# Patient Record
Sex: Male | Born: 1965 | Race: White | Hispanic: No | Marital: Married | Smoking: Never smoker
Health system: Southern US, Community
[De-identification: ages and names within clinical notes are randomized; demographics above are authoritative.]

## PROBLEM LIST (undated history)

## (undated) DIAGNOSIS — F411 Generalized anxiety disorder: Secondary | ICD-10-CM

## (undated) DIAGNOSIS — M7650 Patellar tendinitis, unspecified knee: Secondary | ICD-10-CM

## (undated) DIAGNOSIS — M25519 Pain in unspecified shoulder: Secondary | ICD-10-CM

## (undated) DIAGNOSIS — J309 Allergic rhinitis, unspecified: Secondary | ICD-10-CM

## (undated) DIAGNOSIS — Z8719 Personal history of other diseases of the digestive system: Secondary | ICD-10-CM

## (undated) DIAGNOSIS — K219 Gastro-esophageal reflux disease without esophagitis: Secondary | ICD-10-CM

## (undated) DIAGNOSIS — Z9889 Other specified postprocedural states: Secondary | ICD-10-CM

## (undated) DIAGNOSIS — J302 Other seasonal allergic rhinitis: Secondary | ICD-10-CM

## (undated) DIAGNOSIS — F528 Other sexual dysfunction not due to a substance or known physiological condition: Secondary | ICD-10-CM

## (undated) DIAGNOSIS — R112 Nausea with vomiting, unspecified: Secondary | ICD-10-CM

## (undated) DIAGNOSIS — M549 Dorsalgia, unspecified: Secondary | ICD-10-CM

## (undated) DIAGNOSIS — J45909 Unspecified asthma, uncomplicated: Secondary | ICD-10-CM

## (undated) DIAGNOSIS — M542 Cervicalgia: Secondary | ICD-10-CM

## (undated) DIAGNOSIS — I10 Essential (primary) hypertension: Secondary | ICD-10-CM

## (undated) DIAGNOSIS — E785 Hyperlipidemia, unspecified: Secondary | ICD-10-CM

## (undated) DIAGNOSIS — S335XXA Sprain of ligaments of lumbar spine, initial encounter: Secondary | ICD-10-CM

## (undated) DIAGNOSIS — F1910 Other psychoactive substance abuse, uncomplicated: Secondary | ICD-10-CM

## (undated) HISTORY — DX: Personal history of other diseases of the digestive system: Z87.19

## (undated) HISTORY — DX: Generalized anxiety disorder: F41.1

## (undated) HISTORY — DX: Other seasonal allergic rhinitis: J30.2

## (undated) HISTORY — DX: Unspecified asthma, uncomplicated: J45.909

## (undated) HISTORY — PX: KNEE SURGERY: SHX244

## (undated) HISTORY — DX: Allergic rhinitis, unspecified: J30.9

## (undated) HISTORY — DX: Cervicalgia: M54.2

## (undated) HISTORY — DX: Other psychoactive substance abuse, uncomplicated: F19.10

## (undated) HISTORY — DX: Other sexual dysfunction not due to a substance or known physiological condition: F52.8

## (undated) HISTORY — DX: Essential (primary) hypertension: I10

## (undated) HISTORY — DX: Gastro-esophageal reflux disease without esophagitis: K21.9

## (undated) HISTORY — DX: Sprain of ligaments of lumbar spine, initial encounter: S33.5XXA

## (undated) HISTORY — DX: Pain in unspecified shoulder: M25.519

## (undated) HISTORY — DX: Patellar tendinitis, unspecified knee: M76.50

## (undated) HISTORY — DX: Hyperlipidemia, unspecified: E78.5

## (undated) HISTORY — DX: Dorsalgia, unspecified: M54.9

---

## 1998-01-16 ENCOUNTER — Emergency Department (HOSPITAL_COMMUNITY): Admission: EM | Admit: 1998-01-16 | Discharge: 1998-01-16 | Payer: Self-pay | Admitting: Emergency Medicine

## 2000-03-17 ENCOUNTER — Encounter: Payer: Self-pay | Admitting: Emergency Medicine

## 2000-03-17 ENCOUNTER — Emergency Department (HOSPITAL_COMMUNITY): Admission: EM | Admit: 2000-03-17 | Discharge: 2000-03-17 | Payer: Self-pay | Admitting: Emergency Medicine

## 2005-07-11 ENCOUNTER — Ambulatory Visit: Payer: Self-pay | Admitting: Family Medicine

## 2006-06-09 ENCOUNTER — Ambulatory Visit: Payer: Self-pay | Admitting: Internal Medicine

## 2006-06-09 ENCOUNTER — Inpatient Hospital Stay (HOSPITAL_COMMUNITY): Admission: EM | Admit: 2006-06-09 | Discharge: 2006-06-14 | Payer: Self-pay | Admitting: Emergency Medicine

## 2006-06-17 ENCOUNTER — Ambulatory Visit: Payer: Self-pay | Admitting: Family Medicine

## 2006-06-19 ENCOUNTER — Ambulatory Visit: Payer: Self-pay | Admitting: Internal Medicine

## 2006-06-25 ENCOUNTER — Ambulatory Visit: Payer: Self-pay | Admitting: Licensed Clinical Social Worker

## 2006-07-01 ENCOUNTER — Ambulatory Visit: Payer: Self-pay | Admitting: Family Medicine

## 2006-07-15 ENCOUNTER — Ambulatory Visit: Payer: Self-pay | Admitting: Family Medicine

## 2006-07-22 ENCOUNTER — Ambulatory Visit: Payer: Self-pay | Admitting: Internal Medicine

## 2006-08-13 ENCOUNTER — Ambulatory Visit: Payer: Self-pay | Admitting: Family Medicine

## 2006-09-10 HISTORY — PX: KNEE ARTHROSCOPY: SUR90

## 2006-09-12 ENCOUNTER — Ambulatory Visit: Payer: Self-pay | Admitting: Internal Medicine

## 2006-09-12 ENCOUNTER — Encounter (INDEPENDENT_AMBULATORY_CARE_PROVIDER_SITE_OTHER): Payer: Self-pay | Admitting: Specialist

## 2006-09-17 ENCOUNTER — Ambulatory Visit: Payer: Self-pay | Admitting: Family Medicine

## 2006-10-23 ENCOUNTER — Ambulatory Visit: Payer: Self-pay | Admitting: Family Medicine

## 2007-05-13 DIAGNOSIS — J309 Allergic rhinitis, unspecified: Secondary | ICD-10-CM | POA: Insufficient documentation

## 2007-05-13 DIAGNOSIS — K219 Gastro-esophageal reflux disease without esophagitis: Secondary | ICD-10-CM | POA: Insufficient documentation

## 2007-06-10 ENCOUNTER — Ambulatory Visit: Payer: Self-pay | Admitting: Family Medicine

## 2007-07-21 ENCOUNTER — Ambulatory Visit: Payer: Self-pay | Admitting: Family Medicine

## 2007-10-01 ENCOUNTER — Ambulatory Visit: Payer: Self-pay | Admitting: Internal Medicine

## 2007-10-08 ENCOUNTER — Ambulatory Visit: Payer: Self-pay | Admitting: Family Medicine

## 2007-10-08 LAB — CONVERTED CEMR LAB
ALT: 49 units/L (ref 0–53)
Basophils Relative: 0.1 % (ref 0.0–1.0)
Bilirubin Urine: NEGATIVE
Bilirubin, Direct: 0.2 mg/dL (ref 0.0–0.3)
Blood in Urine, dipstick: NEGATIVE
CO2: 30 meq/L (ref 19–32)
Calcium: 9.6 mg/dL (ref 8.4–10.5)
Direct LDL: 149.8 mg/dL
Eosinophils Absolute: 0.2 10*3/uL (ref 0.0–0.6)
Eosinophils Relative: 3.7 % (ref 0.0–5.0)
GFR calc Af Amer: 106 mL/min
GFR calc non Af Amer: 88 mL/min
Glucose, Bld: 101 mg/dL — ABNORMAL HIGH (ref 70–99)
Glucose, Urine, Semiquant: 100
HCT: 45.4 % (ref 39.0–52.0)
Hemoglobin: 15.8 g/dL (ref 13.0–17.0)
Ketones, urine, test strip: NEGATIVE
Lymphocytes Relative: 25.4 % (ref 12.0–46.0)
MCV: 85.8 fL (ref 78.0–100.0)
Monocytes Absolute: 0.4 10*3/uL (ref 0.2–0.7)
Neutro Abs: 3.1 10*3/uL (ref 1.4–7.7)
Neutrophils Relative %: 63.2 % (ref 43.0–77.0)
Nitrite: NEGATIVE
Platelets: 160 10*3/uL (ref 150–400)
Protein, U semiquant: NEGATIVE
Sodium: 137 meq/L (ref 135–145)
Specific Gravity, Urine: >=1.03
Total Protein: 6.8 g/dL (ref 6.0–8.3)
Urobilinogen, UA: 0.2
VLDL: 39 mg/dL (ref 0–40)
WBC Urine, dipstick: NEGATIVE
WBC: 5 10*3/uL (ref 4.5–10.5)
pH: 5

## 2007-10-16 ENCOUNTER — Ambulatory Visit: Payer: Self-pay | Admitting: Family Medicine

## 2007-10-16 DIAGNOSIS — I10 Essential (primary) hypertension: Secondary | ICD-10-CM | POA: Insufficient documentation

## 2007-10-24 IMAGING — CT CT PELVIS W/ CM
2 of 5 series · 17 of 46 positions shown, 19 images · IV contrast (omnipaque)
Comparison: none

CLINICAL DATA: 40 year-old male with acute, severe, epigastric abdominal pain.  
ABDOMEN CT WITH CONTRAST:
TECHNIQUE: Multidetector CT imaging of the abdomen was performed following the standard protocol during bolus administration of intravenous contrast.
Contrast:  125 cc Omnipaque 300
TECHNIQUE: Multidetector CT imaging of the pelvis was performed following the standard protocol during bolus administration of intravenous contrast.
Free fluid extends into the pelvis dependently.  The distal colon is decompressed.  No adenopathy, bowel abnormality, or additional pelvic finding.

[Series 2: abd_pel 5.0 b40f st · axial · 0.73mm/px · z∈[-478,-23]mm · 14 of 103 slices shown, 16 images]
[im 6/103  soft-tissue]
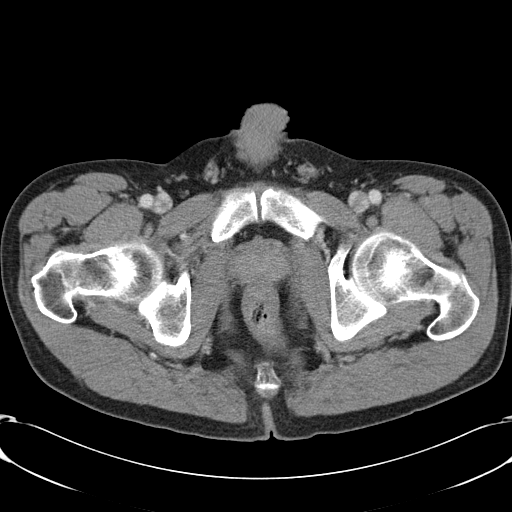
[im 6/103  bone]
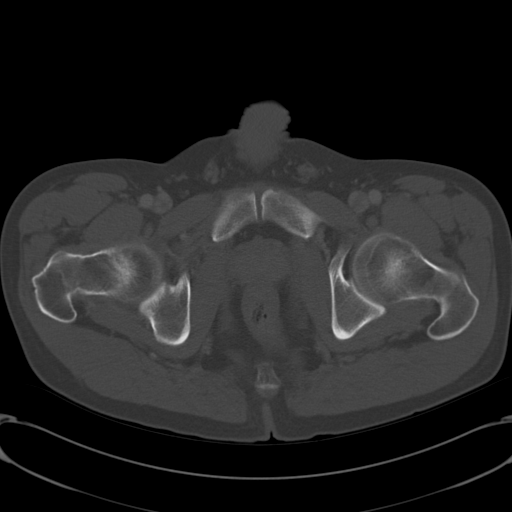
[im 11/103  soft-tissue]
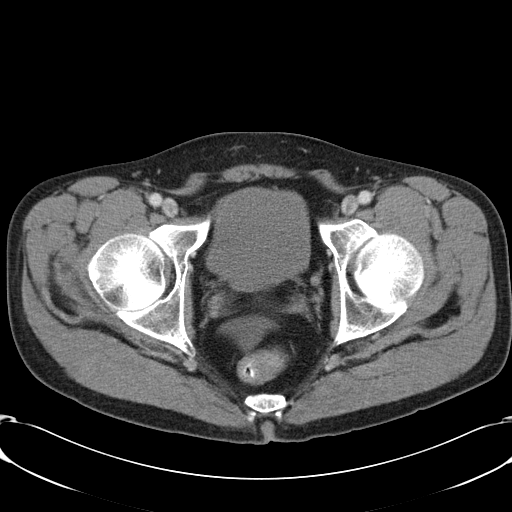
[im 22/103  soft-tissue]
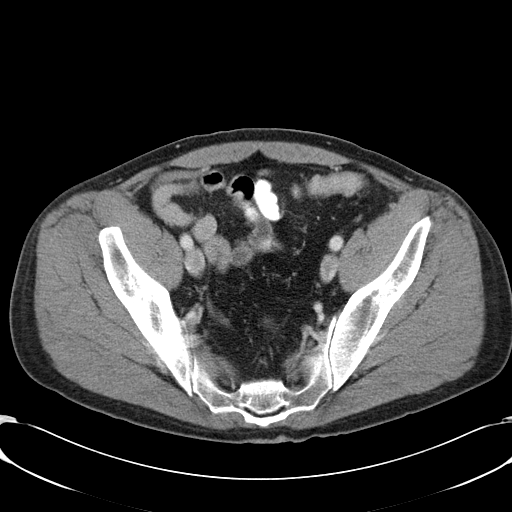
[im 27/103  soft-tissue]
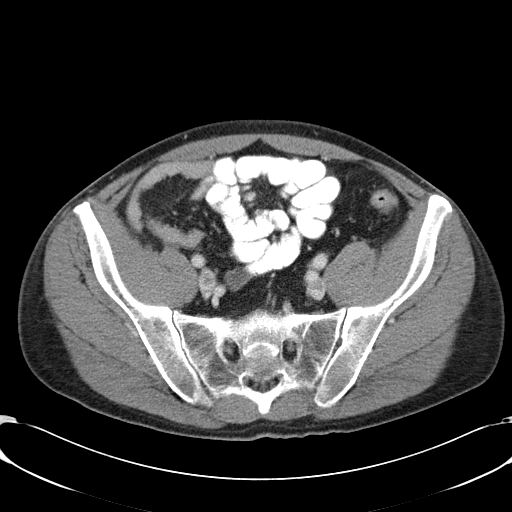
[im 33/103  soft-tissue]
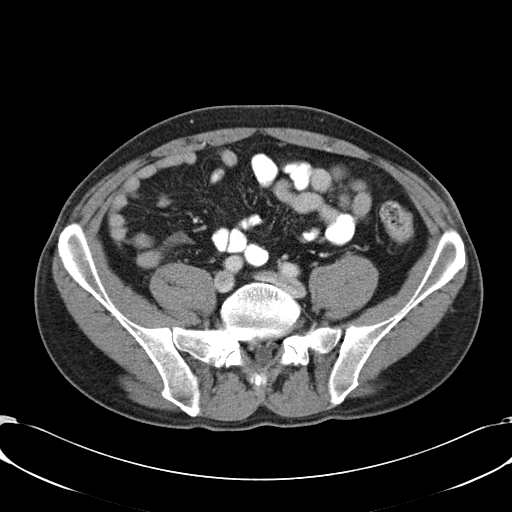
[im 43/103  soft-tissue]
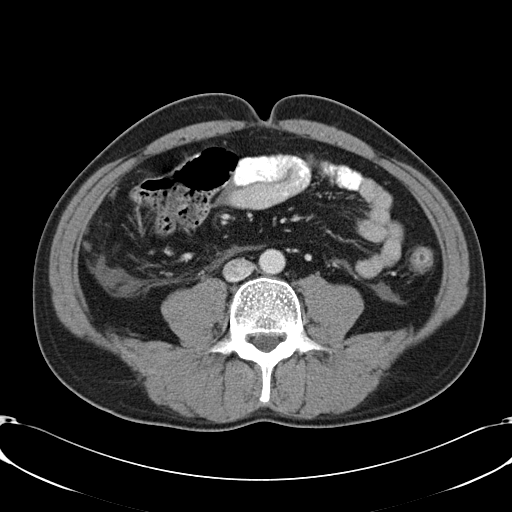
[im 49/103  soft-tissue]
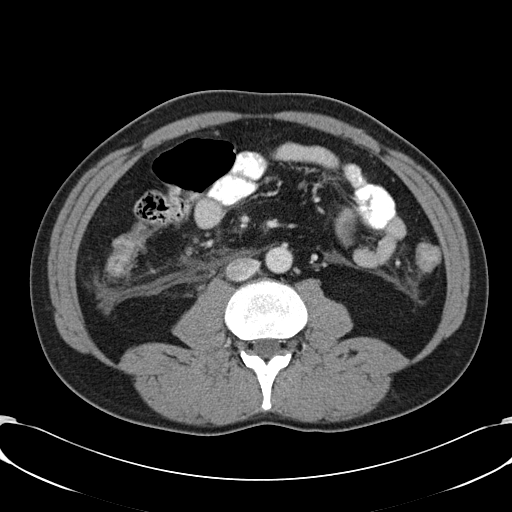
[im 54/103  soft-tissue]
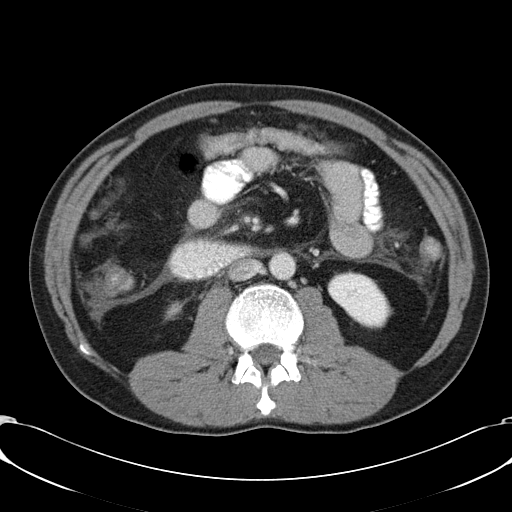
[im 60/103  soft-tissue]
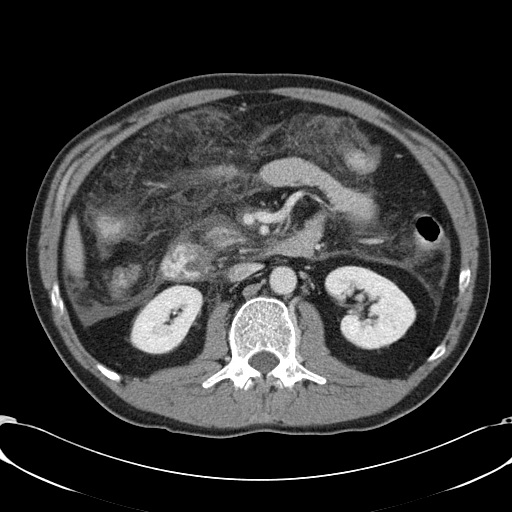
[im 60/103  bone]
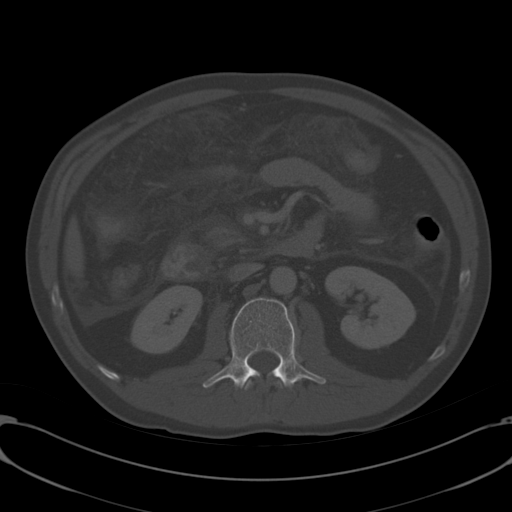
[im 70/103  soft-tissue]
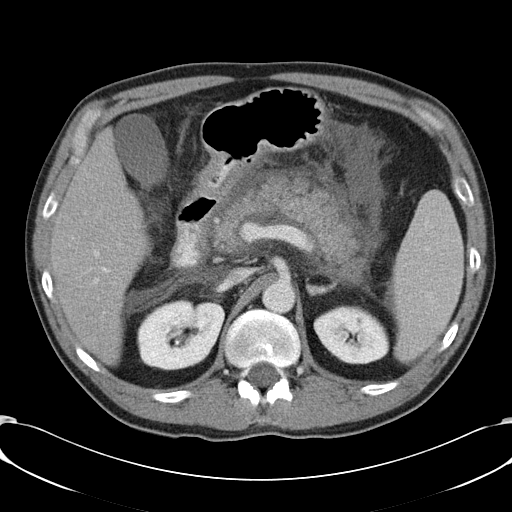
[im 76/103  soft-tissue]
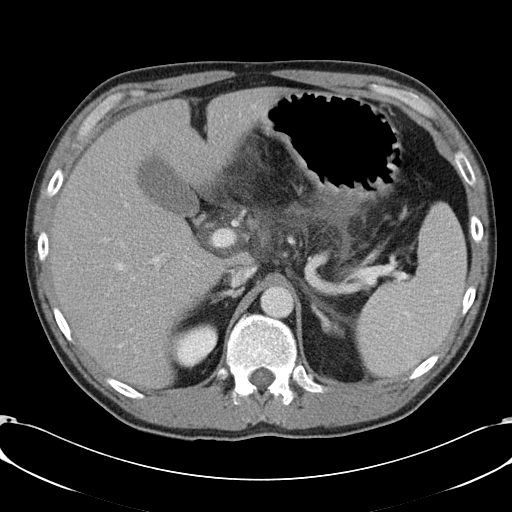
[im 81/103  soft-tissue]
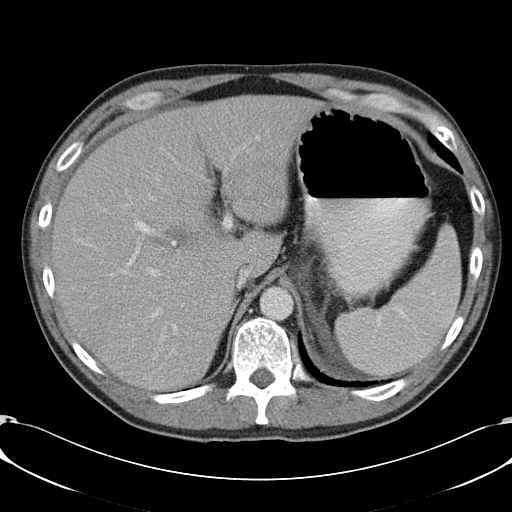
[im 92/103  soft-tissue]
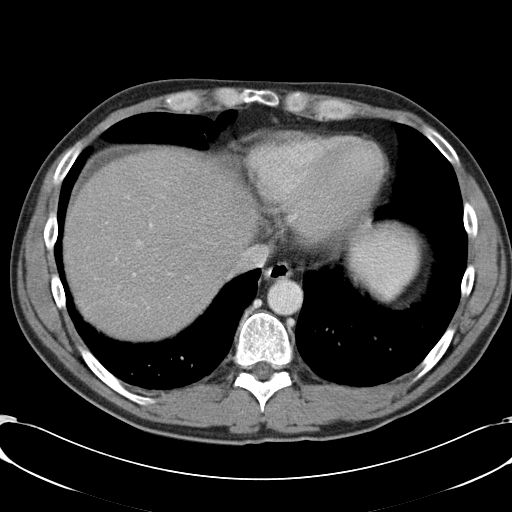
[im 97/103  soft-tissue]
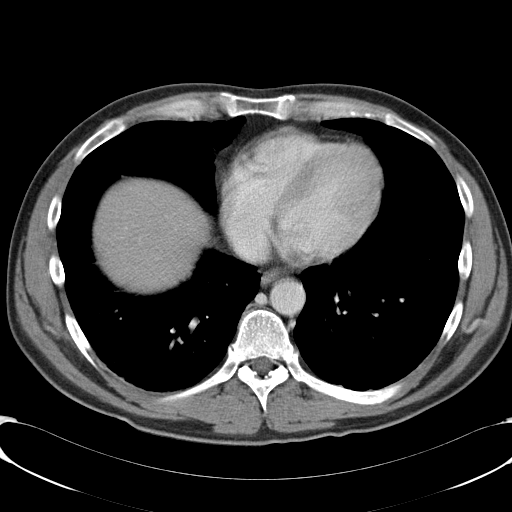

[Series 602: coronal abdomen · coronal · 1.04mm/px · 3 of 131 slices shown]
[im 44/131  soft-tissue]
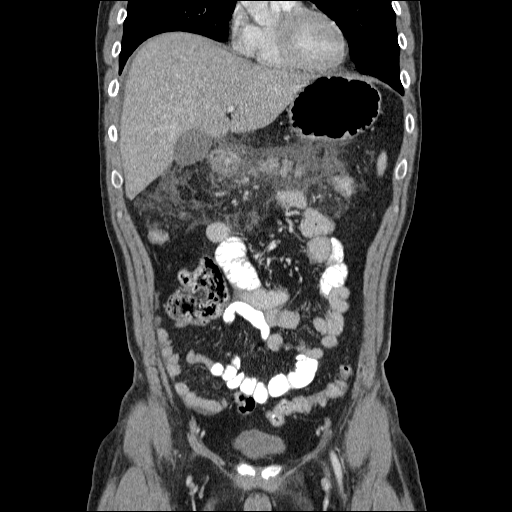
[im 58/131  soft-tissue]
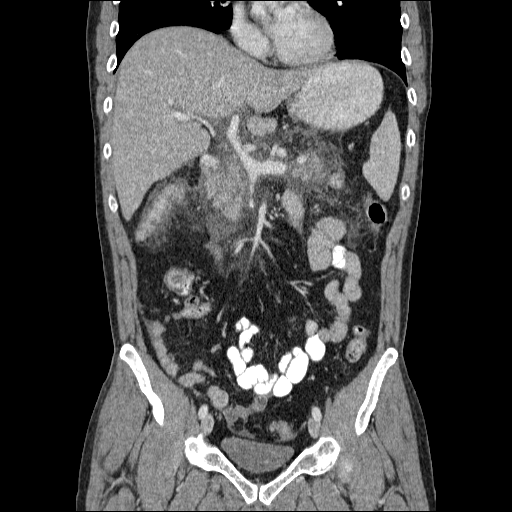
[im 73/131  soft-tissue]
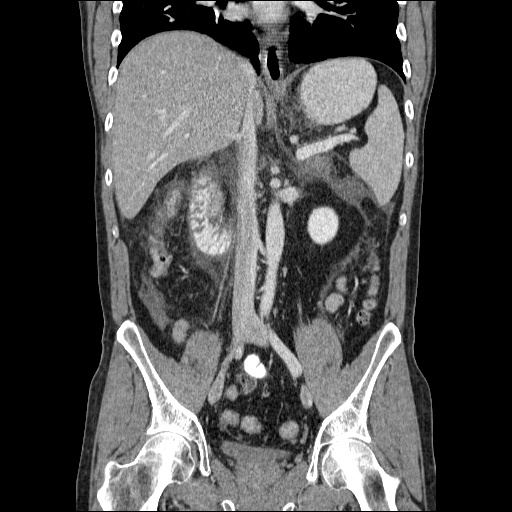

[17 of 46 positions shown; findings below may reference images not displayed]

FINDINGS: Minimal dependent bibasilar atelectasis.  No pericardial or pleural fluid.  Normal heart size.  
In the abdomen, there is a small amount of perihepatic and perisplenic ascites.  Diffuse inflammation and ill-defined edema/fluid is evident throughout the transverse mesocolon, mesentery, retroperitoneum and around the pancreas.  The appearance is consistent with acute pancreatitis.  No fluid collection, pseudocyst, or abscess.  Splenic artery and vein are patent.  No vascular abnormality.  Pancreas tissue enhances homogeneously without necrosis.  Adjacent thickening is evident of the stomach antropyloric region, and proximal duodenum.  The liver, gallbladder, biliary system, adrenal glands, kidneys, and spleen are normal.  Inflammation and edema extends along the paracolic gutters bilaterally.  No bowel obstruction.  The terminal ileum and appendix are normal.
IMPRESSION: 1.  Severe acute pancreatitis with fluid, inflammation and edema throughout the retroperitoneum, mesentery, and transverse mesocolon.
2.  Small amount of upper abdominal free fluid.  
3.  Adjacent thickening of the distal stomach and proximal duodenum.
PELVIS CT WITH CONTRAST:
IMPRESSION: Free pelvic fluid.

## 2008-07-15 ENCOUNTER — Ambulatory Visit: Payer: Self-pay | Admitting: Family Medicine

## 2008-10-08 ENCOUNTER — Ambulatory Visit: Payer: Self-pay | Admitting: Family Medicine

## 2008-10-08 DIAGNOSIS — I10 Essential (primary) hypertension: Secondary | ICD-10-CM | POA: Insufficient documentation

## 2008-10-08 DIAGNOSIS — F528 Other sexual dysfunction not due to a substance or known physiological condition: Secondary | ICD-10-CM | POA: Insufficient documentation

## 2008-10-25 ENCOUNTER — Ambulatory Visit: Payer: Self-pay | Admitting: Family Medicine

## 2008-10-25 DIAGNOSIS — J45909 Unspecified asthma, uncomplicated: Secondary | ICD-10-CM | POA: Insufficient documentation

## 2008-10-25 LAB — CONVERTED CEMR LAB
ALT: 30 units/L (ref 0–53)
AST: 22 units/L (ref 0–37)
Albumin: 4.3 g/dL (ref 3.5–5.2)
Basophils Relative: 0.2 % (ref 0.0–3.0)
Cholesterol: 199 mg/dL (ref 0–200)
HCT: 45.6 % (ref 39.0–52.0)
Hemoglobin: 15.8 g/dL (ref 13.0–17.0)
LDL Cholesterol: 136 mg/dL — ABNORMAL HIGH (ref 0–99)
Lymphocytes Relative: 25.7 % (ref 12.0–46.0)
MCHC: 34.7 g/dL (ref 30.0–36.0)
Monocytes Absolute: 0.3 10*3/uL (ref 0.1–1.0)
Monocytes Relative: 5.5 % (ref 3.0–12.0)
Neutro Abs: 3.2 10*3/uL (ref 1.4–7.7)
RBC: 5.18 M/uL (ref 4.22–5.81)
TSH: 2.56 microintl units/mL (ref 0.35–5.50)
Total CHOL/HDL Ratio: 6.1

## 2008-11-05 ENCOUNTER — Ambulatory Visit: Payer: Self-pay | Admitting: Family Medicine

## 2009-01-20 ENCOUNTER — Ambulatory Visit: Payer: Self-pay | Admitting: Family Medicine

## 2009-01-29 ENCOUNTER — Ambulatory Visit: Payer: Self-pay | Admitting: Family Medicine

## 2009-01-31 ENCOUNTER — Telehealth (INDEPENDENT_AMBULATORY_CARE_PROVIDER_SITE_OTHER): Payer: Self-pay | Admitting: *Deleted

## 2009-01-31 ENCOUNTER — Ambulatory Visit: Payer: Self-pay | Admitting: Family Medicine

## 2009-02-04 ENCOUNTER — Ambulatory Visit: Payer: Self-pay | Admitting: Family Medicine

## 2009-02-10 ENCOUNTER — Telehealth: Payer: Self-pay | Admitting: Family Medicine

## 2009-03-11 ENCOUNTER — Ambulatory Visit: Payer: Self-pay | Admitting: Family Medicine

## 2009-05-02 ENCOUNTER — Ambulatory Visit: Payer: Self-pay | Admitting: Family Medicine

## 2009-07-06 ENCOUNTER — Ambulatory Visit: Payer: Self-pay | Admitting: Family Medicine

## 2009-08-22 ENCOUNTER — Ambulatory Visit: Payer: Self-pay | Admitting: Family Medicine

## 2009-08-29 ENCOUNTER — Ambulatory Visit: Payer: Self-pay | Admitting: Family Medicine

## 2009-11-25 ENCOUNTER — Ambulatory Visit: Payer: Self-pay | Admitting: Family Medicine

## 2010-01-13 ENCOUNTER — Ambulatory Visit: Payer: Self-pay | Admitting: Family Medicine

## 2010-01-13 LAB — CONVERTED CEMR LAB
ALT: 29 units/L (ref 0–53)
Bilirubin Urine: NEGATIVE
CO2: 30 meq/L (ref 19–32)
Calcium: 9.1 mg/dL (ref 8.4–10.5)
Chloride: 104 meq/L (ref 96–112)
Eosinophils Relative: 3.1 % (ref 0.0–5.0)
Glucose, Bld: 100 mg/dL — ABNORMAL HIGH (ref 70–99)
HCT: 46.4 % (ref 39.0–52.0)
Hemoglobin, Urine: NEGATIVE
Leukocytes, UA: NEGATIVE
Lymphs Abs: 1.4 10*3/uL (ref 0.7–4.0)
MCHC: 35.4 g/dL (ref 30.0–36.0)
MCV: 87.2 fL (ref 78.0–100.0)
Monocytes Absolute: 0.3 10*3/uL (ref 0.1–1.0)
Nitrite: NEGATIVE
Platelets: 162 10*3/uL (ref 150.0–400.0)
Potassium: 4.4 meq/L (ref 3.5–5.1)
RDW: 12.9 % (ref 11.5–14.6)
TSH: 2.42 microintl units/mL (ref 0.35–5.50)
Total Bilirubin: 0.8 mg/dL (ref 0.3–1.2)
pH: 5 (ref 5.0–8.0)

## 2010-01-17 ENCOUNTER — Ambulatory Visit: Payer: Self-pay | Admitting: Family Medicine

## 2010-01-17 DIAGNOSIS — E785 Hyperlipidemia, unspecified: Secondary | ICD-10-CM | POA: Insufficient documentation

## 2010-03-08 ENCOUNTER — Ambulatory Visit: Payer: Self-pay | Admitting: Family Medicine

## 2010-03-17 ENCOUNTER — Ambulatory Visit: Payer: Self-pay | Admitting: Family Medicine

## 2010-03-17 LAB — CONVERTED CEMR LAB
AST: 18 units/L (ref 0–37)
Alkaline Phosphatase: 51 units/L (ref 39–117)
Bilirubin, Direct: 0.1 mg/dL (ref 0.0–0.3)
Cholesterol: 184 mg/dL (ref 0–200)
Total Bilirubin: 0.8 mg/dL (ref 0.3–1.2)
Total CHOL/HDL Ratio: 5

## 2010-03-21 ENCOUNTER — Ambulatory Visit: Payer: Self-pay | Admitting: Family Medicine

## 2010-08-25 ENCOUNTER — Telehealth: Payer: Self-pay | Admitting: Family Medicine

## 2010-09-15 ENCOUNTER — Ambulatory Visit
Admission: RE | Admit: 2010-09-15 | Discharge: 2010-09-15 | Payer: Self-pay | Source: Home / Self Care | Attending: Family Medicine | Admitting: Family Medicine

## 2010-09-15 ENCOUNTER — Encounter: Payer: Self-pay | Admitting: Family Medicine

## 2010-09-18 LAB — CONVERTED CEMR LAB
BUN: 18 mg/dL (ref 6–23)
Basophils Relative: 1 % (ref 0–1)
Calcium: 9.2 mg/dL (ref 8.4–10.5)
Creatinine, Ser: 0.85 mg/dL (ref 0.40–1.50)
Eosinophils Absolute: 0.1 10*3/uL (ref 0.0–0.7)
Eosinophils Relative: 2 % (ref 0–5)
Glucose, Bld: 104 mg/dL — ABNORMAL HIGH (ref 70–99)
HCT: 42.6 % (ref 39.0–52.0)
Lymphs Abs: 1.7 10*3/uL (ref 0.7–4.0)
MCHC: 36.2 g/dL — ABNORMAL HIGH (ref 30.0–36.0)
MCV: 83.9 fL (ref 78.0–100.0)
Monocytes Absolute: 0.4 10*3/uL (ref 0.1–1.0)
Monocytes Relative: 8 % (ref 3–12)
Potassium: 4.3 meq/L (ref 3.5–5.3)
RBC: 5.08 M/uL (ref 4.22–5.81)
Testosterone: 238.87 ng/dL — ABNORMAL LOW (ref 250–890)
Uric Acid, Serum: 5.1 mg/dL (ref 4.0–7.8)
WBC: 5.4 10*3/uL (ref 4.0–10.5)

## 2010-09-19 ENCOUNTER — Ambulatory Visit
Admission: RE | Admit: 2010-09-19 | Discharge: 2010-09-19 | Payer: Self-pay | Source: Home / Self Care | Attending: Family Medicine | Admitting: Family Medicine

## 2010-09-19 DIAGNOSIS — E291 Testicular hypofunction: Secondary | ICD-10-CM | POA: Insufficient documentation

## 2010-10-04 ENCOUNTER — Telehealth: Payer: Self-pay | Admitting: Family Medicine

## 2010-10-07 ENCOUNTER — Encounter: Payer: Self-pay | Admitting: Family Medicine

## 2010-10-10 NOTE — Assessment & Plan Note (Signed)
Summary: BACK PAIN // RS   Vital Signs:  Patient profile:   46 year old male Weight:      220 pounds BP sitting:   140 / 90  (left arm) Cuff size:   regular  Vitals Entered By: Raechel Ache, RN (March 08, 2010 4:02 PM) CC: C/o LBP.   History of Present Illness: here for 5 days of stiffness and pain in the right lower back. he had been doing a lot of heavy lifting when this started. No pain in the legs. Heat and Advil help.   Allergies: No Known Drug Allergies  Past History:  Past Medical History: Reviewed history from 10/25/2008 and no changes required. Allergic rhinitis Anxiety GERD Pancreatitis, hx of etohism ED Hypertension Asthma  Past Surgical History: Reviewed history from 05/13/2007 and no changes required. Panendoscopy  Review of Systems  The patient denies anorexia, fever, weight loss, weight gain, vision loss, decreased hearing, hoarseness, chest pain, syncope, dyspnea on exertion, peripheral edema, prolonged cough, headaches, hemoptysis, abdominal pain, melena, hematochezia, severe indigestion/heartburn, hematuria, incontinence, genital sores, muscle weakness, suspicious skin lesions, transient blindness, difficulty walking, depression, unusual weight change, abnormal bleeding, enlarged lymph nodes, angioedema, breast masses, and testicular masses.    Physical Exam  General:  Well-developed,well-nourished,in no acute distress; alert,appropriate and cooperative throughout examination Msk:  mildly tender in the lower back and over the right sciatic notch. Some spasm is present. ROM is full   Impression & Recommendations:  Problem # 1:  LUMBAR STRAIN (ICD-847.2)  Complete Medication List: 1)  Flonase 50 Mcg/act Susp (Fluticasone propionate) .... Uad 2)  Triamcinolone Acetonide 0.025 % Crea (Triamcinolone acetonide) .... Use twice daily 3)  Claritin-d 12 Hour 5-120 Mg Xr12h-tab (Loratadine-pseudoephedrine) .... Once daily 4)  Prilosec Otc 20 Mg Tbec  (Omeprazole magnesium) .... Once daily 5)  Proventil Hfa 108 (90 Base) Mcg/act Aers (Albuterol sulfate) .... 2 ps qid 6)  Cozaar 50 Mg Tabs (Losartan potassium) .... Take 1 tablet by mouth every morning 7)  Zocor 10 Mg Tabs (Simvastatin) .Marland Kitchen.. 1 tab @ bedtime 8)  Prednisone (pak) 10 Mg Tabs (Prednisone) .... As directed for 6 days 9)  Flexeril 10 Mg Tabs (Cyclobenzaprine hcl) .... Three times a day as needed spasm  Patient Instructions: 1)  rest, heat. Predisone taper pack and Flexeril as needed .  2)  Please schedule a follow-up appointment as needed .  Prescriptions: FLEXERIL 10 MG TABS (CYCLOBENZAPRINE HCL) three times a day as needed spasm  #60 x 2   Entered and Authorized by:   Nelwyn Salisbury MD   Signed by:   Nelwyn Salisbury MD on 03/08/2010   Method used:   Electronically to        [facility]  [phone][facility] (retail)       [address] [place], [place]  [address]       Ph: [phone] or [phone]       Fax: [phone]   RxID:   [id number] PREDNISONE (PAK) 10 MG TABS (PREDNISONE) as directed for 6 days  #1 x 0   Entered and Authorized by:   Nelwyn Salisbury MD   Signed by:   Nelwyn Salisbury MD on 03/08/2010   Method used:   Electronically to        [facility]  [phone]* (retail)       [address] [place], [place]  [address]  Ph: [phone] or [phone]       Fax: [phone]   RxID:   [id number]

## 2010-10-10 NOTE — Assessment & Plan Note (Signed)
Summary: swollen area below rt knee/painful/cjr   Vital Signs:  Patient profile:   45 year old male Height:      75 inches Weight:      227 pounds BMI:     28.48 Temp:     98.6 degrees F oral BP sitting:   110 / 80  Vitals Entered By: Kern Reap CMA Duncan Dull) (November 25, 2009 4:17 PM)  Reason for Visit right leg swelling  History of Present Illness: [name] is a 45 year old single male, who comes in today for evaluation of pain and swelling of his right knee.  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  On Wednesday he noticed pain and he points to his right patellar tendon as it inserts on the tibia as a source of his pain.  No history of trauma other than above  Allergies: No Known Drug Allergies  Review of Systems      See HPI  Physical Exam  General:  Well-developed,well-nourished,in no acute distress; alert,appropriate and cooperative throughout examination Msk:  left knee normal right knee swollen, tender at the patellar tendon   Impression & Recommendations:  Problem # 1:  TENDINITIS, PATELLAR (ICD-726.64) Assessment New  Orders: Prescription Created Electronically [phone])  Complete Medication List: 1)  Paxil 20 Mg Tabs (Paroxetine hcl) .... Take 1 tablet by mouth every morning 2)  Zestril 10 Mg Tabs (Lisinopril) .... Once daily 3)  Flonase 50 Mcg/act Susp (Fluticasone propionate) .... Uad 4)  Triamcinolone Acetonide 0.025 % Crea (Triamcinolone acetonide) .... Use twice daily 5)  Claritin-d 12 Hour 5-120 Mg Xr12h-tab (Loratadine-pseudoephedrine) .... Once daily 6)  Prilosec Otc 20 Mg Tbec (Omeprazole magnesium) .... Once daily 7)  Proventil Hfa 108 (90 Base) Mcg/act Aers (Albuterol sulfate) .... 2 ps qid 8)  Cozaar 25 Mg Tabs (Losartan potassium) .... Take 1 tablet by mouth every morning 9)  Cozaar 50 Mg Tabs (Losartan potassium) .... Take 1 tablet by mouth every morning 10)  Prednisone 20 Mg Tabs (Prednisone) .... Uad  Patient  Instructions: 1)  elevation and ice every opportunity again. 2)  Start prednisone, take two tabs now then starting tomorrow morning, two tabs every morning x 3 days.....Marland Kitchen one tab x 3 days,......... a half x 3 days,.... then half a tablet Monday, Wednesday, Friday, for a two week taper. 3)  If you don't see any improvement in the next couple days.  Call Dr. Norlene Campbell, orthopedist Prescriptions: PREDNISONE 20 MG TABS (PREDNISONE) UAD  #30 x 1   Entered and Authorized by:   Roderick Pee MD   Signed by:   Roderick Pee MD on 11/25/2009   Method used:   Electronically to        [facility]  [phone]* (retail)       [address] [place], [place]  [address]       Ph: [phone] or [phone]       Fax: [phone]   RxID:   [id number]

## 2010-10-10 NOTE — Assessment & Plan Note (Signed)
Summary: cpx/njr   Vital Signs:  Patient profile:   45 year old male Height:      73 inches Weight:      222 pounds Temp:     98.6 degrees F oral BP sitting:   120 / 98  (left arm) Cuff size:   regular  Vitals Entered By: Kern Reap CMA Duncan Dull) (Jan 17, 2010 3:43 PM) CC: cpx Is Patient Diabetic? No Pain Assessment Patient in pain? no        CC:  cpx.  History of Present Illness: [name] is a 45 year old male, nonsmoker, nondrinker, who comes in today for evaluation of hypertension, allergic rhinitis and asthma, mild depression eczema.  His hypertension is treated with Cozaar 50 mg daily.  BP today 120/98.  Asked him to check her BP daily for two weeks and fax me the data.  If still elevated at home.  Will increase Cozaar to 75 mg daily.  He has allergic rhinitis, for which he uses Flonase nasal spray Claritin OTC and occasional wheezing.  He uses Proventil p.r.n.  Medications worked well the spring.  He also takes Paxil 20 mg daily.  Mood is good functioning well.  For eczema.  He uses triamcinolone cream .025% p.r.n.    Tetanus 2007 seasonal flu 2010  in reviewing his labs his LDL and total cholesterol markedly elevated for the first time.  Positive family history of hyperlipidemia  Allergies (verified): No Known Drug Allergies  Past History:  Past medical, surgical, family and social histories (including risk factors) reviewed, and no changes noted (except as noted below).  Past Medical History: Reviewed history from 10/25/2008 and no changes required. Allergic rhinitis Anxiety GERD Pancreatitis, hx of etohism ED Hypertension Asthma  Past Surgical History: Reviewed history from 05/13/2007 and no changes required. Panendoscopy  Family History: Reviewed history from 05/13/2007 and no changes required. Family History of Melanoma Family History of Respiratory disease  Social History: Reviewed history from 07/15/2008 and no changes required. Never  Smoked Alcohol use-no Divorced Occupation:carpenter recently remarried 3 weeks ago  Review of Systems      See HPI  Physical Exam  General:  Well-developed,well-nourished,in no acute distress; alert,appropriate and cooperative throughout examination Head:  Normocephalic and atraumatic without obvious abnormalities. No apparent alopecia or balding. Eyes:  No corneal or conjunctival inflammation noted. EOMI. Perrla. Funduscopic exam benign, without hemorrhages, exudates or papilledema. Vision grossly normal. Ears:  External ear exam shows no significant lesions or deformities.  Otoscopic examination reveals clear canals, tympanic membranes are intact bilaterally without bulging, retraction, inflammation or discharge. Hearing is grossly normal bilaterally. Nose:  External nasal examination shows no deformity or inflammation. Nasal mucosa are pink and moist without lesions or exudates. Mouth:  Oral mucosa and oropharynx without lesions or exudates.  Teeth in good repair. Neck:  No deformities, masses, or tenderness noted. Chest Wall:  No deformities, masses, tenderness or gynecomastia noted. Breasts:  No masses or gynecomastia noted Lungs:  Normal respiratory effort, chest expands symmetrically. Lungs are clear to auscultation, no crackles or wheezes. Heart:  Normal rate and regular rhythm. S1 and S2 normal without gallop, murmur, click, rub or other extra sounds. Abdomen:  Bowel sounds positive,abdomen soft and non-tender without masses, organomegaly or hernias noted. Rectal:  No external abnormalities noted. Normal sphincter tone. No rectal masses or tenderness. Genitalia:  Testes bilaterally descended without nodularity, tenderness or masses. No scrotal masses or lesions. No penis lesions or urethral discharge. Prostate:  Prostate gland firm and smooth, no enlargement,  nodularity, tenderness, mass, asymmetry or induration. Msk:  No deformity or scoliosis noted of thoracic or lumbar spine.     Pulses:  R and L carotid,radial,femoral,dorsalis pedis and posterior tibial pulses are full and equal bilaterally Extremities:  No clubbing, cyanosis, edema, or deformity noted with normal full range of motion of all joints.   Neurologic:  No cranial nerve deficits noted. Station and gait are normal. Plantar reflexes are down-going bilaterally. DTRs are symmetrical throughout. Sensory, motor and coordinative functions appear intact. Skin:  Intact without suspicious lesions or rashes Cervical Nodes:  No lymphadenopathy noted Axillary Nodes:  No palpable lymphadenopathy Inguinal Nodes:  No significant adenopathy Psych:  Cognition and judgment appear intact. Alert and cooperative with normal attention span and concentration. No apparent delusions, illusions, hallucinations   Problems:  Medical Problems Added: 1)  Dx of Hyperlipidemia  (ICD-272.4)  Impression & Recommendations:  Problem # 1:  HYPERTENSION (ICD-401.9) Assessment Improved  The following medications were removed from the medication list:    Zestril 10 Mg Tabs (Lisinopril) ..... Once daily    Cozaar 25 Mg Tabs (Losartan potassium) .Marland Kitchen... Take 1 tablet by mouth every morning His updated medication list for this problem includes:    Cozaar 50 Mg Tabs (Losartan potassium) .Marland Kitchen... Take 1 tablet by mouth every morning  Problem # 2:  HEALTH SCREENING (ICD-V70.0) Assessment: Unchanged  Orders: Prescription Created Electronically [phone])  Problem # 3:  ANXIETY (ICD-300.00) Assessment: Improved  The following medications were removed from the medication list:    Paxil 20 Mg Tabs (Paroxetine hcl) .Marland Kitchen... Take 1 tablet by mouth every morning  Orders: Prescription Created Electronically [phone])  Problem # 4:  ALLERGIC RHINITIS (ICD-477.9) Assessment: Improved  His updated medication list for this problem includes:    Flonase 50 Mcg/act Susp (Fluticasone propionate) ..... Uad  Orders: Prescription Created Electronically  [phone])  Problem # 7:  HYPERLIPIDEMIA (ICD-272.4) Assessment: New  His updated medication list for this problem includes:    Zocor 10 Mg Tabs (Simvastatin) .Marland Kitchen... 1 tab @ bedtime  Complete Medication List: 1)  Flonase 50 Mcg/act Susp (Fluticasone propionate) .... Uad 2)  Triamcinolone Acetonide 0.025 % Crea (Triamcinolone acetonide) .... Use twice daily 3)  Claritin-d 12 Hour 5-120 Mg Xr12h-tab (Loratadine-pseudoephedrine) .... Once daily 4)  Prilosec Otc 20 Mg Tbec (Omeprazole magnesium) .... Once daily 5)  Proventil Hfa 108 (90 Base) Mcg/act Aers (Albuterol sulfate) .... 2 ps qid 6)  Cozaar 50 Mg Tabs (Losartan potassium) .... Take 1 tablet by mouth every morning 7)  Prednisone 20 Mg Tabs (Prednisone) .... Uad 8)  Zocor 10 Mg Tabs (Simvastatin) .Marland Kitchen.. 1 tab @ bedtime  Other Orders: EKG w/ Interpretation (93000)  Patient Instructions: 1)  continue your current treatment program return in one year for follow-up, sooner if any problems 2)  Please schedule a follow-up appointment in 2 months. 3)  Hepatic Panel prior to visit, ICD-9:.......272.00 4)  Lipid Panel prior to visit, ICD-9: 5)  begin Zocor 10 mg nightly Prescriptions: ZOCOR 10 MG TABS (SIMVASTATIN) 1 tab @ bedtime  #100 x 3   Entered and Authorized by:   Roderick Pee MD   Signed by:   Roderick Pee MD on 01/17/2010   Method used:   Electronically to        [facility]  [phone]* (retail)       [address] [place], [place]  [address]       Ph: [phone] or  [phone]       Fax: [phone]   RxID:   [id number] COZAAR 50 MG TABS (LOSARTAN POTASSIUM) Take 1 tablet by mouth every morning  #100 x 3   Entered and Authorized by:   Roderick Pee MD   Signed by:   Roderick Pee MD on 01/17/2010   Method used:   Electronically to        [facility]  [phone]* (retail)       [address] [place], [place]  [address]       Ph: [phone] or [phone]       Fax: [phone]   RxID:    [id number] PROVENTIL HFA 108 (90 BASE) MCG/ACT AERS (ALBUTEROL SULFATE) 2 ps qid  #1 x 1   Entered and Authorized by:   Roderick Pee MD   Signed by:   Roderick Pee MD on 01/17/2010   Method used:   Electronically to        [facility]  [phone]* (retail)       [address] [place], [place]  [address]       Ph: [phone] or [phone]       Fax: [phone]   RxID:   [id number] TRIAMCINOLONE ACETONIDE 0.025 %  CREA (TRIAMCINOLONE ACETONIDE) use twice daily  #60 gr x 3   Entered and Authorized by:   Roderick Pee MD   Signed by:   Roderick Pee MD on 01/17/2010   Method used:   Electronically to        [facility]  [phone]* (retail)       [address] [place], [place]  [address]       Ph: [phone] or [phone]       Fax: [phone]   RxID:   [id number] FLONASE 50 MCG/ACT  SUSP (FLUTICASONE PROPIONATE) uad  #16 Gram x 5   Entered and Authorized by:   Roderick Pee MD   Signed by:   Roderick Pee MD on 01/17/2010   Method used:   Electronically to        [facility]  [phone]* (retail)       [address] [place], [place]  [address]       Ph: [phone] or [phone]       Fax: [phone]   RxID:   [id number]

## 2010-10-10 NOTE — Assessment & Plan Note (Signed)
Summary: 2 month fup/ccm   Vital Signs:  Patient profile:   45 year old male Weight:      224 pounds Temp:     98.2 degrees F oral BP sitting:   124 / 94  (left arm) Cuff size:   regular  Vitals Entered By: Kern Reap CMA Duncan Dull) (March 21, 2010 3:07 PM) CC: 2 month follow up   CC:  2 month follow up.  History of Present Illness: [name] is a 45 year old, married male, nonsmoker, who comes in today for evaluation of hyperlipidemia.  His triglyceride level was markedly elevated.  He is altered his diet.  We started Zocor 10 nightly and his total and LDL dropped remarkably.  Will continue that dose.  No side effects from medication   He is also requesting some medication for, sleep.  He's got a long plane trip a mission trip to [place]  Allergies: No Known Drug Allergies  Past History:  Past medical, surgical, family and social histories (including risk factors) reviewed for relevance to current acute and chronic problems.  Past Medical History: Reviewed history from 10/25/2008 and no changes required. Allergic rhinitis Anxiety GERD Pancreatitis, hx of etohism ED Hypertension Asthma  Past Surgical History: Reviewed history from 05/13/2007 and no changes required. Panendoscopy  Family History: Reviewed history from 05/13/2007 and no changes required. Family History of Melanoma Family History of Respiratory disease  Social History: Reviewed history from 07/15/2008 and no changes required. Never Smoked Alcohol use-no Divorced Occupation:carpenter recently remarried 3 weeks ago  Review of Systems      See HPI  Physical Exam  General:  Well-developed,well-nourished,in no acute distress; alert,appropriate and cooperative throughout examination   Impression & Recommendations:  Problem # 1:  HYPERLIPIDEMIA (ICD-272.4) Assessment Improved  His updated medication list for this problem includes:    Zocor 10 Mg Tabs (Simvastatin) .Marland Kitchen... 1 tab @  bedtime  Complete Medication List: 1)  Flonase 50 Mcg/act Susp (Fluticasone propionate) .... Uad 2)  Triamcinolone Acetonide 0.025 % Crea (Triamcinolone acetonide) .... Use twice daily 3)  Claritin-d 12 Hour 5-120 Mg Xr12h-tab (Loratadine-pseudoephedrine) .... Once daily 4)  Prilosec Otc 20 Mg Tbec (Omeprazole magnesium) .... Once daily 5)  Proventil Hfa 108 (90 Base) Mcg/act Aers (Albuterol sulfate) .... 2 ps qid 6)  Cozaar 50 Mg Tabs (Losartan potassium) .... Take 1 tablet by mouth every morning 7)  Zocor 10 Mg Tabs (Simvastatin) .Marland Kitchen.. 1 tab @ bedtime 8)  Flexeril 10 Mg Tabs (Cyclobenzaprine hcl) .... Three times a day as needed spasm 9)  Ativan 0.5 Mg Tabs (Lorazepam) .Marland Kitchen.. 1 tab @ bedtime as needed  Patient Instructions: 1)  continue the current medication, one daily, follow-up yearly. 2)  Begin one adult aspirin tablet daily remember the instructions about exercise during flight, Ativan, .5 p.r.n. for sleep Prescriptions: ATIVAN 0.5 MG TABS (LORAZEPAM) 1 tab @ bedtime as needed  #30 x 0   Entered and Authorized by:   Roderick Pee MD   Signed by:   Roderick Pee MD on 03/21/2010   Method used:   Print then Give to Patient   RxID:   [id number]

## 2010-10-12 NOTE — Assessment & Plan Note (Signed)
Summary: pain in shoulder/swollen big toe/ok to work in per rv/cjr   Vital Signs:  Patient profile:   45 year old male Height:      73 inches Weight:      230 pounds BMI:     30.45 Temp:     98.5 degrees F oral BP sitting:   110 / 80  (left arm) Cuff size:   regular  Vitals Entered By: Kern Reap CMA Duncan Dull) (September 15, 2010 4:38 PM) CC: right grand toe swollen, left shoulder pain   CC:  right grand toe swollen and left shoulder pain.  History of Present Illness: [name] is a 45 year old male, who comes in today for evaluation of 3 problems.  He said pain in his right great toe for about 4 weeks.  No history of trauma.  It's been sore and swollen.  Occasionally gets red and hot.  Positive family history gout.  He's also had discomfort in his left shoulder for about two weeks.  Again, no history trauma.  He's also had some constant low back pain for about 3 weeks.  Again, no history of trauma.  He describes the back pain is constant, dull, a 4 on a scale of one to 10, and it does not radiate.  Allergies: No Known Drug Allergies  Past History:  Past medical, surgical, family and social histories (including risk factors) reviewed for relevance to current acute and chronic problems.  Past Medical History: Reviewed history from 10/25/2008 and no changes required. Allergic rhinitis Anxiety GERD Pancreatitis, hx of etohism ED Hypertension Asthma  Past Surgical History: Reviewed history from 05/13/2007 and no changes required. Panendoscopy  Family History: Reviewed history from 05/13/2007 and no changes required. Family History of Melanoma Family History of Respiratory disease  Social History: Reviewed history from 07/15/2008 and no changes required. Never Smoked Alcohol use-no Divorced Occupation:carpenter recently remarried 3 weeks ago  Review of Systems      See HPI  Physical Exam  General:  Well-developed,well-nourished,in no acute distress;  alert,appropriate and cooperative throughout examination Msk:  the right great toe is somewhat swollen and tender.  No erythema.  The left shoulder shows full range of motion.  Some clicking.  The low back appears normal.  No spinal tenderness.  No muscular tenderness.  Neurologic exam normal Pulses:  R and L carotid,radial,femoral,dorsalis pedis and posterior tibial pulses are full and equal bilaterally Extremities:  No clubbing, cyanosis, edema, or deformity noted with normal full range of motion of all joints.   Neurologic:  No cranial nerve deficits noted. Station and gait are normal. Plantar reflexes are down-going bilaterally. DTRs are symmetrical throughout. Sensory, motor and coordinative functions appear intact.   Impression & Recommendations:  Problem # 1:  LUMBAR STRAIN (ICD-847.2) Assessment Deteriorated  Problem # 2:  SHOULDER PAIN, LEFT (ICD-719.41) Assessment: Deteriorated  His updated medication list for this problem includes:    Flexeril 10 Mg Tabs (Cyclobenzaprine hcl) .Marland Kitchen... 1 tab @ bedtime    Vicodin Es 7.5-750 Mg Tabs (Hydrocodone-acetaminophen) .Marland Kitchen... 1/2 at bedtime as needed  Problem # 3:  PAIN IN SOFT TISSUES OF LIMB (ICD-729.5) Assessment: New  Orders: Venipuncture (95621) TLB-BMP (Basic Metabolic Panel-BMET) (80048-METABOL) TLB-CBC Platelet - w/Differential (85025-CBCD) TLB-Uric Acid, Blood (84550-URIC) Specimen Handling (30865) Assessment: Deteriorated  Complete Medication List: 1)  Flonase 50 Mcg/act Susp (Fluticasone propionate) .... Uad 2)  Triamcinolone Acetonide 0.025 % Crea (Triamcinolone acetonide) .... Use twice daily 3)  Claritin-d 12 Hour 5-120 Mg Xr12h-tab (Loratadine-pseudoephedrine) .... Once daily  4)  Prilosec Otc 20 Mg Tbec (Omeprazole magnesium) .... Once daily 5)  Proventil Hfa 108 (90 Base) Mcg/act Aers (Albuterol sulfate) .... 2 ps qid 6)  Cozaar 50 Mg Tabs (Losartan potassium) .... Take 1 tablet by mouth every morning 7)  Zocor 10  Mg Tabs (Simvastatin) .Marland Kitchen.. 1 tab @ bedtime 8)  Flexeril 10 Mg Tabs (Cyclobenzaprine hcl) .Marland Kitchen.. 1 tab @ bedtime 9)  Ativan 0.5 Mg Tabs (Lorazepam) .Marland Kitchen.. 1 tab @ bedtime as needed 10)  Paroxetine Hcl 20 Mg Tabs (Paroxetine hcl) .... Take half tab once daily 11)  Vicodin Es 7.5-750 Mg Tabs (Hydrocodone-acetaminophen) .... 1/2 at bedtime as needed  Other Orders: TLB-Testosterone, Total (84403-TESTO)  Patient Instructions: 1)  begin Motrin 600 mg twice daily with food. 2)  Flexeril and Vicodin one half tab of each at bedtime for her shoulder and back pain. 3)  Call you next week with the report of your blood work Prescriptions: FLEXERIL 10 MG TABS (CYCLOBENZAPRINE HCL) 1 tab @ bedtime  #30- x 1   Entered and Authorized by:   Roderick Pee MD   Signed by:   Roderick Pee MD on 09/15/2010   Method used:   Print then Give to Patient   RxID:   [id number] VICODIN ES 7.5-750 MG TABS (HYDROCODONE-ACETAMINOPHEN) 1/2 at bedtime as needed  #30 x 1   Entered and Authorized by:   Roderick Pee MD   Signed by:   Roderick Pee MD on 09/15/2010   Method used:   Print then Give to Patient   RxID:   [id number]    Orders Added: 1)  Venipuncture [84696] 2)  TLB-BMP (Basic Metabolic Panel-BMET) [80048-METABOL] 3)  TLB-CBC Platelet - w/Differential [85025-CBCD] 4)  TLB-Uric Acid, Blood [84550-URIC] 5)  Est. Patient Level IV [29528] 6)  TLB-Testosterone, Total [84403-TESTO] 7)  Specimen Handling [99000]

## 2010-10-12 NOTE — Assessment & Plan Note (Signed)
Summary: follow up labs - rv   Vital Signs:  Patient profile:   45 year old male BP sitting:   120 / 84  (left arm) Cuff size:   regular  Vitals Entered By: Kern Reap CMA Duncan Dull) (September 19, 2010 4:40 PM) CC: follow-up labs   CC:  follow-up labs.  History of Present Illness: [name] is a 45 year old male, who comes in today for evaluation of fatigue, no energy, decreased erections.  We saw him for this problem.  Last week.  Examination was negative however, his testosterone level came back 237.  We discussed the various options, including watchful waiting, specialist referral, etc., etc. he wishes to try the medication, and follow-up labs here  Allergies: No Known Drug Allergies  Past History:  Past medical, surgical, family and social histories (including risk factors) reviewed for relevance to current acute and chronic problems.  Past Medical History: Reviewed history from 10/25/2008 and no changes required. Allergic rhinitis Anxiety GERD Pancreatitis, hx of etohism ED Hypertension Asthma  Past Surgical History: Reviewed history from 05/13/2007 and no changes required. Panendoscopy  Family History: Reviewed history from 05/13/2007 and no changes required. Family History of Melanoma Family History of Respiratory disease  Social History: Reviewed history from 07/15/2008 and no changes required. Never Smoked Alcohol use-no Divorced Occupation:carpenter recently remarried 3 weeks ago  Review of Systems      See HPI  Physical Exam  General:  Well-developed,well-nourished,in no acute distress; alert,appropriate and cooperative throughout examination   Impression & Recommendations:  Problem # 1:  HYPOGONADISM (ICD-257.2) Assessment New  Complete Medication List: 1)  Flonase 50 Mcg/act Susp (Fluticasone propionate) .... Uad 2)  Triamcinolone Acetonide 0.025 % Crea (Triamcinolone acetonide) .... Use twice daily 3)  Claritin-d 12 Hour 5-120 Mg Xr12h-tab  (Loratadine-pseudoephedrine) .... Once daily 4)  Prilosec Otc 20 Mg Tbec (Omeprazole magnesium) .... Once daily 5)  Proventil Hfa 108 (90 Base) Mcg/act Aers (Albuterol sulfate) .... 2 ps qid 6)  Cozaar 50 Mg Tabs (Losartan potassium) .... Take 1 tablet by mouth every morning 7)  Zocor 10 Mg Tabs (Simvastatin) .Marland Kitchen.. 1 tab @ bedtime 8)  Flexeril 10 Mg Tabs (Cyclobenzaprine hcl) .Marland Kitchen.. 1 tab @ bedtime 9)  Ativan 0.5 Mg Tabs (Lorazepam) .Marland Kitchen.. 1 tab @ bedtime as needed 10)  Paroxetine Hcl 20 Mg Tabs (Paroxetine hcl) .... Take half tab once daily 11)  Vicodin Es 7.5-750 Mg Tabs (Hydrocodone-acetaminophen) .... 1/2 at bedtime as needed 12)  Androderm 2.5 Mg/24hr Pt24 (Testosterone) .... One patch daily  Patient Instructions: 1)  use the supplement patch one daily return in 4 weeks for follow-up Prescriptions: ANDRODERM 2.5 MG/24HR PT24 (TESTOSTERONE) one patch daily  #30 x 11   Entered and Authorized by:   Roderick Pee MD   Signed by:   Roderick Pee MD on 09/19/2010   Method used:   Print then Give to Patient   RxID:   [id number]    Orders Added: 1)  Est. Patient Level III [78469]

## 2010-10-12 NOTE — Progress Notes (Signed)
Summary: med refill  Phone Note Refill Request Message from:  Patient  generic paxil last refill 11-[facility] [address]  Initial call taken by: Heron Sabins,  August 25, 2010 3:41 PM  Follow-up for Phone Call        Fleet Contras please call okayed refill medication x 1 year Follow-up by: Roderick Pee MD,  August 28, 2010 8:35 AM    New/Updated Medications: PAROXETINE HCL 20 MG TABS (PAROXETINE HCL) take half tab once daily Prescriptions: PAROXETINE HCL 20 MG TABS (PAROXETINE HCL) take half tab once daily  #45 x 3   Entered by:   Kern Reap CMA (AAMA)   Authorized by:   Roderick Pee MD   Signed by:   Kern Reap CMA (AAMA) on 08/28/2010   Method used:   Electronically to        [facility]  [phone]* (retail)       [address] [place], [place]  [address]       Ph: [phone] or [phone]       Fax: [phone]   RxID:   [id number]

## 2010-10-12 NOTE — Progress Notes (Signed)
Summary: refill request  Phone Note Call from Patient   Summary of Call: patient needs a refill of prozac Initial call taken by: Kern Reap CMA Duncan Dull),  October 04, 2010 11:55 AM    Prescriptions: PAROXETINE HCL 20 MG TABS (PAROXETINE HCL) take half tab once daily  #90 x 2   Entered by:   Kern Reap CMA (AAMA)   Authorized by:   Roderick Pee MD   Signed by:   Kern Reap CMA (AAMA) on 10/04/2010   Method used:   Electronically to        [facility]  [phone]* (retail)       [address] [place], [place]  [address]       Ph: [phone] or [phone]       Fax: [phone]   RxID:   [id number]

## 2010-10-19 ENCOUNTER — Encounter: Payer: Self-pay | Admitting: Family Medicine

## 2010-10-19 ENCOUNTER — Ambulatory Visit (INDEPENDENT_AMBULATORY_CARE_PROVIDER_SITE_OTHER): Payer: 59 | Admitting: Family Medicine

## 2010-10-19 DIAGNOSIS — F411 Generalized anxiety disorder: Secondary | ICD-10-CM

## 2010-10-19 DIAGNOSIS — E291 Testicular hypofunction: Secondary | ICD-10-CM

## 2010-10-19 MED ORDER — PAROXETINE HCL 20 MG PO TABS: ORAL_TABLET | ORAL | Status: DC

## 2010-10-19 MED ORDER — SILDENAFIL CITRATE 50 MG PO TABS: 50.0000 mg | ORAL_TABLET | ORAL | Status: DC | PRN

## 2010-10-19 MED ORDER — TESTOSTERONE 12.5 MG/ACT (1%) TD GEL: 4.0000 | Freq: Every day | TRANSDERMAL | Status: DC

## 2010-10-19 NOTE — Patient Instructions (Signed)
Let's switch to the testosterone pump,,,,,,,, 4 pumps daily.  Continue the Paxil, 10 mg nightly  Viagra 50 mg........Marland Kitchen Directions one half tab, one hour prior to sex with water.  Return in 6 weeks for follow-up

## 2010-10-19 NOTE — Progress Notes (Signed)
  Subjective:    Patient ID: Mosetta Pigeon, male    DOB: 16-Oct-1965, 45 y.o.   MRN: [id number] glennIs a 45 year old male, who comes in today for evaluation of two problems.  He is a history of mild anxiety and depression, for which she takes Paxil 10 mg daily.  It seems to be working well.  He wishes to continue this medication.  His workup showed hypogonadism.  We started him on testosterone 2.5-mg patches 4 weeks ago, and he feels like he is 50% better.  His energy level is better.  His mood is better and his libido is beginning to improve.  He still has sexual dysfunction. HPI    Review of Systems    Negative Objective:   Physical Exam Well-developed well-nourished, male in no acute distress       Assessment & Plan:  Hypogonadism  Anxiety.  Plan is to increase the testosterone to 5 mg daily and continue the Paxil 10 mg daily and add Viagra 50 mg one half tab p.r.n.

## 2010-11-01 ENCOUNTER — Other Ambulatory Visit: Payer: Self-pay | Admitting: Family Medicine

## 2010-11-01 DIAGNOSIS — I10 Essential (primary) hypertension: Secondary | ICD-10-CM

## 2011-01-02 ENCOUNTER — Encounter: Payer: Self-pay | Admitting: Family Medicine

## 2011-01-02 ENCOUNTER — Ambulatory Visit (INDEPENDENT_AMBULATORY_CARE_PROVIDER_SITE_OTHER): Payer: 59 | Admitting: Family Medicine

## 2011-01-02 VITALS — BP 120/80 | Temp 98.2°F | Ht 75.0 in | Wt 223.0 lb

## 2011-01-02 DIAGNOSIS — M549 Dorsalgia, unspecified: Secondary | ICD-10-CM

## 2011-01-02 MED ORDER — HYDROCODONE-ACETAMINOPHEN 7.5-750 MG PO TABS: ORAL_TABLET | ORAL | Status: DC

## 2011-01-02 MED ORDER — CYCLOBENZAPRINE HCL 10 MG PO TABS: ORAL_TABLET | ORAL | Status: DC

## 2011-01-02 NOTE — Progress Notes (Signed)
  Subjective:    Patient ID: [name], male    DOB: 07-13-1966, 45 y.o.   MRN: [id number]  HPI [name] is a 45 year old male, who comes in today for evaluation of low back pain.  He states he bent over yesterday and developed the sudden onset of severe right lumbar back pain.  He's had discomfort like this in the past.  It occurs once or twice a year.  It so is done better with medication and bedrest.  Now his pain is constant right lumbar 3 to a 7 on a scale of one to 10.  It radiates to behind his right knee.  He denies any numbness or decrease or change in muscle strength.  No bowel or bladder dysfunction.     Review of Systems    General musculoskeletal and neurologic review of systems otherwise, normal Objective:   Physical Exam Well-developed well-nourished man in no acute distress.  Examination was normal.  Lower extremities.  Legs are the same length.  Sensation muscle strength and reflexes all normal.  Positive straight leg raising right leg at 45 degrees.       Assessment & Plan:  Lumbar disk disease,,,,,,,,,,, Motrin 600 b.i.d., Flexeril, and Vicodin at bedtime, stretching exercises.  PT consult p.r.n.

## 2011-01-02 NOTE — Patient Instructions (Signed)
Motrin 600 mg twice daily with food.  Flexeril and Vicodin one half of each 3 times a day as needed for severe pain.  If you're working and then take the half of Flexeril and Vicodin only at bedtime.  Restart the stretching exercises twice daily.  Once y r  pain-free due the stretching exercises daily in the morning.  If we don't see any improvement over the next week or 10 days, then call me.  We will get u  set up to begin physical therapy

## 2011-01-26 NOTE — Discharge Summary (Signed)
[name], [name]                ACCOUNT NO.:  [id number]   MEDICAL RECORD NO.:  [id number]          PATIENT TYPE:  INP   LOCATION:  1618                         FACILITY:  [facility]   PHYSICIAN:  Raenette Rover. Felicity Coyer, MDDATE OF BIRTH:  1965/11/15   DATE OF ADMISSION:  06/08/2006  DATE OF DISCHARGE:  06/14/2006                                 DISCHARGE SUMMARY   DISCHARGE DIAGNOSES:  1. Acute pancreatitis, most likely alcoholic in nature, symptomatically      improved, tolerating oral intake without pain.  2. Ileus secondary to above, resolved. No electrolytes, positive bowel      movement.  3. Heavy alcohol abuse including moonshine according to wife. Offered      outpatient alcohol followup rehabilitation but declined at this time.      The patient reports he is committed to abstinence.  4. History of anxiety and depression with exacerbation due to social      situation (ongoing separation from wife).  5. 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      Admission hemoglobin 20, discharge hemoglobin 13.4.   DISCHARGE MEDICATIONS:  1. Paxil 20 mg p.o. daily.  2. Protonix 40 mg p.o. daily.  3. Vicodin 5/500 1-2 tablets p.o., q.4-6 hours, p.r.n. pain.   HOSPITAL FOLLOWUP:  Scheduled with primary care physician Dr. Alonza Smoker  first thing on Monday, June 17, 2006, at 8:15 in the morning to review  management of anxiety and depression as well as continued abstinence of  alcohol and any other problems that may arise.   CONSULTATIONS:  Include GI, Dr. [name]. Followup will be arranged with  her regarding questionable need for EGD in the future.   PROCEDURES:  1. Include CT abdomen and pelvis done June 09, 2006, showing severe      acute pancreatitis with fluid, inflammation and edema throughout      retroperitoneum, mesentery and transverse mesocolon.  2. Ultrasound of the abdomen done June 12, 2006, showing      hepatosplenomegaly. No gallstones,  common bile duct dilation or      evidence of acute cholecystitis.   DISPOSITION:  The patient is discharged home in medically stable and  improved condition.   HOSPITAL COURSE:  Acute alcoholic pancreatitis. The patient is a 45 year old  gentleman who presented to the emergency room June 08, 2006,  complaining of abdominal pain since earlier in the evening. CT in the  emergency room showed changes consistent with severe acute pancreatitis as  well as elevated lipase. At 8:13 he was admitted for bowel rest, IV fluids  and pain control. Fasting lipids were checked to rule out dyslipidemia as  cause for pancreatitis and these were overall normal. Most likely cause  given history was due to alcohol use. The patient's pain symptoms were  reasonably well-controlled, the primary complaint being due to dysmotility,  distention and low-grade nausea however diet was advanced without  difficulty. Because of the severe nature of changes on CT a GI consult was  obtained June 12, 2006, for reevaluation, who agreed with the working  diagnosis and  also recommended continued abstinence from alcohol. This has  been discussed with patient, who agrees. Ongoing evaluation by primary care  physician Dr. Alonza Smoker for underlying anxiety and depression will be held  Monday, June 17, 2006, at 8:15 a.m. as previously arranged. He is  tolerating p.o., having no significant pain, nausea or symptoms, and is felt  medically stable for discharge. Continue home medications of Paxil and  Protonix as prior to admission with Vicodin as needed for pain.   HOME RX/OTHER MEDICAL ISSUES:  Other medical issues are as listed above. No  changes have been in his regimen except as listed.      [name], MD  Electronically Signed     VAL/MEDQ  D:  06/14/2006  T:  06/15/2006  Job:  [id number]   cc:   Tinnie Gens A. Tawanna Cooler, MD  [address] [place]  [place] [address]   Hedwig Morton. Juanda Chance, MD  [address]  [place]  [place] [address]

## 2011-01-26 NOTE — Assessment & Plan Note (Signed)
 HEALTHCARE                           GASTROENTEROLOGY OFFICE NOTE   HERACLIO, SEIDMAN                       MRN:          [id number]  DATE:07/22/2006                            DOB:          [birth year]    Mr. Hollister is a 45 year old white male here for followup of acute  pancreatitis.  He was hospitalized at [facility] September 29 to  June 12, 2006  with acute abdominal pain, elevated amylase, after drinking  6-8 beers a day.  He has been abusing alcohol, had a questionable dependence  on it but since his hospitalization has not had any alcohol after being  counseled in Dr. Nelida Meuse office one time. He has been about 95% improved. The  only symptom that remain is chronic esophageal reflux which he has had for 5-  10 years.  He denies any dysphagia, odynophagia.  Despite of taking Protonix  40 mg a daily, he still has heartburn almost daily.   MEDICATIONS:  1. Allegra 80 mg p.o. daily.  2. Protonix 40 mg p.o. daily.  3. Paxil 20 mg p.o. daily.  4. Multivitamins.   PHYSICAL EXAMINATION:  Blood pressure 126/84.  Pulse 76.  Weight 198 pounds.  He was alert, oriented, in no distress.  LUNGS:  Clear to auscultation and voice was normal.  Oral cavity was normal.  NECK:  Supple.  COR:  Normal S2.  ABDOMEN:  Soft, nontender with normoactive bowel sounds.  RECTAL:  Not done.   LAB DATA:  CT scan of the abdomen done on June 09, 2006, showed severe  acute pancreatitis with fluid inflammation, edema throughout the  retroperitoneum and the transverse mesocolon with some adjacent thickening  of the distal stomach.   IMPRESSION:  1. A 45 year old white male status post acute pancreatitis due to alcohol,      now about 95% improved.  2. History of alcohol abuse.  The patient has stopped drinking since his      last hospitalization.  3. Gastroesophageal reflux disease, rule out Barrett's esophagus, hiatal      hernia, etc.   PLAN:  1. Add Zantac 150 mg p.o. nightly.  The patient actually has started doing      it at Dr. Nelida Meuse advice and feels that it is helping.  2. Upper endoscopy scheduled with biopsies.  3. Stay on low-fat diet.  No alcohol for 6 months.     Hedwig Morton. Juanda Chance, MD  Electronically Signed    DMB/MedQ  DD: 07/22/2006  DT: 07/22/2006  Job #: [id number]   cc:   Tinnie Gens A. Tawanna Cooler, MD

## 2011-01-26 NOTE — H&P (Signed)
[name], [name]                ACCOUNT NO.:  [id number]   MEDICAL RECORD NO.:  [id number]          PATIENT TYPE:  INP   LOCATION:  1618                         FACILITY:  [facility]   PHYSICIAN:  Corwin Levins, MD      DATE OF BIRTH:  1966/04/13   DATE OF ADMISSION:  06/08/2006  DATE OF DISCHARGE:                                HISTORY & PHYSICAL   CHIEF COMPLAINT:  Abdominal pain since earlier this evening.   HISTORY OF PRESENT ILLNESS:  [name] is a 45 year old white male here  with the complaints above, primarily located right upper quadrant and left  upper quadrant, crampy, no radiation except question some to the back.  There is some nausea but no vomiting, no fever or other GI symptoms at this  time.  He was found in the emergency room to have elevated lipase and CT  consistent with severe acute pancreatitis.  He is now for admission for pain  control and supportive care.   PAST MEDICAL HISTORY:  Illnesses:  1. Anxiety.  2. GERD.   Surgeries:  None.   ALLERGIES:  NONE.   MEDICATIONS:  1. Allegra D b.i.d. p.r.n.  2. Protonix 40 mg one p.o. daily.  3. Paxil 20 mg one p.o. daily.   SOCIAL HISTORY:  Alcohol 6-8 beers per day.  Tobacco none.  Separated.  No  children.  Does carpentry work.   FAMILY HISTORY:  Negative for alcoholism and depression.   REVIEW OF SYSTEMS:  Otherwise noncontributory.   PHYSICAL EXAM:  [name] is a 45 year old white male afebrile, blood  pressure 158/106, heart rate 122, respirations 20, O2 saturation 96%.  Pain  on admission 7/10, now 1/10 at best after Dilaudid.  ENT EXAM:  Sclera clear.  TMs clear.  Pharynx benign.  NECK:  Without lymphadenopathy, JVD, hepatomegaly.  CHEST:  No rales or wheezes.  CARDIAC EXAM:  A regular rate and rhythm.  ABDOMEN:  Soft, nontender __________, decreased bowel sounds, otherwise no  organomegaly.  EXTREMITIES:  No edema.   LABS:  UA negative, lipase 813, white blood cell count 13.6, hemoglobin  20.2.  Electrolytes within normal limits.  BUN 12, creatinine 1, glucose  172.  LFTs within normal limits.  CT of the abdomen and pelvis consistent  with severe acute pancreatitis, no fluid collection.   ASSESSMENT AND PLAN:  1. Acute pancreatitis, severe pain with elevated white blood cells      lavishly and elevated lipase, but otherwise stable.  This is most      likely secondary to alcohol.  Will check lipids, but again most likely      secondary to alcohol.  He is being admitted for pain control, IV      fluids, made nothing by mouth and treated with supportive care.  2. Alcohol abuse with questionable dependence.  Will need to watch for      delirium tremens.  If there is no history of this, start multivitamin,      thiamine, folate, use Librium as needed.  3. Anxiety disorder.  Continue Paxil.  4. Polycythemia,  mild.  Will recheck after IV fluids.  5. Prophylaxis.  He will start with Lovenox 30 mg subcutaneously twice      daily, increase activity as able.           ______________________________  Corwin Levins, MD     JWJ/MEDQ  D:  06/09/2006  T:  06/09/2006  Job:  [id number]   cc:   Tawanna Cooler, Dr.  [facility]

## 2011-02-19 ENCOUNTER — Other Ambulatory Visit: Payer: Self-pay | Admitting: Family Medicine

## 2011-02-22 ENCOUNTER — Other Ambulatory Visit: Payer: Self-pay | Admitting: Family Medicine

## 2011-03-18 ENCOUNTER — Emergency Department (HOSPITAL_COMMUNITY)
Admission: EM | Admit: 2011-03-18 | Discharge: 2011-03-18 | Disposition: A | Discharge status: Home / Self Care | Payer: 59 | Attending: Emergency Medicine | Admitting: Emergency Medicine

## 2011-03-18 DIAGNOSIS — I10 Essential (primary) hypertension: Secondary | ICD-10-CM | POA: Insufficient documentation

## 2011-03-18 DIAGNOSIS — T148XXA Other injury of unspecified body region, initial encounter: Secondary | ICD-10-CM | POA: Insufficient documentation

## 2011-03-18 DIAGNOSIS — X500XXA Overexertion from strenuous movement or load, initial encounter: Secondary | ICD-10-CM | POA: Insufficient documentation

## 2011-03-18 DIAGNOSIS — M549 Dorsalgia, unspecified: Secondary | ICD-10-CM | POA: Insufficient documentation

## 2011-03-18 DIAGNOSIS — E7800 Pure hypercholesterolemia, unspecified: Secondary | ICD-10-CM | POA: Insufficient documentation

## 2011-03-18 DIAGNOSIS — F411 Generalized anxiety disorder: Secondary | ICD-10-CM | POA: Insufficient documentation

## 2011-05-09 ENCOUNTER — Other Ambulatory Visit: Payer: Self-pay | Admitting: Family Medicine

## 2011-05-10 ENCOUNTER — Other Ambulatory Visit: Payer: Self-pay | Admitting: *Deleted

## 2011-05-10 DIAGNOSIS — E291 Testicular hypofunction: Secondary | ICD-10-CM

## 2011-05-10 MED ORDER — TESTOSTERONE 12.5 MG/ACT (1%) TD GEL: 4.0000 | Freq: Every day | TRANSDERMAL | Status: DC

## 2011-06-21 ENCOUNTER — Ambulatory Visit: Payer: 59

## 2011-06-21 DIAGNOSIS — Z0000 Encounter for general adult medical examination without abnormal findings: Secondary | ICD-10-CM

## 2011-06-21 DIAGNOSIS — Z0389 Encounter for observation for other suspected diseases and conditions ruled out: Secondary | ICD-10-CM

## 2011-06-21 LAB — CBC WITH DIFFERENTIAL/PLATELET
Basophils Absolute: 0 10*3/uL (ref 0.0–0.1)
HCT: 46 % (ref 39.0–52.0)
Hemoglobin: 16 g/dL (ref 13.0–17.0)
Lymphs Abs: 1.4 10*3/uL (ref 0.7–4.0)
MCHC: 34.8 g/dL (ref 30.0–36.0)
MCV: 87.1 fl (ref 78.0–100.0)
Monocytes Relative: 6.9 % (ref 3.0–12.0)
Neutro Abs: 3 10*3/uL (ref 1.4–7.7)
RDW: 12.4 % (ref 11.5–14.6)

## 2011-06-21 LAB — URINALYSIS
Bilirubin Urine: NEGATIVE
Ketones, ur: NEGATIVE
Leukocytes, UA: NEGATIVE
pH: 5.5 (ref 5.0–8.0)

## 2011-06-21 LAB — BASIC METABOLIC PANEL
BUN: 20 mg/dL (ref 6–23)
CO2: 26 mEq/L (ref 19–32)
Chloride: 104 mEq/L (ref 96–112)
Creatinine, Ser: 0.7 mg/dL (ref 0.4–1.5)

## 2011-06-21 LAB — HEPATIC FUNCTION PANEL
AST: 31 U/L (ref 0–37)
Alkaline Phosphatase: 59 U/L (ref 39–117)
Bilirubin, Direct: 0.1 mg/dL (ref 0.0–0.3)
Total Bilirubin: 0.9 mg/dL (ref 0.3–1.2)

## 2011-06-21 LAB — LIPID PANEL
Cholesterol: 198 mg/dL (ref 0–200)
Total CHOL/HDL Ratio: 5

## 2011-06-28 ENCOUNTER — Ambulatory Visit (INDEPENDENT_AMBULATORY_CARE_PROVIDER_SITE_OTHER): Payer: 59 | Admitting: Family Medicine

## 2011-06-28 ENCOUNTER — Encounter: Payer: Self-pay | Admitting: Family Medicine

## 2011-06-28 DIAGNOSIS — Z23 Encounter for immunization: Secondary | ICD-10-CM

## 2011-06-28 DIAGNOSIS — E291 Testicular hypofunction: Secondary | ICD-10-CM

## 2011-06-28 DIAGNOSIS — E785 Hyperlipidemia, unspecified: Secondary | ICD-10-CM

## 2011-06-28 DIAGNOSIS — J309 Allergic rhinitis, unspecified: Secondary | ICD-10-CM

## 2011-06-28 DIAGNOSIS — F528 Other sexual dysfunction not due to a substance or known physiological condition: Secondary | ICD-10-CM

## 2011-06-28 DIAGNOSIS — IMO0002 Reserved for concepts with insufficient information to code with codable children: Secondary | ICD-10-CM

## 2011-06-28 DIAGNOSIS — J45909 Unspecified asthma, uncomplicated: Secondary | ICD-10-CM

## 2011-06-28 DIAGNOSIS — F411 Generalized anxiety disorder: Secondary | ICD-10-CM

## 2011-06-28 MED ORDER — TESTOSTERONE 10 MG/ACT (2%) TD GEL: 4.0000 | TRANSDERMAL | Status: DC

## 2011-06-28 MED ORDER — ALBUTEROL SULFATE HFA 108 (90 BASE) MCG/ACT IN AERS: 2.0000 | INHALATION_SPRAY | Freq: Four times a day (QID) | RESPIRATORY_TRACT | Status: DC | PRN

## 2011-06-28 MED ORDER — LOSARTAN POTASSIUM 50 MG PO TABS: 50.0000 mg | ORAL_TABLET | Freq: Every day | ORAL | Status: DC

## 2011-06-28 MED ORDER — FLUTICASONE PROPIONATE 50 MCG/ACT NA SUSP: 1.0000 | Freq: Every day | NASAL | Status: DC

## 2011-06-28 MED ORDER — OMEPRAZOLE 20 MG PO CPDR: 20.0000 mg | DELAYED_RELEASE_CAPSULE | Freq: Every day | ORAL | Status: DC

## 2011-06-28 MED ORDER — PAROXETINE HCL 20 MG PO TABS: ORAL_TABLET | ORAL | Status: DC

## 2011-06-28 MED ORDER — TRIAMCINOLONE ACETONIDE 0.025 % EX CREA: TOPICAL_CREAM | Freq: Two times a day (BID) | CUTANEOUS | Status: DC

## 2011-06-28 MED ORDER — SIMVASTATIN 10 MG PO TABS: 10.0000 mg | ORAL_TABLET | Freq: Every day | ORAL | Status: DC

## 2011-06-28 NOTE — Progress Notes (Signed)
  Subjective:    Patient ID: [name], male    DOB: 1965/11/15, 45 y.o.   MRN: [id number]  HPI [name] is a 45 year old Single male, nonsmoker, who comes in today for general physical examination because of a history of hypertension, reflux, esophagitis, mild depression, hyperlipidemia, hypogonadism, allergic rhinitis and asthma.  His problems are all clinically stable.  He no longer needs the Viagra since he started the testosterone supplement.   Review of Systems  Constitutional: Negative.   HENT: Negative.   Eyes: Negative.   Respiratory: Negative.   Cardiovascular: Negative.   Gastrointestinal: Negative.   Genitourinary: Negative.   Musculoskeletal: Negative.   Skin: Negative.   Neurological: Negative.   Hematological: Negative.   Psychiatric/Behavioral: Negative.        Objective:   Physical Exam  Constitutional: He is oriented to person, place, and time. He appears well-developed and well-nourished.  HENT:  Head: Normocephalic and atraumatic.  Right Ear: External ear normal.  Left Ear: External ear normal.  Nose: Nose normal.  Mouth/Throat: Oropharynx is clear and moist.  Eyes: Conjunctivae and EOM are normal. Pupils are equal, round, and reactive to light.  Neck: Normal range of motion. Neck supple. No JVD present. No tracheal deviation present. No thyromegaly present.  Cardiovascular: Normal rate, regular rhythm, normal heart sounds and intact distal pulses.  Exam reveals no gallop and no friction rub.   No murmur heard. Pulmonary/Chest: Effort normal and breath sounds normal. No stridor. No respiratory distress. He has no wheezes. He has no rales. He exhibits no tenderness.  Abdominal: Soft. Bowel sounds are normal. He exhibits no distension and no mass. There is no tenderness. There is no rebound and no guarding.  Genitourinary: Rectum normal, prostate normal and penis normal. Guaiac negative stool. No penile tenderness.  Musculoskeletal: Normal range of motion.  He exhibits no edema and no tenderness.  Lymphadenopathy:    He has no cervical adenopathy.  Neurological: He is alert and oriented to person, place, and time. He has normal reflexes. No cranial nerve deficit. He exhibits normal muscle tone.  Skin: Skin is warm and dry. No rash noted. No erythema. No pallor.  Psychiatric: He has a normal mood and affect. His behavior is normal. Judgment and thought content normal.          Assessment & Plan:  Healthy male.  History of allergic rhinitis.  Continue Claritin, and steroid nasal spray.  History of asthma.  Albuterol p.r.n.  Hypertension.  Continue Cozaar 50 mg daily.  Reflux esophagitis.  Continue Prilosec 20 daily.  History of mild depression.  Continue Paxil 20 daily.  History of hyperlipidemia.  Continue Zocor 10 mg daily.  History of hypo-gonad is some continued testosterone supplement.

## 2011-06-28 NOTE — Patient Instructions (Signed)
[name].Marland KitchenMarland KitchenMarland KitchenMarland Kitchen4 pumps  Daily.  Return in one year, sooner if any problems

## 2011-08-07 ENCOUNTER — Telehealth: Payer: Self-pay | Admitting: Family Medicine

## 2011-08-07 NOTE — Telephone Encounter (Signed)
Pt called and said that [facility] on [address] is needing Verification or auth to fill script for Testosterone (FORTESTA) 10 MG/ACT (2%) GEL.

## 2011-08-07 NOTE — Telephone Encounter (Signed)
Spoke with patient.

## 2011-10-11 ENCOUNTER — Other Ambulatory Visit: Payer: Self-pay | Admitting: *Deleted

## 2011-10-11 MED ORDER — FLUTICASONE PROPIONATE 50 MCG/ACT NA SUSP: 1.0000 | Freq: Every day | NASAL | Status: DC

## 2011-11-22 ENCOUNTER — Telehealth: Payer: Self-pay | Admitting: Family Medicine

## 2011-11-22 MED ORDER — HYDROCODONE-HOMATROPINE 5-1.5 MG/5ML PO SYRP: 5.0000 mL | ORAL_SOLUTION | Freq: Three times a day (TID) | ORAL | Status: AC | PRN

## 2011-11-22 NOTE — Telephone Encounter (Signed)
Pt experiencing a lot of head/ sinus congestion/ a lot of drainage and would like to have an rx called in for him  [employer]

## 2011-11-22 NOTE — Telephone Encounter (Signed)
For allergic symptoms we recommend plain Claritin 10 mg in the morning or plain Zyrtec 10 mg at bedtime  He can also have a 4 ounce bottle of hydrocodone cough syrup directions 1/2-1 teaspoon at bedtime when necessary for cough refills x1 if the cough is a prominent issue

## 2011-11-23 ENCOUNTER — Other Ambulatory Visit: Payer: Self-pay | Admitting: Family Medicine

## 2011-11-30 ENCOUNTER — Other Ambulatory Visit: Payer: Self-pay | Admitting: Family Medicine

## 2012-01-01 ENCOUNTER — Other Ambulatory Visit: Payer: Self-pay | Admitting: *Deleted

## 2012-01-01 DIAGNOSIS — E291 Testicular hypofunction: Secondary | ICD-10-CM

## 2012-01-01 MED ORDER — TESTOSTERONE 10 MG/ACT (2%) TD GEL: 4.0000 | TRANSDERMAL | Status: DC

## 2012-01-20 ENCOUNTER — Other Ambulatory Visit: Payer: Self-pay | Admitting: Family Medicine

## 2012-02-20 ENCOUNTER — Other Ambulatory Visit: Payer: Self-pay | Admitting: *Deleted

## 2012-02-20 DIAGNOSIS — E291 Testicular hypofunction: Secondary | ICD-10-CM

## 2012-02-20 MED ORDER — TESTOSTERONE 10 MG/ACT (2%) TD GEL: 4.0000 | TRANSDERMAL | Status: DC

## 2012-04-22 ENCOUNTER — Encounter: Payer: Self-pay | Admitting: Family Medicine

## 2012-04-22 ENCOUNTER — Ambulatory Visit (INDEPENDENT_AMBULATORY_CARE_PROVIDER_SITE_OTHER): Payer: 59 | Admitting: Family Medicine

## 2012-04-22 VITALS — BP 110/80 | Temp 98.3°F | Wt 220.0 lb

## 2012-04-22 DIAGNOSIS — M6580 Other synovitis and tenosynovitis, unspecified site: Secondary | ICD-10-CM

## 2012-04-22 DIAGNOSIS — M778 Other enthesopathies, not elsewhere classified: Secondary | ICD-10-CM

## 2012-04-22 DIAGNOSIS — K2100 Gastro-esophageal reflux disease with esophagitis, without bleeding: Secondary | ICD-10-CM

## 2012-04-22 DIAGNOSIS — M722 Plantar fascial fibromatosis: Secondary | ICD-10-CM

## 2012-04-22 DIAGNOSIS — F411 Generalized anxiety disorder: Secondary | ICD-10-CM

## 2012-04-22 DIAGNOSIS — F17200 Nicotine dependence, unspecified, uncomplicated: Secondary | ICD-10-CM

## 2012-04-22 MED ORDER — TRAMADOL HCL 50 MG PO TABS: ORAL_TABLET | ORAL | Status: DC

## 2012-04-22 MED ORDER — BUPROPION HCL ER (SR) 100 MG PO TB12: 100.0000 mg | ORAL_TABLET | Freq: Two times a day (BID) | ORAL | Status: DC

## 2012-04-22 MED ORDER — VARENICLINE TARTRATE 1 MG PO TABS: ORAL_TABLET | ORAL | Status: DC

## 2012-04-22 NOTE — Progress Notes (Signed)
  Subjective:    Patient ID: [name], male    DOB: 05-Jun-1966, 46 y.o.   MRN: [id number]  HPI [name] is a 46 year old married male nonsmoker who comes in today for evaluation of 3 problems  Actually 4  For the past couple weeks has had more trouble with reflux esophagitis. He says when he lays back he gets a sensation of acid up in his esophagus. He is taking Prilosec 20 mg daily but it doesn't seem to be helping. He describes a discomfort in his chest as a dull pain that comes and goes a 3 on a scale of 1-10 no cardiac or pulmonary symptoms.  He works and manual labor and has had pain in his right elbow now for the past 3 months no history of trauma  He's also pain in his left heel and he points to the plantar fascia as a source of his pain  He also has a history of anxiety and has been on Paxil 20 mg for many years however does have a negative effect on his sex life and would like to discuss options  He's also using smokeless tobacco   Review of Systems Gen. cardiovascular GI musculoskeletal review of systems otherwise negative    Objective:   Physical Exam Well-developed well-nourished male in no acute distress a cardiopulmonary exam normal bowel sounds normal tenderness right elbow consistent with tendinitis also tenderness left plantar fascia consistent with plantar fasciitis       Assessment & Plan:  Reflux esophagitis double Prilosec change diet followup in 6 weeks  History of anxiety DC Paxil start Wellbutrin followup 6 weeks  Tendinitis right elbow Motrin 600 mg twice daily with food  Plantar fasciitis Motrin as above exercise program PT consult if symptoms persist  Using the smokeless tobacco plan DC Chantix one half milligram daily

## 2012-04-22 NOTE — Patient Instructions (Signed)
Stop the Paxil and begin Wellbutrin 1 daily in the morning  Motrin 400 mg twice daily for your foot and elbow pain  Do the exercises in the morning before you get out of bed  Elevation and ice on your elbow and foot 30 minutes prior to bedtime  For the reflux esophagitis double the Prilosec take one twice daily  Also dietary-wise be sure to avoid all caffeinated beverages and nothing to eat or drink for 2 hours prior to bedtime and sleep on 2 pillows  Return in one month for followup

## 2012-05-22 ENCOUNTER — Encounter: Payer: Self-pay | Admitting: Family Medicine

## 2012-05-22 ENCOUNTER — Ambulatory Visit (INDEPENDENT_AMBULATORY_CARE_PROVIDER_SITE_OTHER): Payer: BC Managed Care – PPO | Admitting: Family Medicine

## 2012-05-22 VITALS — BP 120/80 | Temp 98.6°F | Wt 221.0 lb

## 2012-05-22 DIAGNOSIS — K2100 Gastro-esophageal reflux disease with esophagitis, without bleeding: Secondary | ICD-10-CM

## 2012-05-22 DIAGNOSIS — Z23 Encounter for immunization: Secondary | ICD-10-CM

## 2012-05-22 DIAGNOSIS — M6580 Other synovitis and tenosynovitis, unspecified site: Secondary | ICD-10-CM

## 2012-05-22 DIAGNOSIS — F411 Generalized anxiety disorder: Secondary | ICD-10-CM

## 2012-05-22 DIAGNOSIS — E785 Hyperlipidemia, unspecified: Secondary | ICD-10-CM

## 2012-05-22 DIAGNOSIS — M778 Other enthesopathies, not elsewhere classified: Secondary | ICD-10-CM

## 2012-05-22 DIAGNOSIS — F17200 Nicotine dependence, unspecified, uncomplicated: Secondary | ICD-10-CM

## 2012-05-22 MED ORDER — BUPROPION HCL ER (SR) 100 MG PO TB12: ORAL_TABLET | ORAL | Status: DC

## 2012-05-22 MED ORDER — SIMVASTATIN 10 MG PO TABS: 10.0000 mg | ORAL_TABLET | Freq: Every day | ORAL | Status: DC

## 2012-05-22 NOTE — Progress Notes (Signed)
  Subjective:    Patient ID: [name], male    DOB: 17-Feb-1966, 46 y.o.   MRN: [id number]  HPI [name] is a 46 year old male who comes in today for followup of 3 issues  He takes Prilosec 20 mg twice daily however he still having symptoms of reflux and nausea. No difficulty swallowing he's been diligent about the antibiotic for measures therefore we'll set him up for a GI consult  He takes Wellbutrin 200 mg daily for mild depression  He stopped the Chantix and he stopped using smokeless tobacco. He also needs a refill on a simvastatin general and she general   Review of Systems    general and GI review of systems otherwise negative well-developed well-developed well-nourished male in no acute distress Objective:   Physical Exam  Well-developed well-nourished male in no acute distress      Assessment & Plan:  Reflux esophagitis with max medical therapy still symptomatic refer for GI consult  Tendinitis right elbow brace elevation and ice do not take any NSAIDs because of the reflux  Mild depression continue Wellbutrin

## 2012-05-22 NOTE — Patient Instructions (Signed)
Continue your reflux treatment program  Continue to avoid nicotine products  We will set you up a consult and GI for further evaluation  Get a brace for your right elbow  I refilled the Wellbutrin,,,,,,,,,,, take 2 tablets daily in the morning  I also refilled your simvastatin

## 2012-05-22 NOTE — Addendum Note (Signed)
Addended by: Kern Reap B on: 05/22/2012 05:39 PM   Modules accepted: Orders

## 2012-05-23 ENCOUNTER — Telehealth: Payer: Self-pay | Admitting: Family Medicine

## 2012-05-23 ENCOUNTER — Telehealth: Payer: Self-pay | Admitting: Internal Medicine

## 2012-05-23 NOTE — Telephone Encounter (Signed)
Patient called stating that his pharmacy is stating that it is too soon to fill his wellbutrin and he really needs this. Please assist.

## 2012-05-23 NOTE — Telephone Encounter (Signed)
Spoke with patient and scheduled on 05/27/12 at 8:30 AM with Mike Gip, PA

## 2012-05-23 NOTE — Telephone Encounter (Signed)
Patient will have to pay out of pocket due to insurance and patient is aware

## 2012-05-27 ENCOUNTER — Other Ambulatory Visit (INDEPENDENT_AMBULATORY_CARE_PROVIDER_SITE_OTHER): Payer: BC Managed Care – PPO

## 2012-05-27 ENCOUNTER — Encounter: Payer: Self-pay | Admitting: Internal Medicine

## 2012-05-27 ENCOUNTER — Encounter: Payer: Self-pay | Admitting: Physician Assistant

## 2012-05-27 ENCOUNTER — Ambulatory Visit (INDEPENDENT_AMBULATORY_CARE_PROVIDER_SITE_OTHER): Payer: BC Managed Care – PPO | Admitting: Physician Assistant

## 2012-05-27 VITALS — BP 120/88 | HR 64 | Ht 74.5 in | Wt 219.0 lb

## 2012-05-27 DIAGNOSIS — R110 Nausea: Secondary | ICD-10-CM

## 2012-05-27 DIAGNOSIS — R079 Chest pain, unspecified: Secondary | ICD-10-CM

## 2012-05-27 DIAGNOSIS — K219 Gastro-esophageal reflux disease without esophagitis: Secondary | ICD-10-CM

## 2012-05-27 LAB — CBC WITH DIFFERENTIAL/PLATELET
Eosinophils Relative: 3.3 % (ref 0.0–5.0)
HCT: 45.4 % (ref 39.0–52.0)
Hemoglobin: 15.4 g/dL (ref 13.0–17.0)
Lymphs Abs: 1.2 10*3/uL (ref 0.7–4.0)
Monocytes Relative: 6.4 % (ref 3.0–12.0)
Neutro Abs: 2.8 10*3/uL (ref 1.4–7.7)
Platelets: 142 10*3/uL — ABNORMAL LOW (ref 150.0–400.0)
WBC: 4.4 10*3/uL — ABNORMAL LOW (ref 4.5–10.5)

## 2012-05-27 LAB — COMPREHENSIVE METABOLIC PANEL
CO2: 27 mEq/L (ref 19–32)
Calcium: 8.6 mg/dL (ref 8.4–10.5)
GFR: 91.75 mL/min (ref >60.00)
Glucose, Bld: 101 mg/dL — ABNORMAL HIGH (ref 70–99)
Sodium: 138 mEq/L (ref 135–145)
Total Bilirubin: 1.2 mg/dL (ref 0.3–1.2)
Total Protein: 7.1 g/dL (ref 6.0–8.3)

## 2012-05-27 MED ORDER — DEXLANSOPRAZOLE 60 MG PO CPDR: 60.0000 mg | DELAYED_RELEASE_CAPSULE | Freq: Every day | ORAL | Status: DC

## 2012-05-27 NOTE — Patient Instructions (Addendum)
Please go to the basement level to have your labs drawn.  We have scheduled the Endoscopy with Dr. Juanda Chance.  Directions and brochure provided.  We scheduled the Ultrasound for Thurs 9-19. Arrive to [facility] Radiology 1st floor at 7:15 Am.  Have nothing by mouth after midnight.

## 2012-05-27 NOTE — Progress Notes (Signed)
I agree with assessment and plan.

## 2012-05-27 NOTE — Progress Notes (Signed)
Subjective:    Patient ID: [name], male    DOB: [birth year], 46 y.o.   MRN: [id number]  HPI Vicente is a pleasant 46 year old white male referred by Dr. Tawanna Cooler. He has history of anxiety hypertension hyperlipidemia and chronic reflux. Patient had been seen by Dr. 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 He says he stopped drinking and has not had any recurrence. He did undergo upper endoscopy in January of 2008 due to ongoing reflux symptoms was found to have 1 small polyp in his midesophagus normal-appearing GE junction and evidence of gastritis. Biopsies from the GE junction just showed no evidence of intestinal metaplasia in the esophageal polyp showed benign squamous mucosa with features of a fibroepithelial polyp. Patient states that he has been on Prilosec for years which initially worked well. Now over the past 3-4 weeks he has increased his dosage to twice daily but is still been experiencing symptoms of intermittent nausea which seems to be aggravated by by mouth intake and mid chest pressure has been intermittent as well. He describes this as a tightness type of feeling but says it is not seem to be related to activity or meals. This is not positional either. He is no associated shortness of breath weakness dizziness etc. He is not really having any heartburn symptoms currently his appetite has been fine and his weight has been stable. He denies any dysphagia or odynophagia. Prior to the past few weeks he had been taking NSAIDs recently in the form of Motrin and BC powders for tennis elbow type symptoms and has since stopped them. He is only been on the twice daily of Protonix for a couple of weeks but has not noticed any difference in his symptoms.    Review of Systems  Constitutional: Negative.   HENT: Negative.   Eyes: Negative.   Respiratory: Negative.   Cardiovascular: Positive for chest pain.    Gastrointestinal: Positive for nausea.  Genitourinary: Negative.   Musculoskeletal: Negative.   Skin: Negative.   Neurological: Negative.   Hematological: Negative.   Psychiatric/Behavioral: Negative.      Outpatient Prescriptions Prior to Visit  Medication Sig Dispense Refill  . albuterol (VENTOLIN HFA) 108 (90 BASE) MCG/ACT inhaler Inhale 2 puffs into the lungs every 6 (six) hours as needed for wheezing. Every 4 hours  18 Inhaler  2  . buPROPion (WELLBUTRIN SR) 100 MG 12 hr tablet 2 by mouth every morning  200 tablet  3  . fluticasone (FLONASE) 50 MCG/ACT nasal spray Place 1 spray into the nose daily.  16 g  6  . losartan (COZAAR) 50 MG tablet Take 1 tablet (50 mg total) by mouth daily.  100 tablet  3  . omeprazole (PRILOSEC) 20 MG capsule Take 20 mg by mouth 2 (two) times daily.      Marland Kitchen PARoxetine (PAXIL) 20 MG tablet One tablet daily at bedtime  100 tablet  3  . simvastatin (ZOCOR) 10 MG tablet Take 1 tablet (10 mg total) by mouth at bedtime.  100 tablet  3  . Testosterone (FORTESTA) 10 MG/ACT (2%) GEL Place 4 Act onto the skin 1 day or 1 dose.  60 g  4  . traMADol (ULTRAM) 50 MG tablet 1/2-1 tablet 3 times daily as needed for pain  50 tablet  2  . triamcinolone (KENALOG) 0.025 % cream Apply topically 2 (two) times daily.  30 g  5  .  varenicline (CHANTIX) 1 MG tablet One half tab every morning  30 tablet  5   No Known Allergies     Patient Active Problem List  Diagnosis  . HYPERLIPIDEMIA  . ANXIETY  . ERECTILE DYSFUNCTION  . ALLERGIC RHINITIS  . ASTHMA  . HYPERTENSION NEC  . HYPOGONADISM  . Reflux esophagitis  . Tendinitis of right elbow  . Plantar fasciitis, left  . Nicotine dependence   History   Social History  . Marital Status: Married    Spouse Name: N/A    Number of Children: N/A  . Years of Education: N/A   Occupational History  . Not on file.   Social History Main Topics  . Smoking status: Never Smoker   . Smokeless tobacco: Former Neurosurgeon    Types:  Chew  . Alcohol Use: No  . Drug Use: No  . Sexually Active: Not on file   Other Topics Concern  . Not on file   Social History Narrative  . No narrative on file    Objective:   Physical Exam well-developed middle-aged white male in no acute distress, pleasant blood pressure 120/88 pulse 64 height 6 foot 2 weight 219. HEENT; nontraumatic normocephalic EOMI PERRLA sclera anicteric,Neck; Supple no JVD, Cardiovascular; regular rate and rhythm with S1-S2 no murmur or gallop, Pulmonary; clear bilaterally, Abdomen; soft some minimal tenderness in the epigastrium there is no guarding no rebound no palpable mass or hepatosplenomegaly bowel sounds are present, Rectal; exam not done, Extremities; no clubbing cyanosis or edema skin warm and dry, Psych; mood and affect normal and appropriate        Assessment & Plan:  #20 46 year old male with chronic GERD presenting now with 3-4 week history of nausea and intermittent chest tightness which is nonexertional non-positional and on meal related. Rule out reflux esophagitis, rule out NSAID-induced gastropathy, rule out gallbladder disease. His current symptoms are not suggestive of cardiac etiology, however if GI workup is unrevealing and symptoms persist he will need referral for cardiac evaluation  Plan; stop Prilosec and give trial of Dexilant  60 mg by mouth every morning, he was given a couple weeks' worth ofsamples today Stop NSAIDs Schedule for upper Abdominal ultrasound Schedule for upper endoscopy with Dr. Juanda Chance,, procedure discussed in detail with the patient and he is agreeable to proceed We'll check CBC and CMET today

## 2012-05-29 ENCOUNTER — Ambulatory Visit (HOSPITAL_COMMUNITY)
Admission: RE | Admit: 2012-05-29 | Discharge: 2012-05-29 | Disposition: A | Discharge status: Home / Self Care | Payer: BC Managed Care – PPO | Source: Ambulatory Visit | Attending: Physician Assistant | Admitting: Physician Assistant

## 2012-05-29 ENCOUNTER — Telehealth: Payer: Self-pay | Admitting: *Deleted

## 2012-05-29 DIAGNOSIS — R0789 Other chest pain: Secondary | ICD-10-CM | POA: Insufficient documentation

## 2012-05-29 DIAGNOSIS — R110 Nausea: Secondary | ICD-10-CM | POA: Insufficient documentation

## 2012-05-29 DIAGNOSIS — K219 Gastro-esophageal reflux disease without esophagitis: Secondary | ICD-10-CM

## 2012-05-29 DIAGNOSIS — R161 Splenomegaly, not elsewhere classified: Secondary | ICD-10-CM | POA: Insufficient documentation

## 2012-05-29 DIAGNOSIS — R079 Chest pain, unspecified: Secondary | ICD-10-CM

## 2012-05-29 NOTE — Telephone Encounter (Signed)
I called the patient to advise I got a [facility] from [facility] asking [place] to do a prior authorizaion for the Dexilant 60 mg once daily.  I let him know I did some research to see over time what he has tried in the past regarding a PPI.  I saw back in 2008 and 2009 he had taken Protonix.  From 2010 to the office visit last week with Amy he had been on Prilosec 20 mg.  Amy changed him to Dexilant 60 mg once daily  With samples.  I didn't mean to send a prescription but I did.  I reviewed Amy's note from 05-27-2012.  He doesn't have any dysphagia, odynophagia, or heartburn.  He is having chest tightness, discomfort and intermittent nausea.  I told him that is not enough for the insurance company to approve this prescription. He thought it would not be approved according to what the pharmacist told him.  I put more samples at the front desk for him.  He thanked me for calling and I told him we could revisit this after the EGD, scheduled in 07-23-2012.  We will find out what Dr. Juanda Chance finds and go from there.

## 2012-06-25 ENCOUNTER — Other Ambulatory Visit: Payer: Self-pay | Admitting: Family Medicine

## 2012-07-06 ENCOUNTER — Other Ambulatory Visit: Payer: Self-pay | Admitting: Family Medicine

## 2012-07-23 ENCOUNTER — Encounter: Payer: Self-pay | Admitting: *Deleted

## 2012-07-23 ENCOUNTER — Ambulatory Visit (AMBULATORY_SURGERY_CENTER): Payer: BC Managed Care – PPO | Admitting: Internal Medicine

## 2012-07-23 ENCOUNTER — Encounter: Payer: Self-pay | Admitting: Internal Medicine

## 2012-07-23 VITALS — BP 120/78 | HR 78 | Temp 97.4°F | Resp 20 | Ht 74.5 in | Wt 219.0 lb

## 2012-07-23 DIAGNOSIS — K2970 Gastritis, unspecified, without bleeding: Secondary | ICD-10-CM

## 2012-07-23 DIAGNOSIS — R110 Nausea: Secondary | ICD-10-CM

## 2012-07-23 DIAGNOSIS — R1033 Periumbilical pain: Secondary | ICD-10-CM

## 2012-07-23 DIAGNOSIS — R079 Chest pain, unspecified: Secondary | ICD-10-CM

## 2012-07-23 DIAGNOSIS — K219 Gastro-esophageal reflux disease without esophagitis: Secondary | ICD-10-CM

## 2012-07-23 DIAGNOSIS — K2960 Other gastritis without bleeding: Secondary | ICD-10-CM

## 2012-07-23 MED ORDER — SUCRALFATE 1 G PO TABS: 1.0000 g | ORAL_TABLET | Freq: Two times a day (BID) | ORAL | Status: DC

## 2012-07-23 MED ORDER — SODIUM CHLORIDE 0.9 % IV SOLN: 500.0000 mL | INTRAVENOUS | Status: DC

## 2012-07-23 NOTE — Op Note (Signed)
[facility] [address] [place], [address]   ENDOSCOPY PROCEDURE REPORT  PATIENT: Jason, Charles.  MR#: [id number] BIRTHDATE: Aug 03, 1966 , 46  yrs. old GENDER: Male ENDOSCOPIST: Hart Carwin, MD REFERRED BY:  Roderick Pee, M.D. PROCEDURE DATE:  07/23/2012 PROCEDURE:  EGD w/ biopsy ASA CLASS:     Class II INDICATIONS:  periumbilical abdominal pain.   burning epigastric pain, hx pancreatitis, has taken Motrin,BC's, hx of EtOH, abd. ultrasound shows enlarged spleen. MEDICATIONS: MAC sedation, administered by CRNA and propofol (Diprivan) 400mg  IV TOPICAL ANESTHETIC: Cetacaine Spray  DESCRIPTION OF PROCEDURE: After the risks benefits and alternatives of the procedure were thoroughly explained, informed consent was obtained.  The LB GIF-H180 [id number] endoscope was introduced through the mouth and advanced to the second portion of the duodenum. Without limitations.  The instrument was slowly withdrawn as the mucosa was fully examined.        STOMACH: Moderate gastritis (inflammation) with hemorrhage was found in the gastric antrum.  A biopsy was performed using cold forceps. Retroflexed views revealed no abnormalities.     The scope was then withdrawn from the patient and the procedure completed.  COMPLICATIONS: There were no complications. ENDOSCOPIC IMPRESSION: Gastritis (inflammation) with hemorrhage was found in the gastric antrum; biopsy No esophageal varices Normal esophagus/g-e junction  RECOMMENDATIONS: 1.  No alcohol Hold Motrin,/ASA continue PPI add Carafate 1gm bid await biopsy results 2.  OV 4-6 weeks  REPEAT EXAM: no recall  eSigned:  Hart Carwin, MD 07/23/2012 9:30 AM   CC:  PATIENT NAME:  [name]. MR#: [id number]

## 2012-07-23 NOTE — Progress Notes (Signed)
Patient did not experience any of the following events: a burn prior to discharge; a fall within the facility; wrong site/side/patient/procedure/implant event; or a hospital transfer or hospital admission upon discharge from the facility. (G8907) Patient did not have preoperative order for IV antibiotic SSI prophylaxis. (G8918)  

## 2012-07-23 NOTE — Patient Instructions (Addendum)
Findings:  Gastritis Recommendations:  No alcohol, Hold aspirin and aspirin products.  Continue PPI and add carafate.  Wait for biopsy results and Office will call you to schedule appointment for 4-6 Weeks.  YOU HAD AN ENDOSCOPIC PROCEDURE TODAY AT THE [facility]: Refer to the procedure report that was given to you for any specific questions about what was found during the examination.  If the procedure report does not answer your questions, please call your gastroenterologist to clarify.  If you requested that your care partner not be given the details of your procedure findings, then the procedure report has been included in a sealed envelope for you to review at your convenience later.  YOU SHOULD EXPECT: Some feelings of bloating in the abdomen. Passage of more gas than usual.  Walking can help get rid of the air that was put into your GI tract during the procedure and reduce the bloating. If you had a lower endoscopy (such as a colonoscopy or flexible sigmoidoscopy) you may notice spotting of blood in your stool or on the toilet paper. If you underwent a bowel prep for your procedure, then you may not have a normal bowel movement for a few days.  DIET: Your first meal following the procedure should be a light meal and then it is ok to progress to your normal diet.  A half-sandwich or bowl of soup is an example of a good first meal.  Heavy or fried foods are harder to digest and may make you feel nauseous or bloated.  Likewise meals heavy in dairy and vegetables can cause extra gas to form and this can also increase the bloating.  Drink plenty of fluids but you should avoid alcoholic beverages for 24 hours.  ACTIVITY: Your care partner should take you home directly after the procedure.  You should plan to take it easy, moving slowly for the rest of the day.  You can resume normal activity the day after the procedure however you should NOT DRIVE or use heavy machinery for 24 hours (because  of the sedation medicines used during the test).    SYMPTOMS TO REPORT IMMEDIATELY: A gastroenterologist can be reached at any hour.  During normal business hours, 8:30 AM to 5:00 PM Monday through Friday, call [phone].  After hours and on weekends, please call the GI answering service at [phone] who will take a message and have the physician on call contact you.   Following lower endoscopy (colonoscopy or flexible sigmoidoscopy):  Excessive amounts of blood in the stool  Significant tenderness or worsening of abdominal pains  Swelling of the abdomen that is new, acute  Fever of 100F or higher  Following upper endoscopy (EGD)  Vomiting of blood or coffee ground material  New chest pain or pain under the shoulder blades  Painful or persistently difficult swallowing  New shortness of breath  Fever of 100F or higher  Black, tarry-looking stools  FOLLOW UP: If any biopsies were taken you will be contacted by phone or by letter within the next 1-3 weeks.  Call your gastroenterologist if you have not heard about the biopsies in 3 weeks.  Our staff will call the home number listed on your records the next business day following your procedure to check on you and address any questions or concerns that you may have at that time regarding the information given to you following your procedure. This is a courtesy call and so if there is no answer at the  home number and we have not heard from you through the emergency physician on call, we will assume that you have returned to your regular daily activities without incident.  SIGNATURES/CONFIDENTIALITY: You and/or your care partner have signed paperwork which will be entered into your electronic medical record.  These signatures attest to the fact that that the information above on your After Visit Summary has been reviewed and is understood.  Full responsibility of the confidentiality of this discharge information lies with you and/or  your care-partner.   Please follow all discharge instructions given to you by the recovery room nurse. If you have any questions or problems after discharge please call one of the numbers listed above. You will receive a phone call in the am to see how you are doing and answer any questions you may have. Thank you for choosing [facility] for your health care needs.

## 2012-07-24 ENCOUNTER — Telehealth: Payer: Self-pay | Admitting: *Deleted

## 2012-07-24 ENCOUNTER — Other Ambulatory Visit (INDEPENDENT_AMBULATORY_CARE_PROVIDER_SITE_OTHER): Payer: BC Managed Care – PPO

## 2012-07-24 DIAGNOSIS — Z0000 Encounter for general adult medical examination without abnormal findings: Secondary | ICD-10-CM

## 2012-07-24 LAB — BASIC METABOLIC PANEL
Calcium: 9 mg/dL (ref 8.4–10.5)
GFR: 90.58 mL/min (ref >60.00)
Glucose, Bld: 107 mg/dL — ABNORMAL HIGH (ref 70–99)
Sodium: 139 mEq/L (ref 135–145)

## 2012-07-24 LAB — CBC WITH DIFFERENTIAL/PLATELET
Basophils Absolute: 0 10*3/uL (ref 0.0–0.1)
Eosinophils Relative: 2.6 % (ref 0.0–5.0)
Hemoglobin: 16.2 g/dL (ref 13.0–17.0)
Lymphocytes Relative: 23.3 % (ref 12.0–46.0)
Monocytes Relative: 5.2 % (ref 3.0–12.0)
Neutro Abs: 3.3 10*3/uL (ref 1.4–7.7)
RBC: 5.41 Mil/uL (ref 4.22–5.81)
RDW: 12.2 % (ref 11.5–14.6)
WBC: 4.8 10*3/uL (ref 4.5–10.5)

## 2012-07-24 LAB — HEPATIC FUNCTION PANEL
AST: 21 U/L (ref 0–37)
Albumin: 4.4 g/dL (ref 3.5–5.2)
Alkaline Phosphatase: 55 U/L (ref 39–117)
Total Bilirubin: 0.9 mg/dL (ref 0.3–1.2)

## 2012-07-24 LAB — LIPID PANEL
HDL: 36.2 mg/dL — ABNORMAL LOW (ref >39.00)
Total CHOL/HDL Ratio: 6
Triglycerides: 247 mg/dL — ABNORMAL HIGH (ref 0.0–149.0)

## 2012-07-24 LAB — POCT URINALYSIS DIPSTICK
Bilirubin, UA: NEGATIVE
Blood, UA: NEGATIVE
Glucose, UA: NEGATIVE
Spec Grav, UA: 1.02

## 2012-07-24 NOTE — Telephone Encounter (Signed)
  Follow up Call-  Call back number 07/23/2012  Post procedure Call Back phone  # [phone]  Permission to leave phone message Yes     Patient questions:  Do you have a fever, pain , or abdominal swelling? no Pain Score  0 *  Have you tolerated food without any problems? yes  Have you been able to return to your normal activities? yes  Do you have any questions about your discharge instructions: Diet   no Medications  no Follow up visit  no  Do you have questions or concerns about your Care? no  Actions: * If pain score is 4 or above: No action needed, pain <4.

## 2012-07-28 ENCOUNTER — Encounter: Payer: Self-pay | Admitting: Internal Medicine

## 2012-07-28 ENCOUNTER — Telehealth: Payer: Self-pay | Admitting: Internal Medicine

## 2012-07-28 MED ORDER — DEXLANSOPRAZOLE 60 MG PO CPDR: 60.0000 mg | DELAYED_RELEASE_CAPSULE | Freq: Every day | ORAL | Status: DC

## 2012-07-28 NOTE — Telephone Encounter (Signed)
I have spoken to patient's wife and have asked that they contact patient's insurance company to give them options as to which PPI's will be covered. Wife verbalizes understanding. She also states that patient is out of Dexilant samples and requests a few more until insurance issue can be worked out. I have placed 3 boxes of Dexilant at the front desk for pick up.

## 2012-07-31 ENCOUNTER — Ambulatory Visit (INDEPENDENT_AMBULATORY_CARE_PROVIDER_SITE_OTHER): Payer: BC Managed Care – PPO | Admitting: Family Medicine

## 2012-07-31 ENCOUNTER — Encounter: Payer: Self-pay | Admitting: Family Medicine

## 2012-07-31 VITALS — BP 120/80 | Temp 98.2°F | Ht 74.25 in | Wt 220.0 lb

## 2012-07-31 DIAGNOSIS — F528 Other sexual dysfunction not due to a substance or known physiological condition: Secondary | ICD-10-CM

## 2012-07-31 DIAGNOSIS — F17200 Nicotine dependence, unspecified, uncomplicated: Secondary | ICD-10-CM

## 2012-07-31 DIAGNOSIS — E291 Testicular hypofunction: Secondary | ICD-10-CM

## 2012-07-31 DIAGNOSIS — Z0000 Encounter for general adult medical examination without abnormal findings: Secondary | ICD-10-CM

## 2012-07-31 DIAGNOSIS — K2100 Gastro-esophageal reflux disease with esophagitis, without bleeding: Secondary | ICD-10-CM

## 2012-07-31 DIAGNOSIS — E785 Hyperlipidemia, unspecified: Secondary | ICD-10-CM

## 2012-07-31 DIAGNOSIS — IMO0002 Reserved for concepts with insufficient information to code with codable children: Secondary | ICD-10-CM

## 2012-07-31 DIAGNOSIS — M2550 Pain in unspecified joint: Secondary | ICD-10-CM

## 2012-07-31 DIAGNOSIS — J309 Allergic rhinitis, unspecified: Secondary | ICD-10-CM

## 2012-07-31 DIAGNOSIS — F411 Generalized anxiety disorder: Secondary | ICD-10-CM

## 2012-07-31 MED ORDER — VARENICLINE TARTRATE 1 MG PO TABS: ORAL_TABLET | ORAL | Status: DC

## 2012-07-31 MED ORDER — BUPROPION HCL ER (SR) 100 MG PO TB12: ORAL_TABLET | ORAL | Status: DC

## 2012-07-31 MED ORDER — LOSARTAN POTASSIUM 50 MG PO TABS: ORAL_TABLET | ORAL | Status: DC

## 2012-07-31 MED ORDER — SIMVASTATIN 10 MG PO TABS: 10.0000 mg | ORAL_TABLET | Freq: Every day | ORAL | Status: DC

## 2012-07-31 MED ORDER — TRAMADOL HCL 50 MG PO TABS: ORAL_TABLET | ORAL | Status: DC

## 2012-07-31 MED ORDER — FLUTICASONE PROPIONATE 50 MCG/ACT NA SUSP: 1.0000 | Freq: Every day | NASAL | Status: DC

## 2012-07-31 MED ORDER — TESTOSTERONE 10 MG/ACT (2%) TD GEL: 4.0000 | TRANSDERMAL | Status: DC

## 2012-07-31 MED ORDER — TRIAMCINOLONE ACETONIDE 0.025 % EX CREA: TOPICAL_CREAM | Freq: Two times a day (BID) | CUTANEOUS | Status: DC

## 2012-07-31 NOTE — Progress Notes (Signed)
Subjective:    Patient ID: [name], male    DOB: [birth year], 46 y.o.   MRN: [id number]  HPI Cederic is a 46 year old male married,,,,,,,,, uses smokeless tobacco and he was able to stop Monday Chantix program however he stopped the Chantix program and now is using smokeless tobacco again,,,,,,,,,, who comes in today for general physical examination  He has a history of mild anxiety he takes Wellbutrin 200 mg daily  He takes a PPI for GI for chronic reflux  He takes Flonase nasal spray for allergic rhinitis  He uses Cozaar 50 mg daily for hypertension BP today 120/80  He was on simvastatin 10 mg daily however he has not been taking it recently. Cholesterol has risen  He takes tramadol 50 mg one half tab to one tablet 3 times daily for back and joint pain. He's had 3 injections in his left knee by an orthopedist in [facility]. He also has osteoarthritis.  He uses triamcinolone cream when necessary for eczema  He gets routine eye care, dental care, tetanus 2007, seasonal flu shot 2013   Review of Systems  Constitutional: Negative.   HENT: Negative.   Eyes: Negative.   Respiratory: Negative.   Cardiovascular: Negative.   Gastrointestinal: Negative.   Genitourinary: Negative.   Musculoskeletal: Positive for back pain and arthralgias.  Skin: Negative.   Neurological: Negative.   Hematological: Negative.   Psychiatric/Behavioral: Negative.        Objective:   Physical Exam  Constitutional: He is oriented to person, place, and time. He appears well-developed and well-nourished.  HENT:  Head: Normocephalic and atraumatic.  Right Ear: External ear normal.  Left Ear: External ear normal.  Nose: Nose normal.  Mouth/Throat: Oropharynx is clear and moist.  Eyes: Conjunctivae normal and EOM are normal. Pupils are equal, round, and reactive to light.  Neck: Normal range of motion. Neck supple. No JVD present. No tracheal deviation present. No thyromegaly present.    Cardiovascular: Normal rate, regular rhythm, normal heart sounds and intact distal pulses.  Exam reveals no gallop and no friction rub.   No murmur heard. Pulmonary/Chest: Effort normal and breath sounds normal. No stridor. No respiratory distress. He has no wheezes. He has no rales. He exhibits no tenderness.  Abdominal: Soft. Bowel sounds are normal. He exhibits no distension and no mass. There is no tenderness. There is no rebound and no guarding.  Genitourinary: Prostate normal and penis normal. Guaiac positive stool. No penile tenderness.  Musculoskeletal: Normal range of motion. He exhibits no edema and no tenderness.  Lymphadenopathy:    He has no cervical adenopathy.  Neurological: He is alert and oriented to person, place, and time. He has normal reflexes. No cranial nerve deficit. He exhibits normal muscle tone.  Skin: Skin is warm and dry. No rash noted. No erythema. No pallor.  Psychiatric: He has a normal mood and affect. His behavior is normal. Judgment and thought content normal.          Assessment & Plan:  Healthy male  Positive stool guaiac will ask Dr. Dickie La to address that issue  History of anxiety continue Wellbutrin 200 mg daily  Continued uses smokeless tobacco restart the Chantix program  Hypertension continue Cozaar 50 mg daily  Hyperlipidemia restart Zocor 10 mg daily  Low testosterone continue testosterone supplement  Diffuse osteoarthritis continue tramadol 50 mg one half tab 3 times a day when necessary recommend he also get a rheumatology consult from Dr. Azzie Roup and group  History of eczema and uses triamcinolone cream

## 2012-07-31 NOTE — Patient Instructions (Signed)
Continue your current medications  Do not take any aspirin or aspirin products  Restart the Chantix program by taking a half a tablet daily x2-3 months  Followup with Dr. Dickie La as outlined  I would consider a rheumatology consult with Dr. Azzie Roup  Return in one year sooner if any problems

## 2012-08-14 ENCOUNTER — Telehealth: Payer: Self-pay | Admitting: Internal Medicine

## 2012-08-14 MED ORDER — DEXLANSOPRAZOLE 60 MG PO CPDR: 60.0000 mg | DELAYED_RELEASE_CAPSULE | Freq: Every day | ORAL | Status: DC

## 2012-08-14 NOTE — Telephone Encounter (Signed)
[facility] Apparently, patient's insurance company never sent a letter to patient to let them know that Dexilant has been approved. Advised insurance approved the Dexilant starting 07/29/12

## 2012-08-21 ENCOUNTER — Telehealth: Payer: Self-pay | Admitting: Family Medicine

## 2012-08-21 DIAGNOSIS — M25562 Pain in left knee: Secondary | ICD-10-CM

## 2012-08-21 NOTE — Telephone Encounter (Signed)
Pt was in to see Dr. Tawanna Cooler 12/17 & an appt with Rheumatology was discussed. Pt needs a referral - would like it to be for Dr. Azzie Roup. Thanks.

## 2012-08-26 ENCOUNTER — Encounter: Payer: Self-pay | Admitting: Internal Medicine

## 2012-08-26 ENCOUNTER — Ambulatory Visit (INDEPENDENT_AMBULATORY_CARE_PROVIDER_SITE_OTHER): Payer: BC Managed Care – PPO | Admitting: Internal Medicine

## 2012-08-26 VITALS — BP 112/80 | HR 80 | Ht 74.25 in | Wt 220.8 lb

## 2012-08-26 DIAGNOSIS — K2960 Other gastritis without bleeding: Secondary | ICD-10-CM

## 2012-08-26 DIAGNOSIS — R1013 Epigastric pain: Secondary | ICD-10-CM

## 2012-08-26 MED ORDER — DICYCLOMINE HCL 20 MG PO TABS: 20.0000 mg | ORAL_TABLET | Freq: Two times a day (BID) | ORAL | Status: DC | PRN

## 2012-08-26 MED ORDER — OMEPRAZOLE 40 MG PO CPDR: 40.0000 mg | DELAYED_RELEASE_CAPSULE | Freq: Two times a day (BID) | ORAL | Status: DC

## 2012-08-26 NOTE — Patient Instructions (Addendum)
We have sent the following medications to your pharmacy for you to pick up at your convenience: Prilosec Bentyl  CC: Dr Kelle Darting

## 2012-08-26 NOTE — Progress Notes (Signed)
[name] [facility] 08-31-1966 MRN [id number]  History of Present Illness:  This is a 46 year old white male with epigastric burning. His last appointment was in October 2013. An upper endoscopy showed H. pylori negative chronic active gastritis. His upper abdominal ultrasound in September 2013 showed a 14 cm spleen, 4 mm common bile duct and normal-appearing liver. His liver function tests are normal. He has stopped using excess alcohol. There is a history of pancreatitis but he has not been actively drinking. He was switched from Prilosec to Dexilant 60 mg daily. He is much improved although the pain still appears several times a week. The pain is relieved by food. He no longer takes excess aspirin for his arthritis (he is a Music therapist). He is on Carafate 1 g twice a day.   Past Medical History  Diagnosis Date  . HYPERLIPIDEMIA   . ANXIETY   . ERECTILE DYSFUNCTION   . HYPERTENSION   . Other acute sinusitis   . ALLERGIC RHINITIS   . ASTHMA   . GERD   . SHOULDER PAIN, LEFT   . Pain in joint, hand   . NECK PAIN   . BACK PAIN   . TENDINITIS, PATELLAR   . LUMBAR STRAIN   . HYPERTENSION NEC   . PANCREATITIS, HX OF   . Substance abuse     quit in 2007   Past Surgical History  Procedure Date  . Knee arthroscopy 2008    Rt.    reports that he has never smoked. His smokeless tobacco use includes Chew. He reports that he does not drink alcohol or use illicit drugs. family history includes Lung disease in his father and Melanoma in his mother. No Known Allergies      Review of Systems: Denies dysphagia positive for epigastric burning negative for rectal bleeding  The remainder of the 10 point ROS is negative except as outlined in H&P   Physical Exam: General appearance  Well developed, in no distress. Eyes- non icteric. HEENT nontraumatic, normocephalic. Mouth no lesions, tongue papillated, no cheilosis. Neck supple without adenopathy, thyroid not enlarged, no carotid bruits, no  JVD. Lungs Clear to auscultation bilaterally. Cor normal S1, normal S2, regular rhythm, no murmur,  quiet precordium. Abdomen: Normal exam. Normal active bowel sounds. Liver edge at costal margin. No tenderness. No scars. Rectal: Not done. Extremities no pedal edema. Skin no lesions. Neurological alert and oriented x 3. Psychological normal mood and affect.  Assessment and Plan:  Problem #1 Persistent epigastric burning, which is improved on the regimen of PPI and Carafate. He is about 50% improved. He would like to switch back to Prilosec because of the cost. We will send Prilosec 40 mg twice a day. He will stop Carafate and start Bentyl 20 mg twice a day and when necessary. He will stay off aspirin and anti-inflammatory agents for now. He has an upcoming appointment with Dr. Dareen Piano for evaluation of joint pains.   08/26/2012 [name]

## 2012-09-09 ENCOUNTER — Telehealth: Payer: Self-pay | Admitting: Family Medicine

## 2012-09-09 MED ORDER — PREDNISONE 20 MG PO TABS: 20.0000 mg | ORAL_TABLET | Freq: Every day | ORAL | Status: DC

## 2012-09-09 NOTE — Telephone Encounter (Signed)
Pt came into office with a sinus inf. Pt had since Sat. Would like to get script for Prednisone. [facility] [address]

## 2012-09-09 NOTE — Telephone Encounter (Signed)
Pt aware/kjh 

## 2012-09-09 NOTE — Telephone Encounter (Signed)
Prednisone sent to pharmacy per Dr Tawanna Cooler.  Please inform the patient.

## 2012-10-23 ENCOUNTER — Other Ambulatory Visit: Payer: Self-pay | Admitting: Family Medicine

## 2012-11-01 ENCOUNTER — Ambulatory Visit (INDEPENDENT_AMBULATORY_CARE_PROVIDER_SITE_OTHER): Payer: BC Managed Care – PPO | Admitting: Internal Medicine

## 2012-11-01 ENCOUNTER — Encounter: Payer: Self-pay | Admitting: Internal Medicine

## 2012-11-01 VITALS — BP 118/74 | HR 74 | Temp 97.8°F | Resp 16 | Wt 215.5 lb

## 2012-11-01 DIAGNOSIS — J309 Allergic rhinitis, unspecified: Secondary | ICD-10-CM

## 2012-11-01 DIAGNOSIS — J069 Acute upper respiratory infection, unspecified: Secondary | ICD-10-CM

## 2012-11-01 MED ORDER — FLUTICASONE PROPIONATE 50 MCG/ACT NA SUSP: 1.0000 | Freq: Every day | NASAL | Status: DC

## 2012-11-01 MED ORDER — AMOXICILLIN 500 MG PO CAPS: 1000.0000 mg | ORAL_CAPSULE | Freq: Two times a day (BID) | ORAL | Status: DC

## 2012-11-01 NOTE — Patient Instructions (Addendum)
Rest, fluids , tylenol For cough, take Mucinex DM twice a day as needed  For congestion use flonase once a day Continue with nasal irrigations I sent a prescription for a Amoxicillin, an antibiotic, use it only if no better in 3-4 days Albuterol only if wheezing No need for prednisone Call if no better in few days Call anytime if the symptoms are severe

## 2012-11-01 NOTE — Progress Notes (Signed)
  Subjective:    Patient ID: [name], male    DOB: [birth year], 47 y.o.   MRN: [id number]  HPI Saturday clinic, acute visit. Symptoms started 3-4 days ago: Sinus congestion, frontal headaches, some hoarseness and cough. Patient is taking a leftover prednisone for the last day.   Past Medical History  Diagnosis Date  . HYPERLIPIDEMIA   . ANXIETY   . ERECTILE DYSFUNCTION   . HYPERTENSION   . Other acute sinusitis   . ALLERGIC RHINITIS   . ASTHMA   . GERD   . SHOULDER PAIN, LEFT   . Pain in joint, hand   . NECK PAIN   . BACK PAIN   . TENDINITIS, PATELLAR   . LUMBAR STRAIN   . HYPERTENSION NEC   . PANCREATITIS, HX OF   . Substance abuse     quit in 2007   Past Surgical History  Procedure Laterality Date  . Knee arthroscopy  2008    Rt.     Review of Systems Denies fever chills. Note nausea, vomiting, diarrhea. Very small the sputum production, brown in color, only early in the morning. Some nasal discharge.     Objective:   Physical Exam General -- alert, well-developed  HEENT -- TMs normal, throat w/o redness, face symmetric and not tender to palpation, nose slt congested   Lungs -- normal respiratory effort, no intercostal retractions, no accessory muscle use, and normal breath sounds.   Heart-- normal rate, regular rhythm, no murmur, and no gallop.   Neurologic-- alert & oriented X3 and strength normal in all extremities. Psych-- Cognition and judgment appear intact. Alert and cooperative with normal attention span and concentration.  not anxious appearing and not depressed appearing.       Assessment & Plan:   URI, 47 year old gentleman with hypertension, high cholesterol, and GERD, tobacco user (dip , no smoking) who presents with upper respiratory symptoms for few days. No history of asthma per se, was prescribed albuterol and prednisone in the past for a episode of wheezing. Plan: see instructions

## 2012-11-03 ENCOUNTER — Telehealth: Payer: Self-pay | Admitting: Family Medicine

## 2012-11-03 NOTE — Telephone Encounter (Signed)
[facility] Triage Call Report Triage Record Num: [id number] Operator: Albertine Grates Patient Name: [name], [name] Call Date & Time: 10/31/2012 10:36:15PM Patient Phone: [phone] PCP: T[facility] Patient Gender: Male PCP Fax : [phone] Patient DOB: 08/09/1966 [facility] Name: [facility] Reason for Call: Caller: [name]; PCP: [name] (Family Practice); CB#: [phone]; Has nasal congestion with cough since 2-18. Afebrile. Has yellow/brown discharge from nose and pressure behind eyes/nose. Per Upper Respratory Infection protocol, appointment scheduled for 2-22 at 0915 due to sinus pressure. Protocol(s) Used: Upper Respiratory Infection (URI) Recommended Outcome per Protocol: See Provider within 24 hours Reason for Outcome: Pressure/pain above or below eyes, near ears over sinuses (may also be described as fullness, worsens when bending forward, teeth or eye pain) AND yellow-green drainage from nose or down back of throat. Care Advice: ~ Consider use of a saline nasal spray per package directions to help relieve nasal congestion. 02/

## 2012-11-13 ENCOUNTER — Other Ambulatory Visit: Payer: Self-pay | Admitting: Family Medicine

## 2013-01-17 ENCOUNTER — Other Ambulatory Visit: Payer: Self-pay | Admitting: Family Medicine

## 2013-04-15 ENCOUNTER — Telehealth: Payer: Self-pay | Admitting: Family Medicine

## 2013-04-15 MED ORDER — PREDNISONE (PAK) 10 MG PO TABS: 10.0000 mg | ORAL_TABLET | Freq: Every day | ORAL | Status: DC

## 2013-04-15 NOTE — Telephone Encounter (Signed)
Caller: [name]/Spouse; Phone: [phone]; Reason for Call: Wife [name] is calling in for her husband [name].  He is requesting a prednisone Rx.  Declines Nurse Triage.  He has start of sinus infection and they are going out of town to [place], [place].  He has sinus pressure and pain.  [facility] [address] if you can fill this afternoon by 5pm.  .  .  [facility] [address] in [place] -  Please contact wife for any questions.  [phone]

## 2013-04-15 NOTE — Telephone Encounter (Signed)
Call in a Medrol dose pack  

## 2013-05-21 ENCOUNTER — Other Ambulatory Visit: Payer: Self-pay | Admitting: Family Medicine

## 2013-05-27 ENCOUNTER — Other Ambulatory Visit: Payer: Self-pay | Admitting: Family Medicine

## 2013-07-08 ENCOUNTER — Other Ambulatory Visit: Payer: Self-pay | Admitting: Family Medicine

## 2013-08-02 ENCOUNTER — Other Ambulatory Visit: Payer: Self-pay | Admitting: Internal Medicine

## 2013-08-08 ENCOUNTER — Other Ambulatory Visit: Payer: Self-pay | Admitting: Family Medicine

## 2013-08-18 ENCOUNTER — Other Ambulatory Visit: Payer: Self-pay | Admitting: Family Medicine

## 2013-08-19 NOTE — Telephone Encounter (Signed)
I am not his doctor. Please forward this to Dr. Tawanna Cooler

## 2013-08-23 ENCOUNTER — Other Ambulatory Visit: Payer: Self-pay | Admitting: Family Medicine

## 2013-09-12 ENCOUNTER — Other Ambulatory Visit: Payer: Self-pay | Admitting: Family Medicine

## 2013-09-15 ENCOUNTER — Other Ambulatory Visit (INDEPENDENT_AMBULATORY_CARE_PROVIDER_SITE_OTHER): Payer: Managed Care, Other (non HMO)

## 2013-09-15 DIAGNOSIS — Z0000 Encounter for general adult medical examination without abnormal findings: Secondary | ICD-10-CM

## 2013-09-15 LAB — LIPID PANEL
CHOLESTEROL: 191 mg/dL (ref 0–200)
HDL: 42.8 mg/dL (ref >39.00)
LDL CALC: 131 mg/dL — AB (ref 0–99)
Total CHOL/HDL Ratio: 4
Triglycerides: 86 mg/dL (ref 0.0–149.0)
VLDL: 17.2 mg/dL (ref 0.0–40.0)

## 2013-09-15 LAB — CBC WITH DIFFERENTIAL/PLATELET
Basophils Absolute: 0 10*3/uL (ref 0.0–0.1)
Basophils Relative: 0.5 % (ref 0.0–3.0)
EOS ABS: 0.2 10*3/uL (ref 0.0–0.7)
Eosinophils Relative: 3 % (ref 0.0–5.0)
HEMATOCRIT: 43.8 % (ref 39.0–52.0)
Hemoglobin: 15.2 g/dL (ref 13.0–17.0)
LYMPHS ABS: 1.6 10*3/uL (ref 0.7–4.0)
Lymphocytes Relative: 31.2 % (ref 12.0–46.0)
MCHC: 34.7 g/dL (ref 30.0–36.0)
MCV: 87.9 fl (ref 78.0–100.0)
MONO ABS: 0.3 10*3/uL (ref 0.1–1.0)
Monocytes Relative: 5.6 % (ref 3.0–12.0)
Neutro Abs: 3.1 10*3/uL (ref 1.4–7.7)
Neutrophils Relative %: 59.7 % (ref 43.0–77.0)
PLATELETS: 174 10*3/uL (ref 150.0–400.0)
RBC: 4.98 Mil/uL (ref 4.22–5.81)
RDW: 12.8 % (ref 11.5–14.6)
WBC: 5.2 10*3/uL (ref 4.5–10.5)

## 2013-09-15 LAB — HEPATIC FUNCTION PANEL
ALBUMIN: 4.5 g/dL (ref 3.5–5.2)
ALT: 72 U/L — AB (ref 0–53)
AST: 26 U/L (ref 0–37)
Alkaline Phosphatase: 65 U/L (ref 39–117)
BILIRUBIN TOTAL: 0.9 mg/dL (ref 0.3–1.2)
Bilirubin, Direct: 0.1 mg/dL (ref 0.0–0.3)
Total Protein: 7 g/dL (ref 6.0–8.3)

## 2013-09-15 LAB — BASIC METABOLIC PANEL
BUN: 18 mg/dL (ref 6–23)
CALCIUM: 9.1 mg/dL (ref 8.4–10.5)
CO2: 29 mEq/L (ref 19–32)
Chloride: 102 mEq/L (ref 96–112)
Creatinine, Ser: 1 mg/dL (ref 0.4–1.5)
GFR: 90.13 mL/min (ref >60.00)
GLUCOSE: 114 mg/dL — AB (ref 70–99)
POTASSIUM: 4 meq/L (ref 3.5–5.1)
Sodium: 138 mEq/L (ref 135–145)

## 2013-09-15 LAB — TSH: TSH: 4.56 u[IU]/mL (ref 0.35–5.50)

## 2013-09-22 ENCOUNTER — Encounter: Payer: Self-pay | Admitting: Family Medicine

## 2013-09-22 ENCOUNTER — Ambulatory Visit (INDEPENDENT_AMBULATORY_CARE_PROVIDER_SITE_OTHER): Payer: Managed Care, Other (non HMO) | Admitting: Family Medicine

## 2013-09-22 VITALS — BP 120/88 | Temp 98.4°F | Ht 74.5 in | Wt 222.0 lb

## 2013-09-22 DIAGNOSIS — Z0000 Encounter for general adult medical examination without abnormal findings: Secondary | ICD-10-CM

## 2013-09-22 DIAGNOSIS — F411 Generalized anxiety disorder: Secondary | ICD-10-CM

## 2013-09-22 DIAGNOSIS — Z23 Encounter for immunization: Secondary | ICD-10-CM

## 2013-09-22 DIAGNOSIS — F528 Other sexual dysfunction not due to a substance or known physiological condition: Secondary | ICD-10-CM

## 2013-09-22 DIAGNOSIS — J309 Allergic rhinitis, unspecified: Secondary | ICD-10-CM

## 2013-09-22 DIAGNOSIS — F17200 Nicotine dependence, unspecified, uncomplicated: Secondary | ICD-10-CM

## 2013-09-22 DIAGNOSIS — E785 Hyperlipidemia, unspecified: Secondary | ICD-10-CM

## 2013-09-22 DIAGNOSIS — IMO0002 Reserved for concepts with insufficient information to code with codable children: Secondary | ICD-10-CM

## 2013-09-22 DIAGNOSIS — K219 Gastro-esophageal reflux disease without esophagitis: Secondary | ICD-10-CM

## 2013-09-22 DIAGNOSIS — E291 Testicular hypofunction: Secondary | ICD-10-CM

## 2013-09-22 DIAGNOSIS — K2100 Gastro-esophageal reflux disease with esophagitis, without bleeding: Secondary | ICD-10-CM

## 2013-09-22 DIAGNOSIS — I10 Essential (primary) hypertension: Secondary | ICD-10-CM

## 2013-09-22 DIAGNOSIS — J45909 Unspecified asthma, uncomplicated: Secondary | ICD-10-CM

## 2013-09-22 MED ORDER — LOSARTAN POTASSIUM 50 MG PO TABS: ORAL_TABLET | ORAL | Status: DC

## 2013-09-22 MED ORDER — VARENICLINE TARTRATE 1 MG PO TABS: ORAL_TABLET | ORAL | Status: DC

## 2013-09-22 MED ORDER — SIMVASTATIN 10 MG PO TABS: ORAL_TABLET | ORAL | Status: DC

## 2013-09-22 MED ORDER — BUPROPION HCL ER (SR) 100 MG PO TB12: ORAL_TABLET | ORAL | Status: DC

## 2013-09-22 MED ORDER — OMEPRAZOLE 40 MG PO CPDR: DELAYED_RELEASE_CAPSULE | ORAL | Status: DC

## 2013-09-22 MED ORDER — FLUTICASONE PROPIONATE 50 MCG/ACT NA SUSP: 1.0000 | Freq: Every day | NASAL | Status: DC

## 2013-09-22 NOTE — Progress Notes (Signed)
   Subjective:    Patient ID: [name], male    DOB: 11/19/1965, 48 y.o.   MRN: [id number]  HPI [name] is a 48 year old male nonsmoker,,,,,, uses smokeless tobacco,,,,, who comes in today for general physical examination because of a history of mild anxiety, smokeless tobacco use, allergic rhinitis, hypertension, reflux esophagitis, low testosterone, hyperlipidemia  His medications were reviewed in detail and is not currently taking this Chantix. He did in the past and it helped he would like to restart it. Also in the past he took a testosterone supplement and would like to pursue that. I recommend we get an endocrinology consult  He gets routine eye care, dental care, vaccinations up-to-date   Review of Systems  Constitutional: Negative.   HENT: Negative.   Eyes: Negative.   Respiratory: Negative.   Cardiovascular: Negative.   Gastrointestinal: Negative.   Endocrine: Negative.   Genitourinary: Negative.   Musculoskeletal: Negative.   Skin: Negative.   Allergic/Immunologic: Negative.   Neurological: Negative.   Hematological: Negative.   Psychiatric/Behavioral: Negative.        Objective:   Physical Exam  Nursing note and vitals reviewed. Constitutional: He is oriented to person, place, and time. He appears well-developed and well-nourished.  HENT:  Head: Normocephalic and atraumatic.  Right Ear: External ear normal.  Left Ear: External ear normal.  Nose: Nose normal.  Mouth/Throat: Oropharynx is clear and moist.  Eyes: Conjunctivae and EOM are normal. Pupils are equal, round, and reactive to light. Right eye exhibits no discharge. Left eye exhibits no discharge. No scleral icterus.  Neck: Normal range of motion. Neck supple. No JVD present. No tracheal deviation present. No thyromegaly present.  Cardiovascular: Normal rate, regular rhythm, normal heart sounds and intact distal pulses.  Exam reveals no gallop and no friction rub.   No murmur heard. Pulmonary/Chest:  Effort normal and breath sounds normal. No stridor. No respiratory distress. He has no wheezes. He has no rales. He exhibits no tenderness.  Abdominal: Soft. Bowel sounds are normal. He exhibits no distension and no mass. There is no tenderness. There is no rebound and no guarding.  Genitourinary: Rectum normal, prostate normal and penis normal. Guaiac negative stool. No penile tenderness.  Musculoskeletal: Normal range of motion. He exhibits no edema and no tenderness.  Lymphadenopathy:    He has no cervical adenopathy.  Neurological: He is alert and oriented to person, place, and time. He has normal reflexes. No cranial nerve deficit. He exhibits normal muscle tone.  Skin: Skin is warm and dry. No rash noted. No erythema. No pallor.  Psychiatric: He has a normal mood and affect. His behavior is normal. Judgment and thought content normal.          Assessment & Plan:  Healthy male  History of mild anxiety continue Wellbutrin  Hypertension at goal continue current therapy  Reflux esophagitis continue omeprazole daily  Hyperlipidemia continue simvastatin 10 mg daily  History of asthma albuterol when necessary  History of low testosterone and no consult  Smokeless tobacco use restart Chantix program  Status post cartilage surgery left knee in December 2014

## 2013-09-22 NOTE — Progress Notes (Signed)
Pre visit review using our clinic review tool, if applicable. No additional management support is needed unless otherwise documented below in the visit note. 

## 2013-09-22 NOTE — Patient Instructions (Signed)
Continue your current medications  Restart the Chantix program one half tab daily  We will get you set up a consult and endocrinology  Return in one year for general physical exam sooner if any problems

## 2013-09-25 ENCOUNTER — Telehealth: Payer: Self-pay | Admitting: Family Medicine

## 2013-09-25 NOTE — Telephone Encounter (Signed)
Pt requesting to get Cortizone injection for golfers elbows, states Dr. Sherren Mocha has not ordered this but that he' s got these before.

## 2013-09-25 NOTE — Telephone Encounter (Signed)
Per Dr Sherren Mocha he does not do cortizone injection.  Patient should go to Ortho.  Spoke with patient and he already sees an Ortho in [place].

## 2013-10-02 ENCOUNTER — Encounter: Payer: Self-pay | Admitting: Internal Medicine

## 2013-10-02 ENCOUNTER — Ambulatory Visit (INDEPENDENT_AMBULATORY_CARE_PROVIDER_SITE_OTHER): Payer: Managed Care, Other (non HMO) | Admitting: Internal Medicine

## 2013-10-02 VITALS — BP 122/82 | HR 86 | Temp 98.5°F | Resp 12 | Ht 74.0 in | Wt 221.0 lb

## 2013-10-02 DIAGNOSIS — E291 Testicular hypofunction: Secondary | ICD-10-CM

## 2013-10-02 LAB — HEMOGLOBIN A1C: Hgb A1c MFr Bld: 5.7 % (ref 4.6–6.5)

## 2013-10-02 NOTE — Patient Instructions (Signed)
Please stop at the lab. If tests are abnormal, we will need to check some more tests to see why you have the low testosterone. I will let you know the results as soon as I have them. We will schedule an appointment to return if labs are abnormal.

## 2013-10-02 NOTE — Progress Notes (Signed)
Patient ID: [name] [facility], male   DOB: 1965/11/26, 48 y.o.   MRN: [id number]  HPI: [name] is a 48 y.o.-year-old man, referred by his PCP, Dr. Sherren Mocha, for evaluation and management of low testosterone.  He was dx with low testosterone in 2012. Per chart, before starting T gel, his total T level was 238.87 (250-890), however, I believe this was not drawn in am, although it is difficult to determine. No further investigation was performed at that time. No h/o po or intraarticular steroids, narcotics, or SSRIs. He was started on testosterone gel. No subsequent T levels are available for review. A PSA was 0.93 in 2011. He tells me that he took the T gel Harlene Salts) - 2 x10 mg at night daily, although, per chart, he was taking 4 x10 mg nightly.  He admits for some decreased libido, not all the time Has occasional difficulty obtaining or maintaining an erection, not always No trauma to testes, testicular irradiation or surgery No h/o of mumps orchitis/h/o autoimmune ds. No h/o cryptorchidism He grew and went through puberty like his peers No shrinking of testes. No very small testes (<5 ml).    No breast discomfort/gynecomastia    No loss of body hair (axillary/pubic)/decreased need for shaving + height loss: 6'3" >> 6"2" No abnormal sense of smell  No hot flushes No vision problems No worst HA of his life No FH of hypogonadism/infertility (but does not have children by choice) No personal h/o infertility - has children No FH of hemochromatosis or pituitary tumors No excessive weight gain or loss.  No chronic diseases No chronic pain. Not on opiates, does not take steroids.  No more than 2 drinks a day of alcohol at a time now >> was an alcoholic, sober since [id number] No anabolic steroids use No herbal medicines   Has AI ds in his family (RA in [place]). He does have family history of early cardiac disease in maternal aunt: cardiac surgery in late 58s. No h/o  AMI/strokes).  ROS: Constitutional: no weight gain/loss, no fatigue, no subjective hyperthermia/hypothermia Eyes: no blurry vision, no xerophthalmia ENT: no sore throat, no nodules palpated in throat, no dysphagia/odynophagia, no hoarseness Cardiovascular: no CP/SOB/palpitations/leg swelling Respiratory: no cough/SOB Gastrointestinal: no N/V/D/C Musculoskeletal: no muscle/+ joint aches Skin: no rashes Neurological: no tremors/numbness/tingling/dizziness Psychiatric: no depression/+ anxiety + see HPI  Past Medical History  Diagnosis Date  . HYPERLIPIDEMIA   . ANXIETY   . ERECTILE DYSFUNCTION   . HYPERTENSION   . Other acute sinusitis   . ALLERGIC RHINITIS   . ASTHMA   . GERD   . SHOULDER PAIN, LEFT   . Pain in joint, hand   . NECK PAIN   . BACK PAIN   . TENDINITIS, PATELLAR   . LUMBAR STRAIN   . HYPERTENSION NEC   . PANCREATITIS, HX OF   . Substance abuse     quit in 2007   Past Surgical History  Procedure Laterality Date  . Knee arthroscopy  2008    Rt.   History   Social History  . Marital Status: Married    Spouse Name: N/A    Number of Children: 0        Occupational History  . carpenter   Social History Main Topics  . Smoking status: Never Smoker   . Smokeless tobacco: Current User    Types: Chew  . Alcohol Use: No     Comment: hx alcohol abuse  . Drug Use: No  Current Outpatient Prescriptions on File Prior to Visit  Medication Sig Dispense Refill  . albuterol (VENTOLIN HFA) 108 (90 BASE) MCG/ACT inhaler Inhale 2 puffs into the lungs every 6 (six) hours as needed for wheezing. Every 4 hours  18 Inhaler  2  . buPROPion (WELLBUTRIN SR) 100 MG 12 hr tablet 2 by mouth every morning  200 tablet  3  . dicyclomine (BENTYL) 20 MG tablet Take 1 tablet (20 mg total) by mouth 2 (two) times daily as needed.  60 tablet  4  . fluticasone (FLONASE) 50 MCG/ACT nasal spray Place 1 spray into both nostrils daily.  16 g  11  . FORTESTA 10 MG/ACT (2%) GEL APPLY 4  PUMPS TO SKIN DAILY  60 g  0  . losartan (COZAAR) 50 MG tablet TAKE 1 TABLET BY MOUTH ONCE DAILY  100 tablet  3  . omeprazole (PRILOSEC) 40 MG capsule TAKE ONE CAPSULE TWICE A DAY  100 capsule  3  . simvastatin (ZOCOR) 10 MG tablet TAKE 1 TABLET BY MOUTH AT BEDTIME  100 tablet  3  . traMADol (ULTRAM) 50 MG tablet TAKE 1/2-1 TABLET 3 TIMES DAILY AS NEEDED FOR PAIN  100 tablet  2  . triamcinolone (KENALOG) 0.025 % cream Apply topically 2 (two) times daily.  30 g  5  . varenicline (CHANTIX CONTINUING MONTH PAK) 1 MG tablet 1 tablets by mouth every morning  30 tablet  5   No current facility-administered medications on file prior to visit.   No Known Allergies Family History  Problem Relation Age of Onset  . Lung disease Father   . Melanoma Mother    PE: BP 122/82  Pulse 86  Temp(Src) 98.5 F (36.9 C) (Oral)  Resp 12  Ht 6\' 2"  (1.88 m)  Wt 221 lb (100.245 kg)  BMI 28.36 kg/m2  SpO2 97% Wt Readings from Last 3 Encounters:  10/02/13 221 lb (100.245 kg)  09/22/13 222 lb (100.699 kg)  11/01/12 215 lb 8 oz (97.75 kg)   Constitutional: overweight, in NAD Eyes: PERRLA, EOMI, no exophthalmos ENT: moist mucous membranes, no thyromegaly, no cervical lymphadenopathy Cardiovascular: RRR, No MRG Respiratory: CTA B Gastrointestinal: abdomen soft, NT, ND, BS+ Musculoskeletal: no deformities, strength intact in all 4 Skin: moist, warm, no rashes Neurological: mild tremor with outstretched hands, DTR normal in all 4 Genital exam: normal male escutcheon, no inguinal LAD, normal phallus, testes ~30 mL, no testicular masses, no penile discharge.  No gynecomastia.  ASSESSMENT: 1. Low testosterone   PLAN:  1. Low total testosterone - unclear cause in such a young man. His alcoholism might have contributed, but he is sober since 2007... - I explained that the total testosterone consists of free and bound testosterone. The total testosterone can be low: - later in the afternoon  - after  copious meals - with stress - with inconsistent circadian rhythm - when the testes do not secrete enough testosterone (in this case the free testosterone is low, too)   - when the SHBG is low (in this case the free testosterone is normal) In his case, the lower SHBG is most likely the reason for the pt's repeatedly low total T. The treatment for this is not exogenous testosterone, but improving his sleep, diet, and exercise. Therefore, if a free testosterone is in the normal range, testosterone treatment is not indicated, and might even be harmful as per the latest studies that showed increased cardiac risk with testosterone supplementation. - I advised him that testosterone  is a controlled substance and, per guidelines, is only recommended for pts with clearly low testosterone. There is also no evidence that increasing the testosterone within the normal range will help with either erections or his energy. Moreover, the normal range is not spanning different ages, but it is determined in young men, at 8-9 am, fasting. Therefore, a free T level of 40 is not "the testosterone level of an 80-y/o man", but can be perfectly normal in a young man, too. - we also discussed about controlling his diet (advised him to cut down fat and meat and increase the use of fruit and vegetables), improve his cholesterol, improve his sleep, exercise regularly. - We will check a T level today (~8:30 am, had oatmeal >2h ago) and will add a HbA1c since he had higher fasting sugars in the past. If free T is abnormal, will need labs to clarify if central or primary hypogonadism. Only afterwards we can start T gel back. Discussed that if we start testosterone back, we will also ned a PSA, CBC, and a DRE. These will need to be checked throughout his tx period, at least once a year.  Office Visit on 10/02/2013  Component Date Value Range Status  . Testosterone 10/02/2013 202* 300 - 890 ng/dL Final   Comment:           Tanner Stage        Male              Male                                        I              < 30 ng/dL        < 10 ng/dL                                        II             < 150 ng/dL       < 30 ng/dL                                        III            100-320 ng/dL     < 35 ng/dL                                        IV             200-970 ng/dL     15-40 ng/dL                                        V/Adult        300-890 ng/dL     10-70 ng/dL                             . Sex Hormone Binding 10/02/2013 31  13 - 71 nmol/L  Final  . Testosterone, Free 10/02/2013 39.8* 47.0 - 244.0 pg/mL Final   Comment:                            The concentration of free testosterone is derived from a mathematical                          expression based on constants for the binding of testosterone to sex                          hormone-binding globulin and albumin.  . Testosterone-% Free 10/02/2013 2.0  1.6 - 2.9 % Final  . Hemoglobin A1C 10/02/2013 5.7  4.6 - 6.5 % Final   Glycemic Control Guidelines for People with Diabetes:Non Diabetic:  <6%Goal of Therapy: <7%Additional Action Suggested:  >8%    Low free Testosterone >> look for cause - will order the following: Orders Placed This Encounter  Procedures  . Testosterone, free, total  . HgB A1c  . LH  . [place]  . IBC panel  . Insulin-like growth factor  . Cortisol  . ACTH  . Estradiol  . hCG, quantitative, pregnancy  . PSA   October 26, 2013   [place] [id number]   Dear [name],  Below are the results from your recent visit:  LUTEINIZING HORMONE      Result Value Ref Range   LH 2.63  1.50 - 5.70 mIU/mL  FOLLICLE STIMULATING HORMONE      Result Value Ref Range   FSH 2.5  1.4 - 18.1 mIU/ML  IBC PANEL      Result Value Ref Range   Iron 100  42 - 165 ug/dL   Transferrin 224.3  212.0 - 360.0 mg/dL   Saturation Ratios 31.8  20.0 - 50.0 %  CORTISOL      Result Value Ref Range   Cortisol, Plasma 1.4     PSA      Result Value Ref Range   PSA 1.06  0.10 - 4.00 ng/mL  HCG, QUANTITATIVE, PREGNANCY      Result Value Ref Range   hCG, Beta Chain, Quant, S 0.58    ACTH      Result Value Ref Range   C206 ACTH <5 (*) 10 - 46 pg/mL   Narrative:    Performed at:  [address], [address]                [place], [place] [address]  INSULIN-LIKE GROWTH FACTOR      Result Value Ref Range   Somatomedin (IGF-I) 93  55 - 213 ng/mL   Narrative:    Performed at:  [facility]                [address]                [place], [place] [address]  ESTRADIOL, FREE      Result Value Ref Range   Estradiol, Free 0.38     Estradiol 17     Results Received 10/25/13     Narrative:    Performed at:  [facility]  [address], [address]                [place], [place] [address]   All labs are normal, except for the cortisol and ACTH levels. As we discussed over the phone, your recent steroid injection and the fact that you were on Prednisone when the labs were drawn are the possible culprits for the low values. Let's repeat the testosterone level in March, as we discussed.  If you have any questions or concerns, please don't hesitate to call us  Sincerely,  Philemon Kingdom, MD

## 2013-10-05 LAB — TESTOSTERONE, FREE, TOTAL, SHBG
Sex Hormone Binding: 31 nmol/L (ref 13–71)
TESTOSTERONE FREE: 39.8 pg/mL — AB (ref 47.0–244.0)
TESTOSTERONE-% FREE: 2 % (ref 1.6–2.9)
TESTOSTERONE: 202 ng/dL — AB (ref 300–890)

## 2013-10-10 ENCOUNTER — Telehealth: Payer: Self-pay | Admitting: Family Medicine

## 2013-10-10 NOTE — Telephone Encounter (Signed)
Relevant patient education mailed to patient.  

## 2013-10-11 ENCOUNTER — Other Ambulatory Visit: Payer: Self-pay | Admitting: Internal Medicine

## 2013-10-13 ENCOUNTER — Other Ambulatory Visit (INDEPENDENT_AMBULATORY_CARE_PROVIDER_SITE_OTHER): Payer: Managed Care, Other (non HMO)

## 2013-10-13 DIAGNOSIS — E291 Testicular hypofunction: Secondary | ICD-10-CM

## 2013-10-13 LAB — CORTISOL: Cortisol, Plasma: 1.4 ug/dL

## 2013-10-13 LAB — IBC PANEL
Iron: 100 ug/dL (ref 42–165)
Saturation Ratios: 31.8 % (ref 20.0–50.0)
Transferrin: 224.3 mg/dL (ref 212.0–360.0)

## 2013-10-13 LAB — HCG, QUANTITATIVE, PREGNANCY: hCG, Beta Chain, Quant, S: 0.58 m[IU]/mL

## 2013-10-13 LAB — PSA: PSA: 1.06 ng/mL (ref 0.10–4.00)

## 2013-10-13 LAB — LUTEINIZING HORMONE: LH: 2.63 m[IU]/mL (ref 1.50–9.30)

## 2013-10-13 LAB — FOLLICLE STIMULATING HORMONE: FSH: 2.5 m[IU]/mL (ref 1.4–18.1)

## 2013-10-14 ENCOUNTER — Telehealth: Payer: Self-pay | Admitting: Family Medicine

## 2013-10-14 NOTE — Telephone Encounter (Signed)
Relevant patient education mailed to patient.  

## 2013-10-15 LAB — INSULIN-LIKE GROWTH FACTOR: Somatomedin (IGF-I): 93 ng/mL (ref 55–213)

## 2013-10-15 LAB — ACTH

## 2013-10-25 LAB — ESTRADIOL, FREE
ESTRADIOL FREE: 0.38 pg/mL
Estradiol: 17 pg/mL

## 2013-10-26 ENCOUNTER — Encounter: Payer: Self-pay | Admitting: Internal Medicine

## 2013-11-08 ENCOUNTER — Other Ambulatory Visit: Payer: Self-pay | Admitting: Internal Medicine

## 2013-12-05 ENCOUNTER — Other Ambulatory Visit: Payer: Self-pay | Admitting: Internal Medicine

## 2013-12-14 ENCOUNTER — Other Ambulatory Visit: Payer: Self-pay | Admitting: Family Medicine

## 2014-01-12 ENCOUNTER — Other Ambulatory Visit: Payer: Self-pay | Admitting: Internal Medicine

## 2014-02-01 ENCOUNTER — Other Ambulatory Visit: Payer: Self-pay | Admitting: Family Medicine

## 2014-02-18 ENCOUNTER — Other Ambulatory Visit: Payer: Self-pay | Admitting: Internal Medicine

## 2014-02-19 ENCOUNTER — Other Ambulatory Visit: Payer: Self-pay | Admitting: Family Medicine

## 2014-03-04 ENCOUNTER — Other Ambulatory Visit: Payer: Self-pay | Admitting: Family Medicine

## 2014-03-22 ENCOUNTER — Other Ambulatory Visit: Payer: Self-pay | Admitting: Family Medicine

## 2014-05-06 ENCOUNTER — Other Ambulatory Visit: Payer: Self-pay | Admitting: Family Medicine

## 2014-05-12 ENCOUNTER — Encounter: Payer: Self-pay | Admitting: Internal Medicine

## 2014-06-26 ENCOUNTER — Other Ambulatory Visit: Payer: Self-pay | Admitting: Family Medicine

## 2014-08-20 ENCOUNTER — Other Ambulatory Visit: Payer: Self-pay | Admitting: Family Medicine

## 2014-09-06 ENCOUNTER — Other Ambulatory Visit: Payer: Self-pay | Admitting: Family Medicine

## 2014-09-20 ENCOUNTER — Other Ambulatory Visit: Payer: Self-pay | Admitting: Family Medicine

## 2014-09-29 ENCOUNTER — Telehealth: Payer: Self-pay | Admitting: Family Medicine

## 2014-09-29 NOTE — Telephone Encounter (Signed)
Pt would like a cpe between  now and and next thurs 1/28.  Pt is off work this week

## 2014-09-30 NOTE — Telephone Encounter (Signed)
Pt has been sch

## 2014-10-04 ENCOUNTER — Other Ambulatory Visit (INDEPENDENT_AMBULATORY_CARE_PROVIDER_SITE_OTHER): Payer: Managed Care, Other (non HMO) | Admitting: *Deleted

## 2014-10-04 ENCOUNTER — Other Ambulatory Visit (INDEPENDENT_AMBULATORY_CARE_PROVIDER_SITE_OTHER): Payer: Managed Care, Other (non HMO)

## 2014-10-04 DIAGNOSIS — Z0000 Encounter for general adult medical examination without abnormal findings: Secondary | ICD-10-CM

## 2014-10-04 LAB — COMPREHENSIVE METABOLIC PANEL
ALBUMIN: 4.4 g/dL (ref 3.5–5.2)
ALK PHOS: 66 U/L (ref 39–117)
ALT: 36 U/L (ref 0–53)
AST: 25 U/L (ref 0–37)
BILIRUBIN TOTAL: 0.6 mg/dL (ref 0.2–1.2)
BUN: 20 mg/dL (ref 6–23)
CALCIUM: 9.5 mg/dL (ref 8.4–10.5)
CHLORIDE: 102 meq/L (ref 96–112)
CO2: 27 mEq/L (ref 19–32)
Creatinine, Ser: 1 mg/dL (ref 0.40–1.50)
GFR: 84.57 mL/min (ref >60.00)
GLUCOSE: 127 mg/dL — AB (ref 70–99)
POTASSIUM: 4.1 meq/L (ref 3.5–5.1)
Sodium: 138 mEq/L (ref 135–145)
TOTAL PROTEIN: 6.7 g/dL (ref 6.0–8.3)

## 2014-10-04 LAB — CBC WITH DIFFERENTIAL/PLATELET
BASOS ABS: 0 10*3/uL (ref 0.0–0.1)
Basophils Relative: 0.4 % (ref 0.0–3.0)
Eosinophils Absolute: 0.2 10*3/uL (ref 0.0–0.7)
Eosinophils Relative: 3.1 % (ref 0.0–5.0)
HEMATOCRIT: 45.9 % (ref 39.0–52.0)
Hemoglobin: 16 g/dL (ref 13.0–17.0)
LYMPHS ABS: 1.7 10*3/uL (ref 0.7–4.0)
LYMPHS PCT: 30.8 % (ref 12.0–46.0)
MCHC: 34.9 g/dL (ref 30.0–36.0)
MCV: 85.1 fl (ref 78.0–100.0)
Monocytes Absolute: 0.4 10*3/uL (ref 0.1–1.0)
Monocytes Relative: 6.6 % (ref 3.0–12.0)
Neutro Abs: 3.2 10*3/uL (ref 1.4–7.7)
Neutrophils Relative %: 59.1 % (ref 43.0–77.0)
Platelets: 203 10*3/uL (ref 150.0–400.0)
RBC: 5.4 Mil/uL (ref 4.22–5.81)
RDW: 12.4 % (ref 11.5–15.5)
WBC: 5.4 10*3/uL (ref 4.0–10.5)

## 2014-10-04 LAB — POCT URINALYSIS DIPSTICK
Bilirubin, UA: NEGATIVE
Glucose, UA: NEGATIVE
KETONES UA: NEGATIVE
LEUKOCYTES UA: NEGATIVE
NITRITE UA: NEGATIVE
PROTEIN UA: NEGATIVE
RBC UA: NEGATIVE
Spec Grav, UA: 1.02
Urobilinogen, UA: 0.2
pH, UA: 7.5

## 2014-10-04 LAB — LIPID PANEL
CHOL/HDL RATIO: 5
CHOLESTEROL: 185 mg/dL (ref 0–200)
HDL: 36.4 mg/dL — ABNORMAL LOW (ref >39.00)
LDL CALC: 109 mg/dL — AB (ref 0–99)
NonHDL: 148.6
TRIGLYCERIDES: 198 mg/dL — AB (ref 0.0–149.0)
VLDL: 39.6 mg/dL (ref 0.0–40.0)

## 2014-10-04 LAB — TSH: TSH: 3.33 u[IU]/mL (ref 0.35–4.50)

## 2014-10-06 ENCOUNTER — Encounter: Payer: Self-pay | Admitting: Family Medicine

## 2014-10-06 ENCOUNTER — Ambulatory Visit (INDEPENDENT_AMBULATORY_CARE_PROVIDER_SITE_OTHER): Payer: Managed Care, Other (non HMO) | Admitting: Family Medicine

## 2014-10-06 VITALS — BP 130/90 | Temp 99.0°F | Ht 74.5 in | Wt 224.0 lb

## 2014-10-06 DIAGNOSIS — I10 Essential (primary) hypertension: Secondary | ICD-10-CM

## 2014-10-06 DIAGNOSIS — E785 Hyperlipidemia, unspecified: Secondary | ICD-10-CM

## 2014-10-06 DIAGNOSIS — F411 Generalized anxiety disorder: Secondary | ICD-10-CM

## 2014-10-06 DIAGNOSIS — K2100 Gastro-esophageal reflux disease with esophagitis, without bleeding: Secondary | ICD-10-CM

## 2014-10-06 DIAGNOSIS — J301 Allergic rhinitis due to pollen: Secondary | ICD-10-CM

## 2014-10-06 DIAGNOSIS — R7301 Impaired fasting glucose: Secondary | ICD-10-CM

## 2014-10-06 DIAGNOSIS — M1712 Unilateral primary osteoarthritis, left knee: Secondary | ICD-10-CM

## 2014-10-06 DIAGNOSIS — H01139 Eczematous dermatitis of unspecified eye, unspecified eyelid: Secondary | ICD-10-CM

## 2014-10-06 DIAGNOSIS — F17229 Nicotine dependence, chewing tobacco, with unspecified nicotine-induced disorders: Secondary | ICD-10-CM

## 2014-10-06 DIAGNOSIS — K219 Gastro-esophageal reflux disease without esophagitis: Secondary | ICD-10-CM

## 2014-10-06 MED ORDER — TRIAMCINOLONE ACETONIDE 0.025 % EX CREA: TOPICAL_CREAM | Freq: Two times a day (BID) | CUTANEOUS | Status: DC

## 2014-10-06 MED ORDER — SIMVASTATIN 10 MG PO TABS: 10.0000 mg | ORAL_TABLET | Freq: Every day | ORAL | Status: DC

## 2014-10-06 MED ORDER — ALBUTEROL SULFATE HFA 108 (90 BASE) MCG/ACT IN AERS
2.0000 | INHALATION_SPRAY | Freq: Four times a day (QID) | RESPIRATORY_TRACT | Status: DC | PRN
Start: 2014-10-06 — End: 2016-12-21

## 2014-10-06 MED ORDER — BUPROPION HCL ER (SR) 100 MG PO TB12: 200.0000 mg | ORAL_TABLET | Freq: Every morning | ORAL | Status: DC

## 2014-10-06 MED ORDER — OMEPRAZOLE 40 MG PO CPDR: DELAYED_RELEASE_CAPSULE | ORAL | Status: DC

## 2014-10-06 MED ORDER — LOSARTAN POTASSIUM 50 MG PO TABS: 50.0000 mg | ORAL_TABLET | Freq: Every day | ORAL | Status: DC

## 2014-10-06 MED ORDER — FLUTICASONE PROPIONATE 50 MCG/ACT NA SUSP: NASAL | Status: DC

## 2014-10-06 MED ORDER — TRAMADOL HCL 50 MG PO TABS: ORAL_TABLET | ORAL | Status: DC

## 2014-10-06 NOTE — Progress Notes (Signed)
Pre visit review using our clinic review tool, if applicable. No additional management support is needed unless otherwise documented below in the visit note. 

## 2014-10-06 NOTE — Patient Instructions (Addendum)
Continue current medications  Restart the Chantix program one half tablet daily and continue a half a tablet daily for 6 weeks after you stop using the smokeless tobacco  Dental evaluation every 6 months  Return in one year sooner if any problems  Sugar free diet  Follow-up fasting blood sugar and A1c in 3 months

## 2014-10-06 NOTE — Progress Notes (Signed)
Subjective:    Patient ID: [name], male    DOB: 05-Sep-1966, 49 y.o.   MRN: [id number]  HPI [name] is a 49 year old male nonsmoker..... Uses smokeless tobacco.... Who comes in today for general physical examination because of a history of anxiety, allergic rhinitis, hypertension, reflux esophagitis, hyperlipidemia, degenerative joint disease left knee, eczema, viral induced asthma  His med list reviewed its is accurate.  He says he feels well and has no complaints. Biggest problem now is his left knee. Had injected the other day by Dr. French Ana. He's due to get fitted with a brace. His next operation is a total knee replacement. 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 We've given him the Chantix in the past. Encouraged him to try that again.   Review of Systems  Constitutional: Negative.   HENT: Negative.   Eyes: Negative.   Respiratory: Negative.   Cardiovascular: Negative.   Gastrointestinal: Negative.   Endocrine: Negative.   Genitourinary: Negative.   Musculoskeletal: Negative.   Skin: Negative.   Allergic/Immunologic: Negative.   Neurological: Negative.   Hematological: Negative.   Psychiatric/Behavioral: Negative.        Objective:   Physical Exam  Constitutional: He is oriented to person, place, and time. He appears well-developed and well-nourished.  HENT:  Head: Normocephalic and atraumatic.  Right Ear: External ear normal.  Left Ear: External ear normal.  Nose: Nose normal.  Mouth/Throat: Oropharynx is clear and moist.  Eyes: Conjunctivae and EOM are normal. Pupils are equal, round, and reactive to light.  Neck: Normal range of motion. Neck supple. No JVD present. No tracheal deviation present. No thyromegaly present.  Cardiovascular: Normal rate, regular rhythm, normal heart sounds and intact distal pulses.  Exam reveals no gallop and no friction rub.   No murmur  heard. Pulmonary/Chest: Effort normal and breath sounds normal. No stridor. No respiratory distress. He has no wheezes. He has no rales. He exhibits no tenderness.  Abdominal: Soft. Bowel sounds are normal. He exhibits no distension and no mass. There is no tenderness. There is no rebound and no guarding.  Genitourinary: Rectum normal, prostate normal and penis normal. Guaiac negative stool. No penile tenderness.  Musculoskeletal: Normal range of motion. He exhibits no edema or tenderness.  Obvious deformity left knee from previous knee surgeries for degenerative cartilage  Lymphadenopathy:    He has no cervical adenopathy.  Neurological: He is alert and oriented to person, place, and time. He has normal reflexes. No cranial nerve deficit. He exhibits normal muscle tone.  Skin: Skin is warm and dry. No rash noted. No erythema. No pallor.  Psychiatric: He has a normal mood and affect. His behavior is normal. Judgment and thought content normal.  Nursing note and vitals reviewed.         Assessment & Plan:  Healthy male  Anxiety continue Wellbutrin 200 mg daily  Allergic rhinitis Flonase  Hypertension ago continue Cozaar 50 mg daily  History of reflux esophagitis with an esophageal stricture. He's been dilated in the past. Currently swallowing normal daily. Continue Prilosec daily  Hyperlipidemia continue Zocor  Degenerative joint disease left knee tramadol elevation and ice when necessary followed by Dr. French Ana  Eczema continue Kenalog when necessary  Uses smokeless tobacco,,,,,,,,,,,, restart the Chantix program to 6 month dental check  Fasting blood sugar 127,,,,,,,, no family history of diabetes,,,,,, his weight is good,,,,,,,, admits to drinking a lot of sodas  during the day.......Marland Kitchen recommend sugar free diet and follow-up fasting blood sugar and A1c in 3 months

## 2014-10-07 ENCOUNTER — Telehealth: Payer: Self-pay | Admitting: Family Medicine

## 2014-10-07 NOTE — Telephone Encounter (Signed)
I received a PA request for omeprazole 40 mg.  PPI's are a plan exclusion and I could not submit a PA.

## 2014-10-08 ENCOUNTER — Telehealth: Payer: Self-pay | Admitting: *Deleted

## 2014-10-08 MED ORDER — PANTOPRAZOLE SODIUM 20 MG PO TBEC: 20.0000 mg | DELAYED_RELEASE_TABLET | Freq: Every day | ORAL | Status: DC

## 2014-10-08 NOTE — Telephone Encounter (Signed)
Left message on machine for patient that insurance will not cover omeprazole

## 2014-10-08 NOTE — Telephone Encounter (Signed)
Omeprazole will no longer be covered by insurance. Patient would like to try Protonix

## 2014-11-02 ENCOUNTER — Other Ambulatory Visit: Payer: Self-pay | Admitting: Family Medicine

## 2015-01-05 ENCOUNTER — Other Ambulatory Visit (INDEPENDENT_AMBULATORY_CARE_PROVIDER_SITE_OTHER): Payer: Managed Care, Other (non HMO)

## 2015-01-05 DIAGNOSIS — R7301 Impaired fasting glucose: Secondary | ICD-10-CM

## 2015-01-05 LAB — BASIC METABOLIC PANEL
BUN: 28 mg/dL — ABNORMAL HIGH (ref 6–23)
CO2: 28 mEq/L (ref 19–32)
Calcium: 9.3 mg/dL (ref 8.4–10.5)
Chloride: 102 mEq/L (ref 96–112)
Creatinine, Ser: 1.46 mg/dL (ref 0.40–1.50)
GFR: 54.59 mL/min — ABNORMAL LOW (ref >60.00)
Glucose, Bld: 96 mg/dL (ref 70–99)
POTASSIUM: 4.4 meq/L (ref 3.5–5.1)
Sodium: 138 mEq/L (ref 135–145)

## 2015-01-05 LAB — HEMOGLOBIN A1C: HEMOGLOBIN A1C: 5.7 % (ref 4.6–6.5)

## 2015-01-12 ENCOUNTER — Other Ambulatory Visit: Payer: Self-pay | Admitting: Family Medicine

## 2015-01-17 ENCOUNTER — Telehealth: Payer: Self-pay | Admitting: Family Medicine

## 2015-01-17 NOTE — Telephone Encounter (Signed)
Patient would like to know his lab results

## 2015-01-21 NOTE — Telephone Encounter (Signed)
Patient is aware that labs were normal per Dr Sherren Mocha

## 2015-01-25 ENCOUNTER — Other Ambulatory Visit: Payer: Self-pay | Admitting: Family Medicine

## 2015-02-10 ENCOUNTER — Other Ambulatory Visit: Payer: Self-pay | Admitting: Family Medicine

## 2015-03-24 NOTE — Telephone Encounter (Signed)
will you close this phone note for me? Thanks ° °

## 2015-04-07 ENCOUNTER — Encounter: Payer: Self-pay | Admitting: *Deleted

## 2015-04-13 ENCOUNTER — Ambulatory Visit: Payer: 59 | Admitting: Anesthesiology

## 2015-04-13 ENCOUNTER — Encounter: Payer: Self-pay | Admitting: *Deleted

## 2015-04-13 ENCOUNTER — Ambulatory Visit
Admission: RE | Admit: 2015-04-13 | Discharge: 2015-04-13 | Disposition: A | Discharge status: Home / Self Care | Payer: 59 | Source: Ambulatory Visit | Attending: Otolaryngology | Admitting: Otolaryngology

## 2015-04-13 ENCOUNTER — Encounter
Admission: RE | Disposition: A | Discharge status: Home / Self Care | Payer: Self-pay | Source: Ambulatory Visit | Attending: Otolaryngology

## 2015-04-13 DIAGNOSIS — J328 Other chronic sinusitis: Secondary | ICD-10-CM | POA: Diagnosis present

## 2015-04-13 DIAGNOSIS — J320 Chronic maxillary sinusitis: Secondary | ICD-10-CM | POA: Diagnosis not present

## 2015-04-13 DIAGNOSIS — I10 Essential (primary) hypertension: Secondary | ICD-10-CM | POA: Insufficient documentation

## 2015-04-13 HISTORY — PX: MAXILLARY ANTROSTOMY: SHX2003

## 2015-04-13 HISTORY — PX: IMAGE GUIDED SINUS SURGERY: SHX6570

## 2015-04-13 HISTORY — DX: Nausea with vomiting, unspecified: R11.2

## 2015-04-13 HISTORY — DX: Other specified postprocedural states: Z98.890

## 2015-04-13 SURGERY — SINUS SURGERY, WITH IMAGING GUIDANCE
Anesthesia: General | Wound class: Clean Contaminated

## 2015-04-13 MED ORDER — FENTANYL CITRATE (PF) 100 MCG/2ML IJ SOLN
INTRAMUSCULAR | Status: DC | PRN
  Administered 2015-04-13: 100 ug via INTRAVENOUS

## 2015-04-13 MED ORDER — SCOPOLAMINE 1 MG/3DAYS TD PT72
1.0000 | MEDICATED_PATCH | TRANSDERMAL | Status: DC
  Administered 2015-04-13: 1.5 mg via TRANSDERMAL

## 2015-04-13 MED ORDER — ACETAMINOPHEN 10 MG/ML IV SOLN
INTRAVENOUS | Status: DC | PRN
  Administered 2015-04-13: 1000 mg via INTRAVENOUS

## 2015-04-13 MED ORDER — DEXAMETHASONE SODIUM PHOSPHATE 4 MG/ML IJ SOLN
INTRAMUSCULAR | Status: DC | PRN
  Administered 2015-04-13: 10 mg via INTRAVENOUS

## 2015-04-13 MED ORDER — MIDAZOLAM HCL 5 MG/5ML IJ SOLN
INTRAMUSCULAR | Status: DC | PRN
Start: 2015-04-13 — End: 2015-04-13
  Administered 2015-04-13: 2 mg via INTRAVENOUS

## 2015-04-13 MED ORDER — OXYCODONE HCL 5 MG/5ML PO SOLN
5.0000 mg | Freq: Once | ORAL | Status: AC | PRN
  Administered 2015-04-13: 5 mg via ORAL

## 2015-04-13 MED ORDER — AMOXICILLIN-POT CLAVULANATE 875-125 MG PO TABS: 1.0000 | ORAL_TABLET | Freq: Two times a day (BID) | ORAL | Status: AC

## 2015-04-13 MED ORDER — ONDANSETRON HCL 4 MG/2ML IJ SOLN: 4.0000 mg | Freq: Once | INTRAMUSCULAR | Status: DC | PRN

## 2015-04-13 MED ORDER — OXYCODONE-ACETAMINOPHEN 5-325 MG PO TABS: 1.0000 | ORAL_TABLET | ORAL | Status: DC | PRN

## 2015-04-13 MED ORDER — EPHEDRINE SULFATE 50 MG/ML IJ SOLN
INTRAMUSCULAR | Status: DC | PRN
  Administered 2015-04-13 (×5): 5 mg via INTRAVENOUS

## 2015-04-13 MED ORDER — HYDROMORPHONE HCL 1 MG/ML IJ SOLN: 0.2500 mg | INTRAMUSCULAR | Status: DC | PRN

## 2015-04-13 MED ORDER — OXYCODONE HCL 5 MG PO TABS: 5.0000 mg | ORAL_TABLET | Freq: Once | ORAL | Status: AC | PRN

## 2015-04-13 MED ORDER — LACTATED RINGERS IV SOLN
INTRAVENOUS | Status: DC
  Administered 2015-04-13: 09:00:00 via INTRAVENOUS

## 2015-04-13 MED ORDER — LIDOCAINE-EPINEPHRINE 1 %-1:100000 IJ SOLN
INTRAMUSCULAR | Status: DC | PRN
  Administered 2015-04-13: 8 mL

## 2015-04-13 MED ORDER — PROPOFOL 10 MG/ML IV BOLUS
INTRAVENOUS | Status: DC | PRN
  Administered 2015-04-13: 200 mg via INTRAVENOUS

## 2015-04-13 MED ORDER — LIDOCAINE HCL 4 % MT SOLN
OROMUCOSAL | Status: DC | PRN
  Administered 2015-04-13: 4 mL via TOPICAL

## 2015-04-13 MED ORDER — OXYMETAZOLINE HCL 0.05 % NA SOLN
NASAL | Status: DC | PRN
  Administered 2015-04-13: 1 via TOPICAL

## 2015-04-13 MED ORDER — SUCCINYLCHOLINE CHLORIDE 20 MG/ML IJ SOLN
INTRAMUSCULAR | Status: DC | PRN
  Administered 2015-04-13: 100 mg via INTRAVENOUS

## 2015-04-13 MED ORDER — ONDANSETRON HCL 4 MG/2ML IJ SOLN
INTRAMUSCULAR | Status: DC | PRN
  Administered 2015-04-13: 4 mg via INTRAVENOUS

## 2015-04-13 MED ORDER — LIDOCAINE HCL (CARDIAC) 20 MG/ML IV SOLN
INTRAVENOUS | Status: DC | PRN
  Administered 2015-04-13: 50 mg via INTRAVENOUS

## 2015-04-13 MED ORDER — GLYCOPYRROLATE 0.2 MG/ML IJ SOLN
INTRAMUSCULAR | Status: DC | PRN
  Administered 2015-04-13: .1 mg via INTRAVENOUS

## 2015-04-13 MED ORDER — ACYCLOVIR 5 % EX OINT: 1.0000 "application " | TOPICAL_OINTMENT | CUTANEOUS | Status: DC

## 2015-04-13 MED ORDER — PROMETHAZINE HCL 12.5 MG PO TABS: 12.5000 mg | ORAL_TABLET | Freq: Four times a day (QID) | ORAL | Status: DC | PRN

## 2015-04-13 SURGICAL SUPPLY — 30 items
BALLOON SINUPLASTY SYSTEM (BALLOONS) IMPLANT
BATTERY INSTRU NAVIGATION (MISCELLANEOUS) ×12 IMPLANT
BLADE IRRIGATOR 40D CVD (IRRIGATION / IRRIGATOR) IMPLANT
CANISTER SUCT 1200ML W/VALVE (MISCELLANEOUS) ×3 IMPLANT
CATH IV 18X1 1/4 SAFELET (CATHETERS) ×3 IMPLANT
COAG SUCT 10F 3.5MM HAND CTRL (MISCELLANEOUS) ×3 IMPLANT
DEVICE INFLATION SEID (MISCELLANEOUS) IMPLANT
DRAPE HEAD BAR (DRAPES) ×3 IMPLANT
DRESSING NASL FOAM PST OP SINU (MISCELLANEOUS) IMPLANT
DRSG NASAL 4CM NASOPORE (MISCELLANEOUS) IMPLANT
DRSG NASAL FOAM POST OP SINU (MISCELLANEOUS)
GLOVE BIO SURGEON STRL SZ7.5 (GLOVE) ×9 IMPLANT
IRRIGATOR 4MM STR (IRRIGATION / IRRIGATOR) ×3 IMPLANT
IV CATH 18X1 1/4 SAFELET (CATHETERS) ×2
IV NS 500ML (IV SOLUTION) ×1
IV NS 500ML BAXH (IV SOLUTION) ×2 IMPLANT
NAVIGATION MASK REG  ST (MISCELLANEOUS) ×3 IMPLANT
NS IRRIG 500ML POUR BTL (IV SOLUTION) ×3 IMPLANT
PACK DRAPE NASAL/ENT (PACKS) ×3 IMPLANT
PACKING NASAL EPIS 4X2.4 XEROG (MISCELLANEOUS) ×6 IMPLANT
PAD GROUND ADULT SPLIT (MISCELLANEOUS) ×3 IMPLANT
PATTIES SURGICAL .5 X3 (DISPOSABLE) ×3 IMPLANT
SET HANDPIECE IRR DIEGO (MISCELLANEOUS) ×3 IMPLANT
SINUPLASTY SPHENOID GUIDE (MISCELLANEOUS) IMPLANT
SOL ANTI-FOG 6CC FOG-OUT (MISCELLANEOUS) ×2 IMPLANT
SOL FOG-OUT ANTI-FOG 6CC (MISCELLANEOUS) ×1
STRAP BODY AND KNEE 60X3 (MISCELLANEOUS) ×3 IMPLANT
SYR BULB EAR ULCER 3OZ GRN STR (SYRINGE) ×3 IMPLANT
SYRINGE 10CC LL (SYRINGE) ×3 IMPLANT
WATER STERILE IRR 500ML POUR (IV SOLUTION) ×3 IMPLANT

## 2015-04-13 NOTE — Anesthesia Procedure Notes (Signed)
Procedure Name: Intubation Performed by: Mayme Genta Pre-anesthesia Checklist: Patient identified, Emergency Drugs available, Suction available, Patient being monitored and Timeout performed Patient Re-evaluated:Patient Re-evaluated prior to inductionOxygen Delivery Method: Circle system utilized Preoxygenation: Pre-oxygenation with 100% oxygen Intubation Type: IV induction Ventilation: Mask ventilation without difficulty Laryngoscope Size: Miller and 3 Grade View: Grade I Tube type: Oral Rae Tube size: 7.5 mm Number of attempts: 1 Placement Confirmation: ETT inserted through vocal cords under direct vision,  positive ETCO2 and breath sounds checked- equal and bilateral Tube secured with: Tape Dental Injury: Teeth and Oropharynx as per pre-operative assessment

## 2015-04-13 NOTE — Discharge Instructions (Signed)
[facility] °[facility] °ENDOSCOPIC SINUS SURGERY °[facility] ° °What is Functional Endoscopic Sinus Surgery? ° The Surgery involves making the natural openings of the sinuses larger by removing the bony partitions that separate the sinuses from the nasal cavity.  The natural sinus lining is preserved as much as possible to allow the sinuses to resume normal function after the surgery.  In some patients nasal polyps (excessively swollen lining of the sinuses) may be removed to relieve obstruction of the sinus openings.  The surgery is performed through the nose using lighted scopes, which eliminates the need for incisions on the face.  A septoplasty is a different procedure which is sometimes performed with sinus surgery.  It involves straightening the boy partition that separates the two sides of your nose.  A crooked or deviated septum may need repair if is obstructing the sinuses or nasal airflow.  Turbinate reduction is also often performed during sinus surgery.  The turbinates are bony proturberances from the side walls of the nose which swell and can obstruct the nose in patients with sinus and allergy problems.  Their size can be surgically reduced to help relieve nasal obstruction. ° °What Can Sinus Surgery Do For Me? ° Sinus surgery can reduce the frequency of sinus infections requiring antibiotic treatment.  This can provide improvement in nasal congestion, post-nasal drainage, facial pressure and nasal obstruction.  Surgery will NOT prevent you from ever having an infection again, so it usually only for patients who get infections 4 or more times yearly requiring antibiotics, or for infections that do not clear with antibiotics.  It will not cure nasal allergies, so patients with allergies may still require medication to treat their allergies after surgery. Surgery may improve headaches related to sinusitis, however, some people will continue to  require medication to control sinus headaches related to allergies.  Surgery will do nothing for other forms of headache (migraine, tension or cluster). °What Are the Risks of Endoscopic Sinus Surgery? ° Current techniques allow surgery to be performed safely with little risk, however, there are rare complications that patients should be aware of.  Because the sinuses are located around the eyes, there is risk of eye injury, including blindness, though again, this would be quite rare. This is usually a result of bleeding behind the eye during surgery, which puts the vision oat risk, though there are treatments to protect the vision and prevent permanent disrupted by surgery causing a leak of the spinal fluid that surrounds the brain.  More serious complications would include bleeding inside the brain cavity or damage to the brain.  Again, all of these complications are uncommon, and spinal fluid leaks can be safely managed surgically if they occur.  The most common complication of sinus surgery is bleeding from the nose, which may require packing or cauterization of the nose.  Continued sinus have polyps may experience recurrence of the polyps requiring revision surgery.  Alterations of sense of smell or injury to the tear ducts are also rare complications.  °What is the Surgery Like, and what is the Recovery? ° The Surgery usually takes a couple of hours to perform, and is usually performed under a general anesthetic (completely asleep).  Patients are usually discharged home after a couple of hours.  Sometimes during surgery it is necessary to pack the nose to control bleeding, and the packing is left in place for 24 - 48 hours, and removed by your surgeon.  If   a septoplasty was performed during the procedure, there is often a splint placed which must be removed after 5-7 days.   °Discomfort: Pain is usually mild to moderate, and can be controlled by prescription pain medication or acetaminophen (Tylenol).   Aspirin, Ibuprofen (Advil, Motrin), or Naprosyn (Aleve) should be avoided, as they can cause increased bleeding.  Most patients feel sinus pressure like they have a bad head cold for several days.  Sleeping with your head elevated can help reduce swelling and facial pressure, as can ice packs over the face.  A humidifier may be helpful to keep the mucous and blood from drying in the nose.  °Diet: There are no specific diet restrictions, however, you should generally start with clear liquids and a light diet of bland foods because the anesthetic can cause some nausea.  Advance your diet depending on how your stomach feels.  Taking your pain medication with food will often help reduce stomach upset which pain medications can cause. ° °Nasal Saline Irrigation: It is important to remove blood clots and dried mucous from the nose as it is healing.  This is done by having you irrigate the nose at least 3 - 4 times daily with a salt water solution.  The salt water solution is made as follows:  1) 2 - 3 heaping teaspoons of canning, pickling or sea salt °  2) 1 teaspoon baking soda, such as Arm [employer] Hammer °  3) 1 quart of warm distilled water °The nose is irrigated using an ear syringe (available at the drug store).  Fill the bulb with the solution, bend over a sink, and insert the syringe into the nose ½ to ¾ of an inch.  Point the tip of the syringe towards the inside corner of the eye on the same side your irrigating.  Squeeze the syringe and gently irrigate the nose.  If you bend forward as you do this, most of the fluid will flow back out of the nose, instead of down your throat.  Make a new solution every 2 - 3 days.  The solution should be ward, near body temperature, when you irrigate. ° °Note that if you are instructed to use Nasal Steroid Sprays at any time after your surgery, irrigate with saline BEFORE using the steroid spray, so you do not wash it all out of the nose. °Another product, Nasal Saline Gel (such as  AYR Nasal Saline Gel) can be applied in each nostril 3 - 4 times daily to moisture the nose and reduce scabbing or crusting. ° °Bleeding:  Bloody drainage from the nose can be expected for several days, and patients are instructed to irrigate their nose frequently with salt water to help remove mucous and blood clots.  The drainage may be dark red or brown, though some fresh blood may be seen intermittently, especially after irrigation.  Do not blow you nose, as bleeding may occur. If you must sneeze, keep your mouth open to allow air to escape through your mouth. °If heavy bleeding occurs: Irrigate the nose with saline to rinse out clots, then spray the nose 3 - 4 times with Afrin Nasal Decongestant Spray.  The spray will constrict the blood vessels to slow bleeding.  Pinch the lower half of your nose shut to apply pressure, and lay down with your head elevated.  Ice packs over the nose may help as well. If bleeding persists despite these measures, you should notify your doctor.  Do not use the Afrin routinely   to control nasal congestion after surgery, as it can result in worsening congestion and may affect healing.   Activity: Return to work varies among patients. Most patients will be out of work at least 5 - 7 days to recover.  Patient may return to work after they are off of narcotic pain medication, and feeling well enough to perform the functions of their job.  Patients must avoid heavy lifting (over 10 pounds) or strenuous physical for 2 weeks after surgery, so your employer may need to assign you to light duty, or keep you out of work longer if light duty is not possible.  NOTE: you should not drive, operate dangerous machinery, do any mentally demanding tasks or make any important legal or financial decisions while on narcotic pain medication and recovering from the general anesthetic.    Call Your Doctor Immediately if You Have Any of the Following: 1. Bleeding that you cannot control with the above  measures 2. Loss of vision, double vision, bulging of the eye or black eyes. 3. Fever over 101 degrees 4. Neck stiffness with severe headache, fever, nausea and change in mental state. You are always encourage to call anytime with concerns, however, please call with requests for pain medication refills during office hours.  Office Endoscopy: During follow-up visits your doctor will remove any packing or splints that may have been placed and evaluate and clean your sinuses endoscopically.  Topical anesthetic will be used to make this as comfortable as possible, though you may want to take your pain medication prior to the visit.  How often this will need to be done varies from patient to patient.  After complete recovery from the surgery, you may need follow-up endoscopy from time to time, particularly if there is concern of recurrent infection or nasal polyps.   General Anesthesia, Care After Refer to this sheet in the next few weeks. These instructions provide you with information on caring for yourself after your procedure. Your health care provider may also give you more specific instructions. Your treatment has been planned according to current medical practices, but problems sometimes occur. Call your health care provider if you have any problems or questions after your procedure. WHAT TO EXPECT AFTER THE PROCEDURE After the procedure, it is typical to experience:  Sleepiness.  Nausea and vomiting. HOME CARE INSTRUCTIONS  For the first 24 hours after general anesthesia:  Have a responsible person with you.  Do not drive a car. If you are alone, do not take public transportation.  Do not drink alcohol.  Do not take medicine that has not been prescribed by your health care provider.  Do not sign important papers or make important decisions.  You may resume a normal diet and activities as directed by your health care provider.  Change bandages (dressings) as directed.  If you  have questions or problems that seem related to general anesthesia, call the hospital and ask for the anesthetist or anesthesiologist on call. SEEK MEDICAL CARE IF:  You have nausea and vomiting that continue the day after anesthesia.  You develop a rash. SEEK IMMEDIATE MEDICAL CARE IF:   You have difficulty breathing.  You have chest pain.  You have any allergic problems. Document Released: 12/03/2000 Document Revised: 09/01/2013 Document Reviewed: 03/12/2013 [facility] Patient Information 2015 [place], [place]. This information is not intended to replace advice given to you by your health care provider. Make sure you discuss any questions you have with your health care providerINSTRUCTIONS

## 2015-04-13 NOTE — Op Note (Signed)
..  04/13/2015  11:03 AM    [name]  [id number]   Pre-Op Dx:  Chronic Rhinosinusitis refractory to medical treatment  Post-op Dx: Same  Proc:   1)  Image Guided Sinus Surgery,  2)  Bilateral Maxillary Antrostomy   Surg:  Taiana Temkin  Anes:  General  EBL:  21ml  Comp:  None  Findings: Bilateral accessory ostia.  Large maxillary antrostomy bilaterally  Procedure: After the patient was identified in holding and the benefits of the procedure were reviewed as well as the consent and risks.  The patient was taken to the operating room and with the patient in a comfortable supine position,  general orotracheal anesthesia was induced without difficulty.  A proper time-out was performed.  The Stryker image guidance system was set up and calibrated in the normal fashion with an acceptable error of 0.48mm.       The patient next received preoperative Afrin spray for topical decongestion and vasoconstriction and 1% Xylocaine with 1:100,000 epinephrine, 8 cc's, was infiltrated into the inferior turbinates, septum, and anterior middle turbinates bilaterally.  Several minutes were allowed for this to take effect.  Cottoniod pledgets soaked in Afrin were placed into both nasal cavities and left while the patient was prepped and draped in the standard fashion.  The materials were removed from the nose and observed to be intact and correct in number.  The nose was next inspected with a zero degree endoscope and the middle turbinates were medialized and afrin soaked pledgets were placed lateral to the turbinates for approximately one minute.  At this time attention was directed to the patient's maxillary sinuses.  Large accessory ostia were noted bilaterally.  On the left, a ball tipped probe was used to infracture the uncinate process.  This was removed with a diego microdebrider as well as an Ethmoid forceps.  Next, the ball tipped probe through use of a 30 degree scope was placed through the  natural ostia and this was used to create a larger opening and connect with the accessory ostia.  Using a Diego microdebrider, the maxillary antrostomy was enlarged for a widely patent maxillary antrostomy.  This was repeated in a simlar fashion on the patient's right side.  Hemostasis was performed with topical Afrin soaked pledgets.  Visualization with a zero degree and 30 degree endoscope was used to examine the bilateral maxillary antrostomies which were noted to be widely patent and in continuity with the natural os bilaterally.  At this time with both maxillary sinuses opened, the patient's nasal cavity was examinated and copiously irrigated with sterile saline.  Meticulous hemostasis was continued and all sinuses were examined and noted to be widely patent.    Next Stamberger sinufoam and Xerogel stents were placed lateral to the middle turbinates bilaterally under endoscopic visualization.  Proper positioning of the Xerogel along the anterior border of the middle turbinate was ensured.  Proper medialization of the bilateral middle turbinates was also ensured.    Care of the patient at this time was transferred to anesthesia and was extubated and taken to PACU in good condition.   Dispo:   PACU to home  Plan: Ice, elevation, narcotic analgesia and prophylactic antibiotics.  We will reevaluate the patient in the office in 7 days.  Return to work in 7-10 days, strenuous activities in two weeks.   Josephyne Tarter 04/13/2015 11:03 AM

## 2015-04-13 NOTE — Transfer of Care (Signed)
Immediate Anesthesia Transfer of Care Note  Patient: [name]  Procedure(s) Performed: Procedure(s) with comments: IMAGE GUIDED SINUS SURGERY (N/A) - GAVE DISK TO CE CE MAXILLARY ANTROSTOMY (Bilateral)  Patient Location: PACU  Anesthesia Type: General  Level of Consciousness: awake, alert  and patient cooperative  Airway and Oxygen Therapy: Patient Spontanous Breathing and Patient connected to supplemental oxygen  Post-op Assessment: Post-op Vital signs reviewed, Patient's Cardiovascular Status Stable, Respiratory Function Stable, Patent Airway and No signs of Nausea or vomiting  Post-op Vital Signs: Reviewed and stable  Complications: No apparent anesthesia complications

## 2015-04-13 NOTE — Anesthesia Postprocedure Evaluation (Signed)
  Anesthesia Post-op Note  Patient: [name]  Procedure(s) Performed: Procedure(s) with comments: IMAGE GUIDED SINUS SURGERY (N/A) - GAVE DISK TO CE CE MAXILLARY ANTROSTOMY (Bilateral)  Anesthesia type:General  Patient location: PACU  Post pain: Pain level controlled  Post assessment: Post-op Vital signs reviewed, Patient's Cardiovascular Status Stable, Respiratory Function Stable, Patent Airway and No signs of Nausea or vomiting  Post vital signs: Reviewed and stable  Last Vitals:  Filed Vitals:   04/13/15 1113  BP:   Pulse: 85  Temp: 36.1 C  Resp: 11    Level of consciousness: awake, alert  and patient cooperative  Complications: No apparent anesthesia complications

## 2015-04-13 NOTE — Anesthesia Preprocedure Evaluation (Signed)
Anesthesia Evaluation  Patient identified by MRN, date of birth, ID band  Reviewed: Allergy & Precautions, NPO status , Patient's Chart, lab work & pertinent test results  History of Anesthesia Complications (+) PONV and history of anesthetic complications  Airway Mallampati: II  TM Distance: >3 FB Neck ROM: Full    Dental   Pulmonary asthma ,    Pulmonary exam normal       Cardiovascular hypertension, Normal cardiovascular exam    Neuro/Psych Anxiety    GI/Hepatic GERD-  ,  Endo/Other    Renal/GU      Musculoskeletal  (+) Arthritis -,   Abdominal   Peds  Hematology   Anesthesia Other Findings   Reproductive/Obstetrics                             Anesthesia Physical Anesthesia Plan  ASA: II  Anesthesia Plan: General   Post-op Pain Management:    Induction: Intravenous  Airway Management Planned: Oral ETT  Additional Equipment:   Intra-op Plan:   Post-operative Plan: Extubation in OR  Informed Consent: I have reviewed the patients History and Physical, chart, labs and discussed the procedure including the risks, benefits and alternatives for the proposed anesthesia with the patient or authorized representative who has indicated his/her understanding and acceptance.     Plan Discussed with: CRNA  Anesthesia Plan Comments:         Anesthesia Quick Evaluation

## 2015-04-13 NOTE — H&P (Signed)
..  History and Physical paper copy reviewed and updated date of procedure and will be scanned into system.  

## 2015-04-14 ENCOUNTER — Encounter: Payer: Self-pay | Admitting: Otolaryngology

## 2015-04-15 LAB — SURGICAL PATHOLOGY

## 2015-04-26 ENCOUNTER — Other Ambulatory Visit: Payer: Self-pay | Admitting: Family Medicine

## 2015-07-11 ENCOUNTER — Other Ambulatory Visit: Payer: Self-pay | Admitting: Family Medicine

## 2015-09-30 ENCOUNTER — Other Ambulatory Visit: Payer: Self-pay | Admitting: Family Medicine

## 2015-10-03 ENCOUNTER — Other Ambulatory Visit: Payer: Self-pay | Admitting: Family Medicine

## 2015-10-12 ENCOUNTER — Encounter: Payer: Self-pay | Admitting: Gastroenterology

## 2015-10-22 ENCOUNTER — Other Ambulatory Visit: Payer: Self-pay | Admitting: Family Medicine

## 2015-11-12 ENCOUNTER — Other Ambulatory Visit: Payer: Self-pay | Admitting: Family Medicine

## 2015-11-17 ENCOUNTER — Other Ambulatory Visit: Payer: Self-pay | Admitting: Family Medicine

## 2015-11-24 ENCOUNTER — Encounter: Payer: Self-pay | Admitting: Gastroenterology

## 2015-11-24 ENCOUNTER — Ambulatory Visit (INDEPENDENT_AMBULATORY_CARE_PROVIDER_SITE_OTHER): Payer: 59 | Admitting: Gastroenterology

## 2015-11-24 VITALS — BP 124/88 | HR 76 | Ht 74.5 in | Wt 210.8 lb

## 2015-11-24 DIAGNOSIS — G8929 Other chronic pain: Secondary | ICD-10-CM

## 2015-11-24 DIAGNOSIS — K219 Gastro-esophageal reflux disease without esophagitis: Secondary | ICD-10-CM | POA: Diagnosis not present

## 2015-11-24 DIAGNOSIS — R1013 Epigastric pain: Secondary | ICD-10-CM

## 2015-11-24 DIAGNOSIS — R140 Abdominal distension (gaseous): Secondary | ICD-10-CM | POA: Diagnosis not present

## 2015-11-24 MED ORDER — SUCRALFATE 1 GM/10ML PO SUSP: 1.0000 g | Freq: Four times a day (QID) | ORAL | Status: DC

## 2015-11-24 NOTE — Progress Notes (Signed)
[facility] Consult Note:  History: [name] 11/24/2015  Referring physician: Joycelyn Man, MD  Reason for consult/chief complaint: Gastroesophageal Reflux; Abdominal Pain; and rectal discomfort   Subjective HPI:  08/26/12, [name] wrote "This is a 50 year old white male with epigastric burning. His last appointment was in October 2013. An upper endoscopy showed H. pylori negative chronic active gastritis. His upper abdominal ultrasound in September 2013 showed a 14 cm spleen, 4 mm common bile duct and normal-appearing liver. His liver function tests are normal. He has stopped using excess alcohol. There is a history of pancreatitis but he has not been actively drinking. He was switched from Prilosec to Dexilant 60 mg daily. He is much improved although the pain still appears several times a week. The pain is relieved by food. 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 He is on Carafate 1 g twice a day" Was switched to prilosec and bentyl. He cannot recall if he took Carafate or if it helped.  An upper endoscopy in November 2013 was normal except for mild NSAID gastropathy. The patient now reports about a month of recurrence of his previous symptoms, with some morning burning in the epigastrium as well as pyrosis number frequent belching and gas. He is still using meloxicam regularly for work-related arthritic pain. He has occasional constipation and denies rectal bleeding. He denies early satiety nausea vomiting or weight loss.  ROS:  Review of Systems  Constitutional: Negative for appetite change and unexpected weight change.  HENT: Negative for mouth sores and voice change.   Eyes: Negative for pain and redness.  Respiratory: Negative for cough and shortness of breath.   Cardiovascular: Negative for chest pain and palpitations.  Genitourinary: Negative for dysuria and hematuria.  Musculoskeletal: Positive for arthralgias. Negative  for myalgias.  Skin: Negative for pallor and rash.  Neurological: Negative for weakness and headaches.  Hematological: Negative for adenopathy.     Past Medical History: Past Medical History  Diagnosis Date  . HYPERLIPIDEMIA   . ANXIETY   . ERECTILE DYSFUNCTION   . HYPERTENSION   . Other acute sinusitis   . ALLERGIC RHINITIS   . ASTHMA   . GERD   . SHOULDER PAIN, LEFT   . Pain in joint, hand   . NECK PAIN   . BACK PAIN   . TENDINITIS, PATELLAR   . LUMBAR STRAIN   . HYPERTENSION NEC   . PANCREATITIS, HX OF   . Substance abuse     quit in 2007  . PONV (postoperative nausea and vomiting)     nausea only   . Motion sickness     Boats     Past Surgical History: Past Surgical History  Procedure Laterality Date  . Knee arthroscopy Right 2008  . Knee surgery Left     x2  . Image guided sinus surgery N/A 04/13/2015    Procedure: IMAGE GUIDED SINUS SURGERY;  Surgeon: Carloyn Manner, MD;  Location: [place];  Service: ENT;  Laterality: N/A;  GAVE DISK TO CE CE  . Maxillary antrostomy Bilateral 04/13/2015    Procedure: MAXILLARY ANTROSTOMY;  Surgeon: Carloyn Manner, MD;  Location: [place];  Service: ENT;  Laterality: Bilateral;     Family History: Family History  Problem Relation Age of Onset  . Lung disease Father   . Melanoma Mother   . Colon polyps Neg Hx   . Colon cancer Neg Hx   . Stomach cancer Neg Hx   .  Pancreatic cancer Neg Hx   . Esophageal cancer Neg Hx     Social History: Social History   Social History  . Marital Status: Married    Spouse Name: N/A  . Number of Children: N/A  . Years of Education: N/A   Social History Main Topics  . Smoking status: Never Smoker   . Smokeless tobacco: Current User    Types: Chew     Comment: form given 11/24/15  . Alcohol Use: No     Comment: hx alcohol abuse  . Drug Use: No  . Sexual Activity: Not Asked   Other Topics Concern  . None   Social History Narrative   He uses chewing  tobacco He reports that he is 10 years without alcohol Allergies: Allergies  Allergen Reactions  . Aspirin Nausea And Vomiting    Outpatient Meds: Current Outpatient Prescriptions  Medication Sig Dispense Refill  . albuterol (VENTOLIN HFA) 108 (90 BASE) MCG/ACT inhaler Inhale 2 puffs into the lungs every 6 (six) hours as needed for wheezing. Every 4 hours 18 Inhaler 2  . buPROPion (WELLBUTRIN SR) 100 MG 12 hr tablet TAKE 2 TABLETS (200 MG TOTAL) BY MOUTH EVERY MORNING. (Patient taking differently: Take 1 tablet by mouth twice daily) 200 tablet 1  . cetirizine (ZYRTEC) 10 MG tablet Take 10 mg by mouth daily as needed for allergies. AM    . Diclofenac Sodium (PENNSAID) 2 % SOLN Place 2 puffs onto the skin 2 (two) times daily as needed.    . fluticasone (FLONASE) 50 MCG/ACT nasal spray USE 1 SPRAY IN EACH NOSTRIL ONCE DAILY *PT NEEDS OFFICE VISIT* 16 g 3  . losartan (COZAAR) 50 MG tablet TAKE 1 TABLET (50 MG TOTAL) BY MOUTH DAILY. 90 tablet 3  . meloxicam (MOBIC) 15 MG tablet Take 15 mg by mouth daily as needed for pain.    . pantoprazole (PROTONIX) 20 MG tablet TAKE 1 TABLET BY MOUTH EVERY DAY 90 tablet 1  . simvastatin (ZOCOR) 10 MG tablet TAKE 1 TABLET BY MOUTH AT BEDTIME 100 tablet 2  . traMADol (ULTRAM) 50 MG tablet TAKE 1/2 TO 1 TABLET BY MOUTH 3 TIMES DAILY AS NEEDED 90 tablet 5  . sucralfate (CARAFATE) 1 GM/10ML suspension Take 10 mLs (1 g total) by mouth 4 (four) times daily. As needed for heartburn 420 mL 1   No current facility-administered medications for this visit.      ___________________________________________________________________ Objective  Exam:  BP 124/88 mmHg  Pulse 76  Ht 6' 2.5" (1.892 m)  Wt 210 lb 12.8 oz (95.618 kg)  BMI 26.71 kg/m2   General: this is a well-appearing middle-aged man with good muscle mass   Eyes: sclera anicteric, no redness  ENT: oral mucosa moist without lesions, no cervical or supraclavicular lymphadenopathy, good  dentition  CV: RRR without murmur, S1/S2, no JVD, no peripheral edema  Resp: clear to auscultation bilaterally, normal RR and effort noted  GI: soft, no tenderness, with active bowel sounds. No guarding or palpable organomegaly noted.  Skin; warm and dry, no rash or jaundice noted  Neuro: awake, alert and oriented x 3. Normal gross motor function and fluent speech  Assessment: Encounter Diagnoses  Name Primary?  . Gastroesophageal reflux disease without esophagitis Yes  . Abdominal pain, chronic, epigastric   . Bloating     I suspect NSAIDs are causing gastropathy and exacerbation of GERD. PPIdoes not seem to be helping much.  Plan:   trial of Carafate 10 mL 2-3 times  a day I don't think further tests are necessary at this point. Follow-up as needed, especially if he develops red flag symptoms such as dysphagia, vomiting or weight loss.  Thank you for the courtesy of this consult.  Please call me with any questions or concerns.  Nelida Meuse III

## 2015-11-24 NOTE — Patient Instructions (Addendum)
You have been given a separate informational sheet regarding your tobacco use, the importance of quitting and local resources to help you quit. We have sent a Carafate script to [facility] [address]

## 2015-12-12 ENCOUNTER — Other Ambulatory Visit: Payer: Self-pay | Admitting: Family Medicine

## 2016-01-08 ENCOUNTER — Other Ambulatory Visit: Payer: Self-pay | Admitting: Family Medicine

## 2016-01-12 ENCOUNTER — Ambulatory Visit (INDEPENDENT_AMBULATORY_CARE_PROVIDER_SITE_OTHER): Payer: 59 | Admitting: Adult Health

## 2016-01-12 ENCOUNTER — Encounter: Payer: Self-pay | Admitting: Adult Health

## 2016-01-12 VITALS — BP 132/88 | Temp 98.1°F | Wt 212.1 lb

## 2016-01-12 DIAGNOSIS — I10 Essential (primary) hypertension: Secondary | ICD-10-CM | POA: Diagnosis not present

## 2016-01-12 DIAGNOSIS — Z1211 Encounter for screening for malignant neoplasm of colon: Secondary | ICD-10-CM

## 2016-01-12 DIAGNOSIS — Z23 Encounter for immunization: Secondary | ICD-10-CM

## 2016-01-12 DIAGNOSIS — Z0000 Encounter for general adult medical examination without abnormal findings: Secondary | ICD-10-CM | POA: Diagnosis not present

## 2016-01-12 LAB — LIPID PANEL
CHOL/HDL RATIO: 4
Cholesterol: 163 mg/dL (ref 0–200)
HDL: 41.7 mg/dL (ref >39.00)
LDL CALC: 107 mg/dL — AB (ref 0–99)
NONHDL: 120.86
Triglycerides: 67 mg/dL (ref 0.0–149.0)
VLDL: 13.4 mg/dL (ref 0.0–40.0)

## 2016-01-12 LAB — POC URINALSYSI DIPSTICK (AUTOMATED)
Bilirubin, UA: NEGATIVE
Blood, UA: NEGATIVE
GLUCOSE UA: NEGATIVE
Ketones, UA: NEGATIVE
LEUKOCYTES UA: NEGATIVE
NITRITE UA: NEGATIVE
PH UA: 6
PROTEIN UA: NEGATIVE
Spec Grav, UA: >=1.03
Urobilinogen, UA: 1

## 2016-01-12 LAB — HEPATIC FUNCTION PANEL
ALK PHOS: 51 U/L (ref 39–117)
ALT: 34 U/L (ref 0–53)
AST: 22 U/L (ref 0–37)
Albumin: 4.4 g/dL (ref 3.5–5.2)
BILIRUBIN TOTAL: 0.6 mg/dL (ref 0.2–1.2)
Bilirubin, Direct: 0.1 mg/dL (ref 0.0–0.3)
Total Protein: 6.2 g/dL (ref 6.0–8.3)

## 2016-01-12 LAB — BASIC METABOLIC PANEL
BUN: 19 mg/dL (ref 6–23)
CHLORIDE: 105 meq/L (ref 96–112)
CO2: 28 mEq/L (ref 19–32)
Calcium: 9.2 mg/dL (ref 8.4–10.5)
Creatinine, Ser: 0.9 mg/dL (ref 0.40–1.50)
GFR: 95.01 mL/min (ref >60.00)
Glucose, Bld: 104 mg/dL — ABNORMAL HIGH (ref 70–99)
Potassium: 4.5 mEq/L (ref 3.5–5.1)
Sodium: 140 mEq/L (ref 135–145)

## 2016-01-12 LAB — CBC WITH DIFFERENTIAL/PLATELET
Basophils Absolute: 0 10*3/uL (ref 0.0–0.1)
Basophils Relative: 0.7 % (ref 0.0–3.0)
EOS ABS: 0.1 10*3/uL (ref 0.0–0.7)
EOS PCT: 2.4 % (ref 0.0–5.0)
HEMATOCRIT: 42.8 % (ref 39.0–52.0)
HEMOGLOBIN: 15 g/dL (ref 13.0–17.0)
LYMPHS PCT: 29.9 % (ref 12.0–46.0)
Lymphs Abs: 1.2 10*3/uL (ref 0.7–4.0)
MCHC: 35 g/dL (ref 30.0–36.0)
MCV: 86.9 fl (ref 78.0–100.0)
Monocytes Absolute: 0.3 10*3/uL (ref 0.1–1.0)
Monocytes Relative: 6.9 % (ref 3.0–12.0)
Neutro Abs: 2.4 10*3/uL (ref 1.4–7.7)
Neutrophils Relative %: 60.1 % (ref 43.0–77.0)
Platelets: 149 10*3/uL — ABNORMAL LOW (ref 150.0–400.0)
RBC: 4.93 Mil/uL (ref 4.22–5.81)
RDW: 12.3 % (ref 11.5–15.5)
WBC: 3.9 10*3/uL — AB (ref 4.0–10.5)

## 2016-01-12 LAB — TSH: TSH: 2.43 u[IU]/mL (ref 0.35–4.50)

## 2016-01-12 LAB — PSA: PSA: 1.15 ng/mL (ref 0.10–4.00)

## 2016-01-12 LAB — HEMOGLOBIN A1C: Hgb A1c MFr Bld: 5.6 % (ref 4.6–6.5)

## 2016-01-12 NOTE — Patient Instructions (Addendum)
It was great seeing you today   I will follow up with you regarding your blood work   Continue to work on diet and try getting some exercise outside of work   Follow up in one year or sooner if needed  Health Maintenance, Male A healthy lifestyle and preventative care can promote health and wellness.  Maintain regular health, dental, and eye exams.  Eat a healthy diet. Foods like vegetables, fruits, whole grains, low-fat dairy products, and lean protein foods contain the nutrients you need and are low in calories. Decrease your intake of foods high in solid fats, added sugars, and salt. Get information about a proper diet from your health care provider, if necessary.  Regular physical exercise is one of the most important things you can do for your health. Most adults should get at least 150 minutes of moderate-intensity exercise (any activity that increases your heart rate and causes you to sweat) each week. In addition, most adults need muscle-strengthening exercises on 2 or more days a week.   Maintain a healthy weight. The body mass index (BMI) is a screening tool to identify possible weight problems. It provides an estimate of body fat based on height and weight. Your health care provider can find your BMI and can help you achieve or maintain a healthy weight. For males 20 years and older:  A BMI below 18.5 is considered underweight.  A BMI of 18.5 to 24.9 is normal.  A BMI of 25 to 29.9 is considered overweight.  A BMI of 30 and above is considered obese.  Maintain normal blood lipids and cholesterol by exercising and minimizing your intake of saturated fat. Eat a balanced diet with plenty of fruits and vegetables. Blood tests for lipids and cholesterol should begin at age 38 and be repeated every 5 years. If your lipid or cholesterol levels are high, you are over age 32, or you are at high risk for heart disease, you may need your cholesterol levels checked more frequently.Ongoing  high lipid and cholesterol levels should be treated with medicines if diet and exercise are not working.  If you smoke, find out from your health care provider how to quit. If you do not use tobacco, do not start.  Lung cancer screening is recommended for adults aged 70-80 years who are at high risk for developing lung cancer because of a history of smoking. A yearly low-dose CT scan of the lungs is recommended for people who have at least a 30-pack-year history of smoking and are current smokers or have quit within the past 15 years. A pack year of smoking is smoking an average of 1 pack of cigarettes a day for 1 year (for example, a 30-pack-year history of smoking could mean smoking 1 pack a day for 30 years or 2 packs a day for 15 years). Yearly screening should continue until the smoker has stopped smoking for at least 15 years. Yearly screening should be stopped for people who develop a health problem that would prevent them from having lung cancer treatment.  If you choose to drink alcohol, do not have more than 2 drinks per day. One drink is considered to be 12 oz (360 mL) of beer, 5 oz (150 mL) of wine, or 1.5 oz (45 mL) of liquor.  Avoid the use of street drugs. Do not share needles with anyone. Ask for help if you need support or instructions about stopping the use of drugs.  High blood pressure causes heart disease  and increases the risk of stroke. High blood pressure is more likely to develop in:  People who have blood pressure in the end of the normal range (100-139/85-89 mm Hg).  People who are overweight or obese.  People who are African American.  If you are 16-2 years of age, have your blood pressure checked every 3-5 years. If you are 57 years of age or older, have your blood pressure checked every year. You should have your blood pressure measured twice--once when you are at a hospital or clinic, and once when you are not at a hospital or clinic. Record the average of the two  measurements. To check your blood pressure when you are not at a hospital or clinic, you can use:  An automated blood pressure machine at a pharmacy.  A home blood pressure monitor.  If you are 5-57 years old, ask your health care provider if you should take aspirin to prevent heart disease.  Diabetes screening involves taking a blood sample to check your fasting blood sugar level. This should be done once every 3 years after age 7 if you are at a normal weight and without risk factors for diabetes. Testing should be considered at a younger age or be carried out more frequently if you are overweight and have at least 1 risk factor for diabetes.  Colorectal cancer can be detected and often prevented. Most routine colorectal cancer screening begins at the age of 1 and continues through age 3. However, your health care provider may recommend screening at an earlier age if you have risk factors for colon cancer. On a yearly basis, your health care provider may provide home test kits to check for hidden blood in the stool. A small camera at the end of a tube may be used to directly examine the colon (sigmoidoscopy or colonoscopy) to detect the earliest forms of colorectal cancer. Talk to your health care provider about this at age 69 when routine screening begins. A direct exam of the colon should be repeated every 5-10 years through age 20, unless early forms of precancerous polyps or small growths are found.  People who are at an increased risk for hepatitis B should be screened for this virus. You are considered at high risk for hepatitis B if:  You were born in a country where hepatitis B occurs often. Talk with your health care provider about which countries are considered high risk.  Your parents were born in a high-risk country and you have not received a shot to protect against hepatitis B (hepatitis B vaccine).  You have HIV or AIDS.  You use needles to inject street drugs.  You live  with, or have sex with, someone who has hepatitis B.  You are a man who has sex with other men (MSM).  You get hemodialysis treatment.  You take certain medicines for conditions like cancer, organ transplantation, and autoimmune conditions.  Hepatitis C blood testing is recommended for all people born from [birth year] through 1965 and any individual with known risk factors for hepatitis C.  Healthy men should no longer receive prostate-specific antigen (PSA) blood tests as part of routine cancer screening. Talk to your health care provider about prostate cancer screening.  Testicular cancer screening is not recommended for adolescents or adult males who have no symptoms. Screening includes self-exam, a health care provider exam, and other screening tests. Consult with your health care provider about any symptoms you have or any concerns you have about testicular cancer.  Practice safe sex. Use condoms and avoid high-risk sexual practices to reduce the spread of sexually transmitted infections (STIs).  You should be screened for STIs, including gonorrhea and chlamydia if:  You are sexually active and are younger than 24 years.  You are older than 24 years, and your health care provider tells you that you are at risk for this type of infection.  Your sexual activity has changed since you were last screened, and you are at an increased risk for chlamydia or gonorrhea. Ask your health care provider if you are at risk.  If you are at risk of being infected with HIV, it is recommended that you take a prescription medicine daily to prevent HIV infection. This is called pre-exposure prophylaxis (PrEP). You are considered at risk if:  You are a man who has sex with other men (MSM).  You are a heterosexual man who is sexually active with multiple partners.  You take drugs by injection.  You are sexually active with a partner who has HIV.  Talk with your health care provider about whether you are at  high risk of being infected with HIV. If you choose to begin PrEP, you should first be tested for HIV. You should then be tested every 3 months for as long as you are taking PrEP.  Use sunscreen. Apply sunscreen liberally and repeatedly throughout the day. You should seek shade when your shadow is shorter than you. Protect yourself by wearing long sleeves, pants, a wide-brimmed hat, and sunglasses year round whenever you are outdoors.  Tell your health care provider of new moles or changes in moles, especially if there is a change in shape or color. Also, tell your health care provider if a mole is larger than the size of a pencil eraser.  A one-time screening for abdominal aortic aneurysm (AAA) and surgical repair of large AAAs by ultrasound is recommended for men aged 8-75 years who are current or former smokers.  Stay current with your vaccines (immunizations).   This information is not intended to replace advice given to you by your health care provider. Make sure you discuss any questions you have with your health care provider.   Document Released: 02/23/2008 Document Revised: 09/17/2014 Document Reviewed: 01/22/2011 Elsevier Interactive Patient Education [employer].

## 2016-01-12 NOTE — Progress Notes (Signed)
Patient presents to clinic today to establish care. He is a pleasant caucasian male who  has a past medical history of HYPERLIPIDEMIA; ANXIETY; ERECTILE DYSFUNCTION; HYPERTENSION; Other acute sinusitis; ALLERGIC RHINITIS; ASTHMA; GERD; SHOULDER PAIN, LEFT; Pain in joint, hand; NECK PAIN; BACK PAIN; TENDINITIS, PATELLAR; LUMBAR STRAIN; PANCREATITIS, HX OF; Substance abuse; PONV (postoperative nausea and vomiting); and Motion sickness.  His last physical was in January 2016.    Acute Concerns: Complete Physical   Chronic Issues: Hypertension - feels as though it is well controlled.   Depression  - Is taking Wellbutrin, feels as though this is under control   Health Maintenance: Dental -- Does not see [facility] -- Has not been in awhile Immunizations --Needs Tdap  Colonoscopy -- Has not had one yet  Diet: Tries to eat healthy. Likes to eat fish.  Exercise: He is active as a Games developer. Does not exercise outside of work    Past Medical History  Diagnosis Date  . HYPERLIPIDEMIA   . ANXIETY   . ERECTILE DYSFUNCTION   . HYPERTENSION   . Other acute sinusitis   . ALLERGIC RHINITIS   . ASTHMA   . GERD   . SHOULDER PAIN, LEFT   . Pain in joint, hand   . NECK PAIN   . BACK PAIN   . TENDINITIS, PATELLAR   . LUMBAR STRAIN   . HYPERTENSION NEC   . PANCREATITIS, HX OF   . Substance abuse     quit in 2007  . PONV (postoperative nausea and vomiting)     nausea only   . Motion sickness     Boats    Past Surgical History  Procedure Laterality Date  . Knee arthroscopy Right 2008  . Knee surgery Left     x2  . Image guided sinus surgery N/A 04/13/2015    Procedure: IMAGE GUIDED SINUS SURGERY;  Surgeon: Carloyn Manner, MD;  Location: [facility];  Service: ENT;  Laterality: N/A;  GAVE DISK TO CE CE  . Maxillary antrostomy Bilateral 04/13/2015    Procedure: MAXILLARY ANTROSTOMY;  Surgeon: Carloyn Manner, MD;  Location: [facility];  Service: ENT;  Laterality:  Bilateral;    Current Outpatient Prescriptions on File Prior to Visit  Medication Sig Dispense Refill  . albuterol (VENTOLIN HFA) 108 (90 BASE) MCG/ACT inhaler Inhale 2 puffs into the lungs every 6 (six) hours as needed for wheezing. Every 4 hours 18 Inhaler 2  . buPROPion (WELLBUTRIN SR) 100 MG 12 hr tablet TAKE 2 TABLETS (200 MG TOTAL) BY MOUTH EVERY MORNING. (Patient taking differently: Take 1 tablet by mouth twice daily) 200 tablet 1  . cetirizine (ZYRTEC) 10 MG tablet Take 10 mg by mouth daily as needed for allergies. AM    . Diclofenac Sodium (PENNSAID) 2 % SOLN Place 2 puffs onto the skin 2 (two) times daily as needed.    . fluticasone (FLONASE) 50 MCG/ACT nasal spray USE 1 SPRAY IN EACH NOSTRIL ONCE DAILY *PT NEEDS OFFICE VISIT* 16 g 3  . losartan (COZAAR) 50 MG tablet TAKE 1 TABLET EVERY DAY 90 tablet 0  . pantoprazole (PROTONIX) 20 MG tablet TAKE 1 TABLET BY MOUTH EVERY DAY 90 tablet 1  . simvastatin (ZOCOR) 10 MG tablet TAKE 1 TABLET BY MOUTH AT BEDTIME 100 tablet 2  . sucralfate (CARAFATE) 1 GM/10ML suspension Take 10 mLs (1 g total) by mouth 4 (four) times daily. As needed for heartburn 420 mL 1  . traMADol (ULTRAM) 50 MG tablet  TAKE 1/2 TO 1 TABLET BY MOUTH 3 TIMES DAILY AS NEEDED 90 tablet 5  . meloxicam (MOBIC) 15 MG tablet Take 15 mg by mouth daily as needed for pain. Reported on 01/12/2016     No current facility-administered medications on file prior to visit.    Allergies  Allergen Reactions  . Aspirin Nausea And Vomiting    Family History  Problem Relation Age of Onset  . Lung disease Father   . Melanoma Mother   . Colon polyps Neg Hx   . Colon cancer Neg Hx   . Stomach cancer Neg Hx   . Pancreatic cancer Neg Hx   . Esophageal cancer Neg Hx     Social History   Social History  . Marital Status: Married    Spouse Name: N/A  . Number of Children: N/A  . Years of Education: N/A   Occupational History  . Not on file.   Social History Main Topics  .  Smoking status: Never Smoker   . Smokeless tobacco: Current User    Types: Chew     Comment: form given 11/24/15  . Alcohol Use: No     Comment: hx alcohol abuse  . Drug Use: No  . Sexual Activity: Not on file   Other Topics Concern  . Not on file   Social History Narrative    Review of Systems  Constitutional: Negative.   HENT: Negative.   Eyes: Negative.   Respiratory: Negative.   Cardiovascular: Negative.   Gastrointestinal: Negative.   Genitourinary: Negative.   Musculoskeletal: Negative.   Skin: Negative.   Neurological: Negative.   Endo/Heme/Allergies: Negative.   Psychiatric/Behavioral: Negative.   All other systems reviewed and are negative.   BP 132/88 mmHg  Temp(Src) 98.1 F (36.7 C) (Oral)  Wt 212 lb 1.6 oz (96.208 kg)  Physical Exam  Constitutional: He is oriented to person, place, and time and well-developed, well-nourished, and in no distress. No distress.  HENT:  Head: Normocephalic and atraumatic.  Right Ear: External ear normal.  Left Ear: External ear normal.  Nose: Nose normal.  Mouth/Throat: Oropharynx is clear and moist. No oropharyngeal exudate.  Eyes: Conjunctivae and EOM are normal. Pupils are equal, round, and reactive to light. Right eye exhibits no discharge. Left eye exhibits no discharge. No scleral icterus.  Neck: Normal range of motion. Neck supple. No JVD present. No tracheal deviation present. No thyromegaly present.  Cardiovascular: Normal rate, regular rhythm, normal heart sounds and intact distal pulses.  Exam reveals no gallop and no friction rub.   No murmur heard. Pulmonary/Chest: Effort normal and breath sounds normal. No stridor. No respiratory distress. He has no wheezes. He has no rales. He exhibits no tenderness.  Abdominal: Soft. Bowel sounds are normal. He exhibits no distension and no mass. There is no tenderness. There is no rebound and no guarding.  Musculoskeletal: Normal range of motion. He exhibits no edema or  tenderness.  Lymphadenopathy:    He has no cervical adenopathy.  Neurological: He is alert and oriented to person, place, and time. He has normal reflexes. Gait normal. GCS score is 15.  Skin: Skin is warm and dry. No rash noted. He is not diaphoretic. No erythema. No pallor.  Psychiatric: Mood, memory, affect and judgment normal.  Nursing note and vitals reviewed.   No results found for this or any previous visit (from the past 2160 hour(s)).  Assessment/Plan: 1. Routine general medical examination at a health care facility - POCT Urinalysis Dipstick (  Automated) - Basic metabolic panel - CBC with Differential/Platelet - Hemoglobin A1c - Hepatic function panel - Lipid panel - TSH - PSA - I would like him to improve diet and start exercising outside of work. - Follow up in one year or sooner if needed  2. Colon cancer screening - Ambulatory referral to Gastroenterology  3. Essential hypertension - Well controlled on current medication. No change in therapy at this time - POCT Urinalysis Dipstick (Automated) - Basic metabolic panel - CBC with Differential/Platelet - Hemoglobin A1c - Hepatic function panel - Lipid panel - TSH  4. Need for Tdap vaccination - Tdap vaccine greater than or equal to 7yo IM     Dorothyann Peng, NP

## 2016-02-13 ENCOUNTER — Encounter: Payer: Self-pay | Admitting: Gastroenterology

## 2016-03-05 ENCOUNTER — Encounter: Payer: Managed Care, Other (non HMO) | Admitting: Family Medicine

## 2016-04-02 ENCOUNTER — Ambulatory Visit (AMBULATORY_SURGERY_CENTER): Payer: Self-pay | Admitting: *Deleted

## 2016-04-02 VITALS — Ht 75.0 in | Wt 211.0 lb

## 2016-04-02 DIAGNOSIS — Z1211 Encounter for screening for malignant neoplasm of colon: Secondary | ICD-10-CM

## 2016-04-02 MED ORDER — NA SULFATE-K SULFATE-MG SULF 17.5-3.13-1.6 GM/177ML PO SOLN: 1.0000 | Freq: Once | ORAL | 0 refills | Status: AC

## 2016-04-02 NOTE — Progress Notes (Signed)
No egg or soy allergy. No anesthesia problems.  No home O2.  No diet meds.  

## 2016-04-03 ENCOUNTER — Encounter: Payer: Self-pay | Admitting: Gastroenterology

## 2016-04-04 ENCOUNTER — Other Ambulatory Visit: Payer: Self-pay | Admitting: Family Medicine

## 2016-04-04 NOTE — Telephone Encounter (Signed)
Rx refill sent to pharmacy. 

## 2016-04-16 ENCOUNTER — Ambulatory Visit (AMBULATORY_SURGERY_CENTER): Payer: 59 | Admitting: Gastroenterology

## 2016-04-16 ENCOUNTER — Encounter: Payer: Self-pay | Admitting: Gastroenterology

## 2016-04-16 VITALS — BP 96/66 | HR 76 | Temp 97.1°F | Resp 16 | Ht 72.0 in | Wt 211.0 lb

## 2016-04-16 DIAGNOSIS — Z1211 Encounter for screening for malignant neoplasm of colon: Secondary | ICD-10-CM

## 2016-04-16 DIAGNOSIS — K635 Polyp of colon: Secondary | ICD-10-CM

## 2016-04-16 DIAGNOSIS — D125 Benign neoplasm of sigmoid colon: Secondary | ICD-10-CM

## 2016-04-16 MED ORDER — SODIUM CHLORIDE 0.9 % IV SOLN: 500.0000 mL | INTRAVENOUS | Status: DC

## 2016-04-16 NOTE — Progress Notes (Signed)
Report given to PACU RN, vss 

## 2016-04-16 NOTE — Patient Instructions (Signed)
YOU HAD AN ENDOSCOPIC PROCEDURE TODAY AT THE [facility]:   Refer to the procedure report that was given to you for any specific questions about what was found during the examination.  If the procedure report does not answer your questions, please call your gastroenterologist to clarify.  If you requested that your care partner not be given the details of your procedure findings, then the procedure report has been included in a sealed envelope for you to review at your convenience later.  YOU SHOULD EXPECT: Some feelings of bloating in the abdomen. Passage of more gas than usual.  Walking can help get rid of the air that was put into your GI tract during the procedure and reduce the bloating. If you had a lower endoscopy (such as a colonoscopy or flexible sigmoidoscopy) you may notice spotting of blood in your stool or on the toilet paper. If you underwent a bowel prep for your procedure, you may not have a normal bowel movement for a few days.  Please Note:  You might notice some irritation and congestion in your nose or some drainage.  This is from the oxygen used during your procedure.  There is no need for concern and it should clear up in a day or so.  SYMPTOMS TO REPORT IMMEDIATELY:   Following lower endoscopy (colonoscopy or flexible sigmoidoscopy):  Excessive amounts of blood in the stool  Significant tenderness or worsening of abdominal pains  Swelling of the abdomen that is new, acute  Fever of 100F or higher   For urgent or emergent issues, a gastroenterologist can be reached at any hour by calling [phone].   DIET: Your first meal following the procedure should be a small meal and then it is ok to progress to your normal diet. Heavy or fried foods are harder to digest and may make you feel nauseous or bloated.  Likewise, meals heavy in dairy and vegetables can increase bloating.  Drink plenty of fluids but you should avoid alcoholic beverages for 24  hours.  ACTIVITY:  You should plan to take it easy for the rest of today and you should NOT DRIVE or use heavy machinery until tomorrow (because of the sedation medicines used during the test).    FOLLOW UP: Our staff will call the number listed on your records the next business day following your procedure to check on you and address any questions or concerns that you may have regarding the information given to you following your procedure. If we do not reach you, we will leave a message.  However, if you are feeling well and you are not experiencing any problems, there is no need to return our call.  We will assume that you have returned to your regular daily activities without incident.  If any biopsies were taken you will be contacted by phone or by letter within the next 1-3 weeks.  Please call us at [phone] if you have not heard about the biopsies in 3 weeks.    SIGNATURES/CONFIDENTIALITY: You and/or your care partner have signed paperwork which will be entered into your electronic medical record.  These signatures attest to the fact that that the information above on your After Visit Summary has been reviewed and is understood.  Full responsibility of the confidentiality of this discharge information lies with you and/or your care-partner.    Resume medications. Information given on polyps. 

## 2016-04-16 NOTE — Op Note (Signed)
[facility] Patient Name: [name] Procedure Date: 04/16/2016 11:16 AM MRN: [id number] Endoscopist: [name] , MD Age: 50 Referring MD:  Date of Birth: 27-May-1966 Gender: Male Account #: [id number] Procedure:                Colonoscopy Indications:              Screening for colorectal malignant neoplasm, This                            is the patient's first colonoscopy Medicines:                Monitored Anesthesia Care Procedure:                Pre-Anesthesia Assessment:                           - Prior to the procedure, a History and Physical                            was performed, and patient medications and                            allergies were reviewed. The patient's tolerance of                            previous anesthesia was also reviewed. The risks                            and benefits of the procedure and the sedation                            options and risks were discussed with the patient.                            All questions were answered, and informed consent                            was obtained. Prior Anticoagulants: The patient has                            taken no previous anticoagulant or antiplatelet                            agents. ASA Grade Assessment: II - A patient with                            mild systemic disease. After reviewing the risks                            and benefits, the patient was deemed in                            satisfactory condition to undergo the procedure.  After obtaining informed consent, the colonoscope                            was passed under direct vision. Throughout the                            procedure, the patient's blood pressure, pulse, and                            oxygen saturations were monitored continuously. The                            Model CF-HQ190L [id number] scope was introduced                            through the anus and  advanced to the the cecum,                            identified by appendiceal orifice and ileocecal                            valve. The colonoscopy was performed without                            difficulty. The patient tolerated the procedure                            well. The quality of the bowel preparation was                            good. The ileocecal valve, appendiceal orifice, and                            rectum were photographed. The bowel preparation                            used was SUPREP. Scope In: 11:24:16 AM Scope Out: 11:41:57 AM Scope Withdrawal Time: 0 hours 13 minutes 28 seconds  Total Procedure Duration: 0 hours 17 minutes 41 seconds  Findings:                 The perianal and digital rectal examinations were                            normal.                           A 2 mm polyp was found in the mid sigmoid colon.                            The polyp was sessile. The polyp was removed with a                            cold biopsy forceps. Resection and retrieval were  complete.                           The exam was otherwise without abnormality on                            direct and retroflexion views. Complications:            No immediate complications. Estimated Blood Loss:     Estimated blood loss: none. Impression:               - One 2 mm polyp in the mid sigmoid colon, removed                            with a cold biopsy forceps. Resected and retrieved.                           - The examination was otherwise normal on direct                            and retroflexion views. Recommendation:           - Patient has a contact number available for                            emergencies. The signs and symptoms of potential                            delayed complications were discussed with the                            patient. Return to normal activities tomorrow.                            Written discharge  instructions were provided to the                            patient.                           - Resume previous diet.                           - Continue present medications.                           - Await pathology results.                           - Repeat colonoscopy is recommended for                            surveillance. The colonoscopy date will be                            determined after pathology results from today's  exam become available for review. Carlise Stofer L. Loletha Carrow, MD 04/16/2016 11:44:43 AM This report has been signed electronically.

## 2016-04-16 NOTE — Progress Notes (Signed)
Called to room to assist during endoscopic procedure.  Patient ID and intended procedure confirmed with present staff. Received instructions for my participation in the procedure from the performing physician.  

## 2016-04-17 ENCOUNTER — Telehealth: Payer: Self-pay

## 2016-04-17 NOTE — Telephone Encounter (Signed)
  Follow up Call-  Call back number 04/16/2016  Post procedure Call Back phone  # [phone]  Permission to leave phone message Yes  Some recent data might be hidden     Patient questions:  Do you have a fever, pain , or abdominal swelling? No. Pain Score  0 *  Have you tolerated food without any problems? Yes.    Have you been able to return to your normal activities? Yes.    Do you have any questions about your discharge instructions: Diet   No. Medications  No. Follow up visit  No.  Do you have questions or concerns about your Care? No.  Actions: * If pain score is 4 or above: No action needed, pain <4.

## 2016-04-20 ENCOUNTER — Encounter: Payer: Self-pay | Admitting: Gastroenterology

## 2016-04-24 ENCOUNTER — Other Ambulatory Visit: Payer: Self-pay | Admitting: Family Medicine

## 2016-05-21 ENCOUNTER — Other Ambulatory Visit: Payer: Self-pay | Admitting: Family Medicine

## 2016-05-22 NOTE — Telephone Encounter (Signed)
Has established care with [facility].  Okay to refill??

## 2016-05-23 NOTE — Telephone Encounter (Signed)
Ok to refill for one month  

## 2016-05-23 NOTE — Telephone Encounter (Signed)
Ok to refill 

## 2016-05-24 NOTE — Telephone Encounter (Signed)
Rx called in as directed.   

## 2016-06-25 ENCOUNTER — Other Ambulatory Visit: Payer: Self-pay | Admitting: Family Medicine

## 2016-06-25 NOTE — Telephone Encounter (Signed)
Rx fluticasone sent to pharmacy.. Needs OV for further refills

## 2016-06-30 ENCOUNTER — Other Ambulatory Visit: Payer: Self-pay | Admitting: Adult Health

## 2016-07-02 NOTE — Telephone Encounter (Signed)
Ok to refill 

## 2016-07-03 NOTE — Telephone Encounter (Signed)
Rx called in as directed.   

## 2016-07-03 NOTE — Telephone Encounter (Signed)
Ok to refill 

## 2016-07-05 ENCOUNTER — Encounter: Payer: Self-pay | Admitting: Adult Health

## 2016-07-05 ENCOUNTER — Ambulatory Visit (INDEPENDENT_AMBULATORY_CARE_PROVIDER_SITE_OTHER): Payer: 59 | Admitting: Adult Health

## 2016-07-05 VITALS — BP 140/80 | Temp 98.6°F | Ht 72.0 in | Wt 212.2 lb

## 2016-07-05 DIAGNOSIS — B3742 Candidal balanitis: Secondary | ICD-10-CM

## 2016-07-05 DIAGNOSIS — K4090 Unilateral inguinal hernia, without obstruction or gangrene, not specified as recurrent: Secondary | ICD-10-CM

## 2016-07-05 DIAGNOSIS — K644 Residual hemorrhoidal skin tags: Secondary | ICD-10-CM | POA: Diagnosis not present

## 2016-07-05 MED ORDER — HYDROCORTISONE ACETATE 25 MG RE SUPP: 25.0000 mg | Freq: Two times a day (BID) | RECTAL | 0 refills | Status: DC

## 2016-07-05 NOTE — Progress Notes (Signed)
Subjective:    Patient ID: [name], male    DOB: 09-17-1965, 50 y.o.   MRN: [id number]  HPI  50 year old male who presents to the office today with multiple complaints.   1. He feels as though he pulled his right groin muscle. He is unsure of what caused the discomfort. He works part time as a Games developer and feels as though he  2. He also has a rash on his penis that he first noticed yesterday. The rash does not itch. Has no drainage . Has improved today   3. He has a " spot" in his left groin. He had this taken off in 2014.   4. Hemorrhoids.  - This has been an issues since June. He has been using Preparation H which does not help. He does endorse that he has had bright red blood on the toilet paper. He reports pain and itching. He has had normal bowel movement.   Review of Systems  Constitutional: Negative.   Respiratory: Negative.   Cardiovascular: Negative.   Genitourinary: Negative for difficulty urinating, discharge, dysuria, enuresis, flank pain, frequency, genital sores, hematuria, penile pain, penile swelling, scrotal swelling, testicular pain and urgency.  All other systems reviewed and are negative.  Past Medical History:  Diagnosis Date  . ALLERGIC RHINITIS   . ANXIETY   . ASTHMA   . BACK PAIN   . ERECTILE DYSFUNCTION   . Gastritis   . GERD   . HYPERLIPIDEMIA   . HYPERTENSION   . Hypertension   . LUMBAR STRAIN   . Motion sickness    Boats  . NECK PAIN   . Other acute sinusitis   . Pain in joint, hand   . PANCREATITIS, HX OF   . PONV (postoperative nausea and vomiting)    nausea only   . Seasonal allergies   . SHOULDER PAIN, LEFT   . Substance abuse    quit in 2007  . TENDINITIS, PATELLAR     Social History   Social History  . Marital status: Married    Spouse name: N/A  . Number of children: N/A  . Years of education: N/A   Occupational History  . Not on file.   Social History Main Topics  . Smoking status: Never Smoker  . Smokeless  tobacco: Current User    Types: Chew     Comment: form given 11/24/15  . Alcohol use No     Comment: hx alcohol abuse  . Drug use: No  . Sexual activity: Not on file   Other Topics Concern  . Not on file   Social History Narrative   Works as a Games developer   Married to [employer]           Past Surgical History:  Procedure Laterality Date  . IMAGE GUIDED SINUS SURGERY N/A 04/13/2015   Procedure: IMAGE GUIDED SINUS SURGERY;  Surgeon: Carloyn Manner, MD;  Location: [place];  Service: ENT;  Laterality: N/A;  GAVE DISK TO CE CE  . KNEE ARTHROSCOPY Right 2008  . KNEE SURGERY Left    x2  . MAXILLARY ANTROSTOMY Bilateral 04/13/2015   Procedure: MAXILLARY ANTROSTOMY;  Surgeon: Carloyn Manner, MD;  Location: [place];  Service: ENT;  Laterality: Bilateral;    Family History  Problem Relation Age of Onset  . Lung disease Father   . Melanoma Mother   . Heart attack Brother   . Colon polyps Neg Hx   . Colon  cancer Neg Hx   . Stomach cancer Neg Hx   . Pancreatic cancer Neg Hx   . Esophageal cancer Neg Hx     Allergies  Allergen Reactions  . Aspirin Nausea And Vomiting    Current Outpatient Prescriptions on File Prior to Visit  Medication Sig Dispense Refill  . albuterol (VENTOLIN HFA) 108 (90 BASE) MCG/ACT inhaler Inhale 2 puffs into the lungs every 6 (six) hours as needed for wheezing. Every 4 hours 18 Inhaler 2  . buPROPion (WELLBUTRIN SR) 100 MG 12 hr tablet TAKE 2 TABLETS BY MOUTH EVERY MORNING 200 tablet 3  . cetirizine (ZYRTEC) 10 MG tablet Take 10 mg by mouth daily as needed for allergies. AM    . Diclofenac Sodium (PENNSAID) 2 % SOLN Place 2 puffs onto the skin 2 (two) times daily as needed.    Marland Kitchen esomeprazole (NEXIUM) 20 MG packet Take 20 mg by mouth daily before breakfast.    . fluticasone (FLONASE) 50 MCG/ACT nasal spray USE 1 SPRAY IN EACH NOSTRIL ONCE DAILY *PT NEEDS OFFICE VISIT* 16 g 0  . losartan (COZAAR) 50 MG tablet TAKE 1 TABLET EVERY DAY  90 tablet 2  . simvastatin (ZOCOR) 10 MG tablet TAKE 1 TABLET BY MOUTH AT BEDTIME 100 tablet 2  . traMADol (ULTRAM) 50 MG tablet TAKE 1/2 TO 1 TABLET 3 TIMES A DAY AS NEEDED 90 tablet 0   Current Facility-Administered Medications on File Prior to Visit  Medication Dose Route Frequency Provider Last Rate Last Dose  . 0.9 %  sodium chloride infusion  500 mL Intravenous Continuous Nelida Meuse III, MD        BP 140/80   Temp 98.6 F (37 C) (Oral)   Ht 6' (1.829 m)   Wt 212 lb 3.2 oz (96.3 kg)   BMI 28.78 kg/m       Objective:   Physical Exam  Constitutional: He is oriented to person, place, and time. He appears well-developed and well-nourished. No distress.  Cardiovascular: Normal rate, regular rhythm, normal heart sounds and intact distal pulses.  Exam reveals no gallop and no friction rub.   No murmur heard. Pulmonary/Chest: Effort normal and breath sounds normal. No respiratory distress. He has no wheezes. He has no rales. He exhibits no tenderness.  Abdominal: A hernia is present. Hernia confirmed positive in the right inguinal area.  Genitourinary: Rectal exam shows external hemorrhoid.  Neurological: He is alert and oriented to person, place, and time.  Skin: Skin is warm and dry. Rash (slightly red area on foreskin. No vesicles noted, No drainage or discharge. Area of rough skin at base of penis. Does not appear to be genital warts. ) noted. No erythema. No pallor.  Psychiatric: He has a normal mood and affect. His behavior is normal. Judgment and thought content normal.  Nursing note and vitals reviewed.     Assessment & Plan:  1. Right inguinal hernia - Appears as a small inguinal hernia.  - Does not appear incarcerated.  - Advised patient for follow up precautions - Consider referral to Gen Surg  2. External hemorrhoids - He has an appointment with GI in December about this issue.  Hemorrhoids do not appear thrombosed.  - hydrocortisone (ANUSOL-HC) 25 MG  suppository; Place 1 suppository (25 mg total) rectally 2 (two) times daily.  Dispense: 12 suppository; Refill: 0  3. Candidal balanitis - - OTC hydrocortisone cream and anti fungal. Apply twice a day  - Follow up if no improvement  Dorothyann Peng, NP

## 2016-07-20 ENCOUNTER — Telehealth: Payer: Self-pay | Admitting: Adult Health

## 2016-07-20 NOTE — Telephone Encounter (Signed)
Pt would like Cory to give him a call back.  Declined to elaborate.

## 2016-07-21 ENCOUNTER — Other Ambulatory Visit: Payer: Self-pay | Admitting: Family Medicine

## 2016-07-23 NOTE — Telephone Encounter (Signed)
See below

## 2016-07-24 NOTE — Telephone Encounter (Signed)
Returned patient call. Left VM to call back.

## 2016-07-31 ENCOUNTER — Telehealth: Payer: Self-pay | Admitting: Adult Health

## 2016-07-31 ENCOUNTER — Other Ambulatory Visit: Payer: Self-pay | Admitting: Adult Health

## 2016-07-31 DIAGNOSIS — K644 Residual hemorrhoidal skin tags: Secondary | ICD-10-CM

## 2016-07-31 MED ORDER — HYDROCORTISONE ACETATE 25 MG RE SUPP: 25.0000 mg | Freq: Two times a day (BID) | RECTAL | 1 refills | Status: DC

## 2016-07-31 MED ORDER — TRAMADOL HCL 50 MG PO TABS: ORAL_TABLET | ORAL | 0 refills | Status: DC

## 2016-07-31 NOTE — Telephone Encounter (Signed)
Please advise. Ok to refill Tramadol?

## 2016-07-31 NOTE — Telephone Encounter (Signed)
Rx called in as directed.   

## 2016-07-31 NOTE — Telephone Encounter (Signed)
Prescriptions sent in for tramadol and suppositories

## 2016-07-31 NOTE — Telephone Encounter (Signed)
° ° ° °  Pt call to ask for a refill on something that you gave him for hemorrhoids said he did not no the name of the med.    Pt request refill of the following:   traMADol (ULTRAM) 50 MG tablet   Phamacy: [facility]

## 2016-08-17 ENCOUNTER — Ambulatory Visit: Payer: 59 | Admitting: Gastroenterology

## 2016-08-30 ENCOUNTER — Telehealth: Payer: Self-pay | Admitting: Adult Health

## 2016-08-30 ENCOUNTER — Other Ambulatory Visit: Payer: Self-pay

## 2016-08-30 NOTE — Telephone Encounter (Signed)
Ok to refill 

## 2016-08-30 NOTE — Telephone Encounter (Signed)
Pt needs refill on tramadol [facility] [address]

## 2016-08-30 NOTE — Telephone Encounter (Signed)
Rx has been called in and patient has been notified to contact our office and let us know about pain management referral for further refills of this medication.

## 2016-08-30 NOTE — Telephone Encounter (Signed)
Ok to refill.   Please explain new prescribing rules

## 2016-09-11 ENCOUNTER — Telehealth: Payer: Self-pay | Admitting: Adult Health

## 2016-09-11 NOTE — Telephone Encounter (Signed)
Pt returning your call about pain management

## 2016-09-12 ENCOUNTER — Other Ambulatory Visit: Payer: Self-pay | Admitting: Family Medicine

## 2016-09-12 NOTE — Telephone Encounter (Signed)
Left message for patient to return phone call.  

## 2016-09-17 NOTE — Telephone Encounter (Signed)
Pt presented at front desk to speak with you.  Stated he called last week to speak with you and hasn't heard back.  I advised pt that you called on 09/12/16 at 4pm and LVM.  Pt asked if you could call back when you can.  Best number is [phone]

## 2016-09-18 ENCOUNTER — Other Ambulatory Visit: Payer: Self-pay | Admitting: Adult Health

## 2016-09-18 DIAGNOSIS — M1712 Unilateral primary osteoarthritis, left knee: Secondary | ICD-10-CM

## 2016-09-18 NOTE — Telephone Encounter (Signed)
Referral has been placed. 

## 2016-09-18 NOTE — Telephone Encounter (Signed)
I contacted patient - he states he would like a referral to pain management. I advised patient that I would place referral, and someone from pain management would be giving him a call to set up an appt. Patient verbalized understanding.

## 2016-09-24 DIAGNOSIS — S29011A Strain of muscle and tendon of front wall of thorax, initial encounter: Secondary | ICD-10-CM | POA: Diagnosis not present

## 2016-10-02 ENCOUNTER — Telehealth: Payer: Self-pay | Admitting: Adult Health

## 2016-10-02 NOTE — Telephone Encounter (Signed)
Ok to refill for one month  

## 2016-10-02 NOTE — Telephone Encounter (Signed)
Patient is aware that the referral for pain management has been sent. As of the 15th of January they are still reviewing the medical records to allow another 4-6 weeks to hear back. Patient is requesting an RX refill on traMADol (ULTRAM) 50 MG tablet.if this is approve please sent to the pharmacy.  Pharmacy: [facility] [address]: [phone]

## 2016-10-02 NOTE — Telephone Encounter (Signed)
Please advise 

## 2016-10-03 ENCOUNTER — Other Ambulatory Visit: Payer: Self-pay

## 2016-10-03 MED ORDER — TRAMADOL HCL 50 MG PO TABS: ORAL_TABLET | ORAL | 0 refills | Status: DC

## 2016-10-03 NOTE — Telephone Encounter (Signed)
Rx has been called in as directed.  

## 2016-11-02 ENCOUNTER — Encounter: Payer: Self-pay | Admitting: Physical Medicine & Rehabilitation

## 2016-11-09 ENCOUNTER — Other Ambulatory Visit: Payer: Self-pay | Admitting: Adult Health

## 2016-11-09 NOTE — Telephone Encounter (Signed)
Ok to refill for one year  

## 2016-12-21 ENCOUNTER — Encounter: Payer: Self-pay | Admitting: Physical Medicine & Rehabilitation

## 2016-12-21 ENCOUNTER — Encounter: Payer: 59 | Attending: Physical Medicine & Rehabilitation | Admitting: Physical Medicine & Rehabilitation

## 2016-12-21 VITALS — BP 126/83 | HR 98

## 2016-12-21 DIAGNOSIS — F1010 Alcohol abuse, uncomplicated: Secondary | ICD-10-CM | POA: Insufficient documentation

## 2016-12-21 DIAGNOSIS — J45909 Unspecified asthma, uncomplicated: Secondary | ICD-10-CM | POA: Insufficient documentation

## 2016-12-21 DIAGNOSIS — K2970 Gastritis, unspecified, without bleeding: Secondary | ICD-10-CM | POA: Insufficient documentation

## 2016-12-21 DIAGNOSIS — K859 Acute pancreatitis without necrosis or infection, unspecified: Secondary | ICD-10-CM | POA: Diagnosis not present

## 2016-12-21 DIAGNOSIS — S39012A Strain of muscle, fascia and tendon of lower back, initial encounter: Secondary | ICD-10-CM | POA: Diagnosis not present

## 2016-12-21 DIAGNOSIS — M542 Cervicalgia: Secondary | ICD-10-CM | POA: Diagnosis not present

## 2016-12-21 DIAGNOSIS — F419 Anxiety disorder, unspecified: Secondary | ICD-10-CM | POA: Insufficient documentation

## 2016-12-21 DIAGNOSIS — M25562 Pain in left knee: Secondary | ICD-10-CM | POA: Insufficient documentation

## 2016-12-21 DIAGNOSIS — K219 Gastro-esophageal reflux disease without esophagitis: Secondary | ICD-10-CM | POA: Insufficient documentation

## 2016-12-21 DIAGNOSIS — I10 Essential (primary) hypertension: Secondary | ICD-10-CM | POA: Diagnosis not present

## 2016-12-21 DIAGNOSIS — G8929 Other chronic pain: Secondary | ICD-10-CM | POA: Insufficient documentation

## 2016-12-21 DIAGNOSIS — N529 Male erectile dysfunction, unspecified: Secondary | ICD-10-CM | POA: Insufficient documentation

## 2016-12-21 DIAGNOSIS — E785 Hyperlipidemia, unspecified: Secondary | ICD-10-CM | POA: Diagnosis not present

## 2016-12-21 DIAGNOSIS — M25561 Pain in right knee: Secondary | ICD-10-CM | POA: Insufficient documentation

## 2016-12-21 MED ORDER — DULOXETINE HCL 30 MG PO CPEP: 30.0000 mg | ORAL_CAPSULE | Freq: Every day | ORAL | 1 refills | Status: DC

## 2016-12-21 MED ORDER — METHOCARBAMOL 500 MG PO TABS: 500.0000 mg | ORAL_TABLET | Freq: Two times a day (BID) | ORAL | 1 refills | Status: DC | PRN

## 2016-12-21 MED ORDER — TRAMADOL HCL 50 MG PO TABS: 50.0000 mg | ORAL_TABLET | Freq: Three times a day (TID) | ORAL | 1 refills | Status: DC | PRN

## 2016-12-21 NOTE — Progress Notes (Signed)
Subjective:    Patient ID: [name], male    DOB: September 11, 1965, 51 y.o.   MRN: [id number]  HPI 51 y/o male with pmh/psh of anxiety, chronic pain, pancreatitis, etoh abuse, meniscal repair x2 in left knee and 1 in right knee presents with b/l knee pain L>R. Started ~2012.  Denies inciting event.  He does do carpentry work.  Stable.  Pt continues to work and notes it gets worse when he works a lot.  Sharp pain.  Nonradiating.  Constant.  Tramadol and Ice helps.  He had b/l steroid injections by ortho that lasted ~1 month. Denies associated numbness/tingling/weakness. Denies falls. Does not limit activities, but makes work uncomfortable.   Pain Inventory Average Pain 4 Pain Right Now 4 My pain is sharp, burning and aching  In the last 24 hours, has pain interfered with the following? General activity 4 Relation with others 4 Enjoyment of life 4 What TIME of day is your pain at its worst? all Sleep (in general) Good  Pain is worse with: walking and bending Pain improves with: heat/ice, medication and injections Relief from Meds: 2  Mobility walk without assistance ability to climb steps?  yes do you drive?  yes  Function employed # of hrs/week 40+  Neuro/Psych No problems in this area  Prior Studies Any changes since last visit?  no  Physicians involved in your care Any changes since last visit?  no   Family History  Problem Relation Age of Onset  . Lung disease Father   . Melanoma Mother   . Heart attack Brother   . Colon polyps Neg Hx   . Colon cancer Neg Hx   . Stomach cancer Neg Hx   . Pancreatic cancer Neg Hx   . Esophageal cancer Neg Hx    Social History   Social History  . Marital status: Married    Spouse name: N/A  . Number of children: N/A  . Years of education: N/A   Social History Main Topics  . Smoking status: Never Smoker  . Smokeless tobacco: Current User    Types: Chew     Comment: form given 11/24/15  . Alcohol use No     Comment: hx  alcohol abuse  . Drug use: No  . Sexual activity: Not on file   Other Topics Concern  . Not on file   Social History Narrative   Works as a Games developer   Married to [employer]          Past Surgical History:  Procedure Laterality Date  . IMAGE GUIDED SINUS SURGERY N/A 04/13/2015   Procedure: IMAGE GUIDED SINUS SURGERY;  Surgeon: Carloyn Manner, MD;  Location: [place];  Service: ENT;  Laterality: N/A;  GAVE DISK TO CE CE  . KNEE ARTHROSCOPY Right 2008  . KNEE SURGERY Left    x2  . MAXILLARY ANTROSTOMY Bilateral 04/13/2015   Procedure: MAXILLARY ANTROSTOMY;  Surgeon: Carloyn Manner, MD;  Location: [place];  Service: ENT;  Laterality: Bilateral;   Past Medical History:  Diagnosis Date  . ALLERGIC RHINITIS   . ANXIETY   . ASTHMA   . BACK PAIN   . ERECTILE DYSFUNCTION   . Gastritis   . GERD   . HYPERLIPIDEMIA   . HYPERTENSION   . Hypertension   . LUMBAR STRAIN   . Motion sickness    Boats  . NECK PAIN   . Other acute sinusitis   . Pain in joint, hand   .  PANCREATITIS, HX OF   . PONV (postoperative nausea and vomiting)    nausea only   . Seasonal allergies   . SHOULDER PAIN, LEFT   . Substance abuse    quit in 2007  . TENDINITIS, PATELLAR    BP 126/83   Pulse 98   SpO2 95%   Opioid Risk Score:   Fall Risk Score:  `1  Depression screen PHQ 2/9  Depression screen PHQ 2/9 12/21/2016  Decreased Interest 0  Down, Depressed, Hopeless 0  PHQ - 2 Score 0  Altered sleeping 0  Tired, decreased energy 0  Change in appetite 0  Feeling bad or failure about yourself  0  Trouble concentrating 0  Moving slowly or fidgety/restless 0  Suicidal thoughts 0  PHQ-9 Score 0  Difficult doing work/chores Not difficult at all   Review of Systems  Constitutional: Negative.   HENT: Negative.   Eyes: Negative.   Respiratory: Negative.   Cardiovascular: Negative.   Gastrointestinal: Positive for constipation.  Endocrine: Negative.   Genitourinary:  Negative.   Musculoskeletal: Positive for arthralgias and joint swelling.  Skin: Negative.   Allergic/Immunologic: Negative.   Neurological: Negative.   Hematological: Negative.   Psychiatric/Behavioral: Negative.   All other systems reviewed and are negative.     Objective:   Physical Exam  Gen: NAD. Vital signs reviewed HENT: Normocephalic, Atraumatic Eyes: EOMI. No discharge.  Cardio: RRR. No JVD. Pulm: B/l clear to auscultation.  Effort normal Abd: Soft, BS+ MSK:  Gait WNL, Genu Varus.   Mild TTP left medial knee  No edema.   Neg FABERs.   Neg McMurrays  Neg Varus/Valgus laxity Neuro:   Sensation intact to light touch in all LE dermatomes  Reflexes 2+ throughout  Strength  5/5 in all LE myotomes  SLR neg b/l Skin: Warm and Dry. Intact    Assessment & Plan:  51 y/o male with pmh/psh of anxiety, chronic pain, pancreatitis, etoh abuse, meniscal repair x2 in left knee and 1 in right knee presents with b/l knee pain L>R.   1. Chronic b/l knee pain L>R  MRI performed, images requested  Pt has had steroid injections, but they are decreasing in duration efficacy  Labs reviewed  Referral information reviewed  [place] reviewed  Cont Cold, trial heat  Had PT in 2016, with benefit  Cont Bracing during periods of excessive activity  Cont Pennsaid gel per Ortho  Will order TENS unit as patient has had benefit in the past  Will order Cymbalta 30mg , consider increase to 60 on next visit  Will order Robaxin 500 BID PRN  Cont Tramadol TID PRN (educated on signs/symptoms of serotonin syndrome)  Encouraged work modification

## 2017-01-17 ENCOUNTER — Ambulatory Visit (INDEPENDENT_AMBULATORY_CARE_PROVIDER_SITE_OTHER): Payer: 59 | Admitting: Adult Health

## 2017-01-17 ENCOUNTER — Encounter: Payer: Self-pay | Admitting: Adult Health

## 2017-01-17 VITALS — BP 124/70 | Temp 98.2°F | Ht 72.0 in | Wt 213.9 lb

## 2017-01-17 DIAGNOSIS — M25562 Pain in left knee: Secondary | ICD-10-CM | POA: Diagnosis not present

## 2017-01-17 DIAGNOSIS — M25561 Pain in right knee: Secondary | ICD-10-CM

## 2017-01-17 DIAGNOSIS — G8929 Other chronic pain: Secondary | ICD-10-CM

## 2017-01-17 MED ORDER — TRAMADOL HCL 50 MG PO TABS: 50.0000 mg | ORAL_TABLET | Freq: Three times a day (TID) | ORAL | 0 refills | Status: DC | PRN

## 2017-01-17 NOTE — Progress Notes (Signed)
Subjective:    Patient ID: [name], male    DOB: July 17, 1966, 51 y.o.   MRN: [id number]  HPI  51 year old male who  has a past medical history of ALLERGIC RHINITIS; ANXIETY; ASTHMA; BACK PAIN; ERECTILE DYSFUNCTION; Gastritis; GERD; HYPERLIPIDEMIA; HYPERTENSION; Hypertension; LUMBAR STRAIN; Motion sickness; NECK PAIN; Other acute sinusitis; Pain in joint, hand; PANCREATITIS, HX OF; PONV (postoperative nausea and vomiting); Seasonal allergies; SHOULDER PAIN, LEFT; Substance abuse; and TENDINITIS, PATELLAR.  He presents to the office today for follow up regarding knee pain. He has been seen by pain management and feel as though " we did not click." He was prescribed Cymbalta ( was not covered by insurance). He was also given a prescription for a TENS unit, which he did not know where to go to get it.   He continues to take Tramadol as needed and feels as though this is doing a decent job to control bilateral knee pain.   Review of Systems See HPI   Past Medical History:  Diagnosis Date  . ALLERGIC RHINITIS   . ANXIETY   . ASTHMA   . BACK PAIN   . ERECTILE DYSFUNCTION   . Gastritis   . GERD   . HYPERLIPIDEMIA   . HYPERTENSION   . Hypertension   . LUMBAR STRAIN   . Motion sickness    Boats  . NECK PAIN   . Other acute sinusitis   . Pain in joint, hand   . PANCREATITIS, HX OF   . PONV (postoperative nausea and vomiting)    nausea only   . Seasonal allergies   . SHOULDER PAIN, LEFT   . Substance abuse    quit in 2007  . TENDINITIS, PATELLAR     Social History   Social History  . Marital status: Married    Spouse name: N/A  . Number of children: N/A  . Years of education: N/A   Occupational History  . Not on file.   Social History Main Topics  . Smoking status: Never Smoker  . Smokeless tobacco: Current User    Types: Chew     Comment: form given 11/24/15  . Alcohol use No     Comment: hx alcohol abuse  . Drug use: No  . Sexual activity: Not on file    Other Topics Concern  . Not on file   Social History Narrative   Works as a Games developer   Married to [employer]           Past Surgical History:  Procedure Laterality Date  . IMAGE GUIDED SINUS SURGERY N/A 04/13/2015   Procedure: IMAGE GUIDED SINUS SURGERY;  Surgeon: Carloyn Manner, MD;  Location: [place];  Service: ENT;  Laterality: N/A;  GAVE DISK TO CE CE  . KNEE ARTHROSCOPY Right 2008  . KNEE SURGERY Left    x2  . MAXILLARY ANTROSTOMY Bilateral 04/13/2015   Procedure: MAXILLARY ANTROSTOMY;  Surgeon: Carloyn Manner, MD;  Location: [place];  Service: ENT;  Laterality: Bilateral;    Family History  Problem Relation Age of Onset  . Lung disease Father   . Melanoma Mother   . Heart attack Brother   . Colon polyps Neg Hx   . Colon cancer Neg Hx   . Stomach cancer Neg Hx   . Pancreatic cancer Neg Hx   . Esophageal cancer Neg Hx     Allergies  Allergen Reactions  . Aspirin Nausea And Vomiting    Current  Outpatient Prescriptions on File Prior to Visit  Medication Sig Dispense Refill  . buPROPion (WELLBUTRIN SR) 100 MG 12 hr tablet TAKE 2 TABLETS BY MOUTH EVERY MORNING 200 tablet 3  . cetirizine (ZYRTEC) 10 MG tablet Take 10 mg by mouth daily as needed for allergies. AM    . Diclofenac Sodium (PENNSAID) 2 % SOLN Place 2 puffs onto the skin 2 (two) times daily as needed.    Marland Kitchen esomeprazole (NEXIUM) 20 MG packet Take 20 mg by mouth daily before breakfast.    . fluticasone (FLONASE) 50 MCG/ACT nasal spray USE 1 SPRAY IN EACH NOSTRIL ONCE DAILY *PT NEEDS OFFICE VISIT* 32 g 6  . losartan (COZAAR) 50 MG tablet TAKE 1 TABLET EVERY DAY 90 tablet 3  . methocarbamol (ROBAXIN) 500 MG tablet Take 1 tablet (500 mg total) by mouth 2 (two) times daily as needed for muscle spasms. 60 tablet 1  . simvastatin (ZOCOR) 10 MG tablet TAKE 1 TABLET BY MOUTH AT BEDTIME 100 tablet 2   Current Facility-Administered Medications on File Prior to Visit  Medication Dose Route  Frequency Provider Last Rate Last Dose  . 0.9 %  sodium chloride infusion  500 mL Intravenous Continuous Danis, Estill Cotta III, MD        BP 124/70 (BP Location: Left Arm, Patient Position: Sitting, Cuff Size: Normal)   Temp 98.2 F (36.8 C) (Oral)   Ht 6' (1.829 m)   Wt 213 lb 14.4 oz (97 kg)   BMI 29.01 kg/m      Objective:   Physical Exam  Constitutional: He is oriented to person, place, and time. He appears well-developed and well-nourished. No distress.  Cardiovascular: Normal rate, regular rhythm, normal heart sounds and intact distal pulses.  Exam reveals no gallop and no friction rub.   No murmur heard. Pulmonary/Chest: Effort normal and breath sounds normal. No respiratory distress. He has no wheezes. He has no rales. He exhibits no tenderness.  Musculoskeletal: He exhibits tenderness (bilateral knee pain ).  Neurological: He is alert and oriented to person, place, and time.  Skin: Skin is warm and dry. No rash noted. He is not diaphoretic. No erythema. No pallor.  Psychiatric: He has a normal mood and affect. His behavior is normal. Judgment and thought content normal.  Nursing note and vitals reviewed.     Assessment & Plan:  1. Chronic pain of both knees - traMADol (ULTRAM) 50 MG tablet; Take 1 tablet (50 mg total) by mouth 3 (three) times daily as needed.  Dispense: 90 tablet; Refill: 0 - Encouraged to go to [facility] for TENS unit.  - He can follow up with orthopedics.   Dorothyann Peng, NP

## 2017-01-18 ENCOUNTER — Encounter: Payer: 59 | Admitting: Physical Medicine & Rehabilitation

## 2017-02-01 ENCOUNTER — Ambulatory Visit (INDEPENDENT_AMBULATORY_CARE_PROVIDER_SITE_OTHER): Payer: 59 | Admitting: Adult Health

## 2017-02-01 ENCOUNTER — Encounter: Payer: Self-pay | Admitting: Adult Health

## 2017-02-01 VITALS — BP 118/74 | Temp 98.6°F | Ht 72.0 in | Wt 213.1 lb

## 2017-02-01 DIAGNOSIS — I10 Essential (primary) hypertension: Secondary | ICD-10-CM

## 2017-02-01 DIAGNOSIS — E785 Hyperlipidemia, unspecified: Secondary | ICD-10-CM | POA: Diagnosis not present

## 2017-02-01 DIAGNOSIS — Z0000 Encounter for general adult medical examination without abnormal findings: Secondary | ICD-10-CM | POA: Diagnosis not present

## 2017-02-01 LAB — CBC WITH DIFFERENTIAL/PLATELET
BASOS ABS: 0 10*3/uL (ref 0.0–0.1)
BASOS PCT: 0.7 % (ref 0.0–3.0)
EOS PCT: 2.9 % (ref 0.0–5.0)
Eosinophils Absolute: 0.1 10*3/uL (ref 0.0–0.7)
HEMATOCRIT: 45.7 % (ref 39.0–52.0)
Hemoglobin: 15.9 g/dL (ref 13.0–17.0)
LYMPHS PCT: 27.2 % (ref 12.0–46.0)
Lymphs Abs: 1.3 10*3/uL (ref 0.7–4.0)
MCHC: 34.7 g/dL (ref 30.0–36.0)
MCV: 86.4 fl (ref 78.0–100.0)
MONOS PCT: 6.7 % (ref 3.0–12.0)
Monocytes Absolute: 0.3 10*3/uL (ref 0.1–1.0)
NEUTROS ABS: 3.1 10*3/uL (ref 1.4–7.7)
Neutrophils Relative %: 62.5 % (ref 43.0–77.0)
PLATELETS: 165 10*3/uL (ref 150.0–400.0)
RBC: 5.29 Mil/uL (ref 4.22–5.81)
RDW: 12.3 % (ref 11.5–15.5)
WBC: 4.9 10*3/uL (ref 4.0–10.5)

## 2017-02-01 LAB — BASIC METABOLIC PANEL
BUN: 21 mg/dL (ref 6–23)
CALCIUM: 9.2 mg/dL (ref 8.4–10.5)
CHLORIDE: 103 meq/L (ref 96–112)
CO2: 27 meq/L (ref 19–32)
Creatinine, Ser: 1 mg/dL (ref 0.40–1.50)
GFR: 83.77 mL/min (ref >60.00)
Glucose, Bld: 112 mg/dL — ABNORMAL HIGH (ref 70–99)
POTASSIUM: 4.3 meq/L (ref 3.5–5.1)
SODIUM: 138 meq/L (ref 135–145)

## 2017-02-01 LAB — HEPATIC FUNCTION PANEL
ALBUMIN: 4.8 g/dL (ref 3.5–5.2)
ALT: 34 U/L (ref 0–53)
AST: 21 U/L (ref 0–37)
Alkaline Phosphatase: 53 U/L (ref 39–117)
Bilirubin, Direct: 0.1 mg/dL (ref 0.0–0.3)
TOTAL PROTEIN: 6.6 g/dL (ref 6.0–8.3)
Total Bilirubin: 0.8 mg/dL (ref 0.2–1.2)

## 2017-02-01 LAB — LIPID PANEL
Cholesterol: 185 mg/dL (ref 0–200)
HDL: 43.4 mg/dL (ref >39.00)
LDL Cholesterol: 108 mg/dL — ABNORMAL HIGH (ref 0–99)
NONHDL: 141.78
Total CHOL/HDL Ratio: 4
Triglycerides: 167 mg/dL — ABNORMAL HIGH (ref 0.0–149.0)
VLDL: 33.4 mg/dL (ref 0.0–40.0)

## 2017-02-01 LAB — TSH: TSH: 4.15 u[IU]/mL (ref 0.35–4.50)

## 2017-02-01 LAB — PSA: PSA: 1.62 ng/mL (ref 0.10–4.00)

## 2017-02-01 NOTE — Progress Notes (Addendum)
Subjective:    Patient ID: [name], male    DOB: [birth year], 51 y.o.   MRN: [id number]  HPI  Patient presents for yearly preventative medicine examination. He is a pleasant 51 year old male who  has a past medical history of ALLERGIC RHINITIS; ANXIETY; ASTHMA; BACK PAIN; ERECTILE DYSFUNCTION; GERD; HYPERLIPIDEMIA; HYPERTENSION; LUMBAR STRAIN; NECK PAIN; PANCREATITIS, HX OF; PONV (postoperative nausea and vomiting); Seasonal allergies; SHOULDER PAIN, LEFT; Substance abuse; and TENDINITIS, PATELLAR.   All immunizations and health maintenance protocols were reviewed with the patient and needed orders were placed.  Appropriate screening laboratory values were ordered for the patient including screening of hyperlipidemia, renal function and hepatic function. If indicated by BPH, a PSA was ordered.  Medication reconciliation,  past medical history, social history, problem list and allergies were reviewed in detail with the patient  Goals were established with regard to weight loss, exercise, and  diet in compliance with medications  His blood pressure is controlled with Cozaar 50 mg   He takes Zocor 10 mg for hyperlipidemia.   He does have a history of depression and takes Wellbutrin.   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   He is up to date on health maintenance items such as colonoscopy, dental and vision care.   Review of Systems  Constitutional: Negative.   HENT: Negative.   Eyes: Negative.   Respiratory: Negative.   Cardiovascular: Negative.   Gastrointestinal: Negative.   Endocrine: Negative.   Genitourinary: Negative.   Musculoskeletal: Positive for arthralgias and back pain.  Skin: Negative.   Allergic/Immunologic: Negative.   Neurological: Negative.   Hematological: Negative.   Psychiatric/Behavioral: Negative.   All other systems reviewed and are negative.  Past Medical History:    Diagnosis Date  . ALLERGIC RHINITIS   . ANXIETY   . ASTHMA   . BACK PAIN   . ERECTILE DYSFUNCTION   . GERD   . HYPERLIPIDEMIA   . HYPERTENSION   . LUMBAR STRAIN   . NECK PAIN   . PANCREATITIS, HX OF   . PONV (postoperative nausea and vomiting)    nausea only   . Seasonal allergies   . SHOULDER PAIN, LEFT   . Substance abuse    quit in 2007  . TENDINITIS, PATELLAR     Social History   Social History  . Marital status: Married    Spouse name: N/A  . Number of children: N/A  . Years of education: N/A   Occupational History  . Not on file.   Social History Main Topics  . Smoking status: Never Smoker  . Smokeless tobacco: Current User    Types: Chew     Comment: form given 11/24/15  . Alcohol use No     Comment: hx alcohol abuse  . Drug use: No  . Sexual activity: Not on file   Other Topics Concern  . Not on file   Social History Narrative   Works as a Games developer   Married to [employer]           Past Surgical History:  Procedure Laterality Date  . IMAGE GUIDED SINUS SURGERY N/A 04/13/2015   Procedure: IMAGE GUIDED SINUS SURGERY;  Surgeon: Carloyn Manner, MD;  Location: [facility];  Service: ENT;  Laterality: N/A;  GAVE DISK TO CE CE  . KNEE ARTHROSCOPY Right 2008  . KNEE SURGERY Left    x2  .  MAXILLARY ANTROSTOMY Bilateral 04/13/2015   Procedure: MAXILLARY ANTROSTOMY;  Surgeon: Carloyn Manner, MD;  Location: [place];  Service: ENT;  Laterality: Bilateral;    Family History  Problem Relation Age of Onset  . Lung disease Father   . Melanoma Mother   . Heart attack Brother   . Colon polyps Neg Hx   . Colon cancer Neg Hx   . Stomach cancer Neg Hx   . Pancreatic cancer Neg Hx   . Esophageal cancer Neg Hx     Allergies  Allergen Reactions  . Aspirin Nausea And Vomiting    Current Outpatient Prescriptions on File Prior to Visit  Medication Sig Dispense Refill  . buPROPion (WELLBUTRIN SR) 100 MG 12 hr tablet TAKE 2 TABLETS BY  MOUTH EVERY MORNING 200 tablet 3  . cetirizine (ZYRTEC) 10 MG tablet Take 10 mg by mouth daily as needed for allergies. AM    . Diclofenac Sodium (PENNSAID) 2 % SOLN Place 2 puffs onto the skin 2 (two) times daily as needed.    Marland Kitchen esomeprazole (NEXIUM) 20 MG packet Take 20 mg by mouth daily before breakfast.    . fluticasone (FLONASE) 50 MCG/ACT nasal spray USE 1 SPRAY IN EACH NOSTRIL ONCE DAILY *PT NEEDS OFFICE VISIT* 32 g 6  . losartan (COZAAR) 50 MG tablet TAKE 1 TABLET EVERY DAY 90 tablet 3  . methocarbamol (ROBAXIN) 500 MG tablet Take 1 tablet (500 mg total) by mouth 2 (two) times daily as needed for muscle spasms. 60 tablet 1  . simvastatin (ZOCOR) 10 MG tablet TAKE 1 TABLET BY MOUTH AT BEDTIME 100 tablet 2  . traMADol (ULTRAM) 50 MG tablet Take 1 tablet (50 mg total) by mouth 3 (three) times daily as needed. 90 tablet 0   No current facility-administered medications on file prior to visit.     BP 118/74 (BP Location: Left Arm, Patient Position: Sitting, Cuff Size: Normal)   Temp 98.6 F (37 C) (Oral)   Ht 6' (1.829 m)   Wt 213 lb 1.6 oz (96.7 kg)   BMI 28.90 kg/m       Objective:   Physical Exam  Constitutional: He is oriented to person, place, and time. He appears well-developed and well-nourished. No distress.  HENT:  Head: Normocephalic and atraumatic.  Right Ear: External ear normal.  Left Ear: External ear normal.  Nose: Nose normal.  Mouth/Throat: Oropharynx is clear and moist. No oropharyngeal exudate.  Eyes: Conjunctivae and EOM are normal. Pupils are equal, round, and reactive to light. Right eye exhibits no discharge. Left eye exhibits no discharge.  Neck: Normal range of motion. Neck supple. No JVD present. Carotid bruit is not present. No tracheal deviation present. No thyromegaly present.  Cardiovascular: Normal rate, regular rhythm, normal heart sounds and intact distal pulses.  Exam reveals no gallop and no friction rub.   No murmur heard. Pulmonary/Chest:  Effort normal and breath sounds normal. No stridor. No respiratory distress. He has no wheezes. He has no rales. He exhibits no tenderness.  Abdominal: Soft. Bowel sounds are normal. He exhibits no distension and no mass. There is no tenderness. There is no rebound and no guarding.  Genitourinary: Rectum normal and prostate normal.  Musculoskeletal: Normal range of motion. He exhibits no edema, tenderness or deformity.  Lymphadenopathy:    He has no cervical adenopathy.  Neurological: He is alert and oriented to person, place, and time. He has normal reflexes. He displays normal reflexes. No cranial nerve deficit. Coordination normal.  Skin: Skin is warm and dry. No rash noted. He is not diaphoretic. No erythema. No pallor.  Psychiatric: He has a normal mood and affect. Judgment and thought content normal.  Nursing note and vitals reviewed.     Assessment & Plan:  1. Routine general medical examination at a health care facility  - Basic metabolic panel - CBC with Differential/Platelet - Hepatic function panel - Lipid panel - TSH - PSA - Educated on the importance of healthy eating and regular exercise.  - Follow up in one year  2. Hyperlipidemia, unspecified hyperlipidemia type - Basic metabolic panel - CBC with Differential/Platelet - Hepatic function panel - Lipid panel - TSH - PSA - Consider increasing statin dose 3. Essential hypertension - Well controlled. No change in medication  - Basic metabolic panel - CBC with Differential/Platelet - Hepatic function panel - Lipid panel - TSH - PSA  Dorothyann Peng, NP

## 2017-02-15 ENCOUNTER — Other Ambulatory Visit: Payer: Self-pay | Admitting: Family Medicine

## 2017-02-19 ENCOUNTER — Ambulatory Visit (INDEPENDENT_AMBULATORY_CARE_PROVIDER_SITE_OTHER): Payer: 59 | Admitting: Adult Health

## 2017-02-19 ENCOUNTER — Encounter: Payer: Self-pay | Admitting: Adult Health

## 2017-02-19 VITALS — BP 110/70 | Temp 98.1°F | Ht 72.0 in | Wt 209.3 lb

## 2017-02-19 DIAGNOSIS — M7711 Lateral epicondylitis, right elbow: Secondary | ICD-10-CM | POA: Diagnosis not present

## 2017-02-19 MED ORDER — METHYLPREDNISOLONE 4 MG PO TBPK: ORAL_TABLET | ORAL | 0 refills | Status: DC

## 2017-02-19 NOTE — Progress Notes (Signed)
Subjective:    Patient ID: [name], male    DOB: Nov 28, 1965, 51 y.o.   MRN: [id number]  HPI  51 year old male who  has a past medical history of ALLERGIC RHINITIS; ANXIETY; ASTHMA; BACK PAIN; ERECTILE DYSFUNCTION; GERD; HYPERLIPIDEMIA; HYPERTENSION; LUMBAR STRAIN; NECK PAIN; PANCREATITIS, HX OF; PONV (postoperative nausea and vomiting); Seasonal allergies; SHOULDER PAIN, LEFT; Substance abuse; and TENDINITIS, PATELLAR.   He presents to the office today for the complaint of right elbow pain, this has been an ongoing issue for the last two weeks. He denies any injury to his right elbow. The pain is described as dull.   He denies any bruising but has had trace swelling to the area.   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   He has been using his TENS unit and ice without resolution    Review of Systems See HPI   Past Medical History:  Diagnosis Date  . ALLERGIC RHINITIS   . ANXIETY   . ASTHMA   . BACK PAIN   . ERECTILE DYSFUNCTION   . GERD   . HYPERLIPIDEMIA   . HYPERTENSION   . LUMBAR STRAIN   . NECK PAIN   . PANCREATITIS, HX OF   . PONV (postoperative nausea and vomiting)    nausea only   . Seasonal allergies   . SHOULDER PAIN, LEFT   . Substance abuse    quit in 2007  . TENDINITIS, PATELLAR     Social History   Social History  . Marital status: Married    Spouse name: N/A  . Number of children: N/A  . Years of education: N/A   Occupational History  . Not on file.   Social History Main Topics  . Smoking status: Never Smoker  . Smokeless tobacco: Current User    Types: Chew     Comment: form given 11/24/15  . Alcohol use No     Comment: hx alcohol abuse  . Drug use: No  . Sexual activity: Not on file   Other Topics Concern  . Not on file   Social History Narrative   Works as a Games developer   Married to [employer]           Past Surgical History:  Procedure Laterality Date  . IMAGE GUIDED SINUS SURGERY N/A 04/13/2015   Procedure: IMAGE GUIDED SINUS SURGERY;  Surgeon: Carloyn Manner, MD;  Location: [facility];  Service: ENT;  Laterality: N/A;  GAVE DISK TO CE CE  . KNEE ARTHROSCOPY Right 2008  . KNEE SURGERY Left    x2  . MAXILLARY ANTROSTOMY Bilateral 04/13/2015   Procedure: MAXILLARY ANTROSTOMY;  Surgeon: Carloyn Manner, MD;  Location: [facility];  Service: ENT;  Laterality: Bilateral;    Family History  Problem Relation Age of Onset  . Lung disease Father   . Melanoma Mother   . Heart attack Brother   . Colon polyps Neg Hx   . Colon cancer Neg Hx   . Stomach cancer Neg Hx   . Pancreatic cancer Neg Hx   . Esophageal cancer Neg Hx     Allergies  Allergen Reactions  . Aspirin Nausea And Vomiting    Current Outpatient Prescriptions on File Prior to Visit  Medication Sig Dispense Refill  . buPROPion (WELLBUTRIN SR) 100 MG 12 hr tablet TAKE 2 TABLETS BY MOUTH EVERY MORNING 200 tablet 3  . cetirizine (ZYRTEC) 10 MG tablet Take 10 mg by mouth  daily as needed for allergies. AM    . Diclofenac Sodium (PENNSAID) 2 % SOLN Place 2 puffs onto the skin 2 (two) times daily as needed.    Marland Kitchen esomeprazole (NEXIUM) 20 MG packet Take 20 mg by mouth daily before breakfast.    . fluticasone (FLONASE) 50 MCG/ACT nasal spray USE 1 SPRAY IN EACH NOSTRIL ONCE A DAY 32 g 5  . losartan (COZAAR) 50 MG tablet TAKE 1 TABLET EVERY DAY 90 tablet 3  . methocarbamol (ROBAXIN) 500 MG tablet Take 1 tablet (500 mg total) by mouth 2 (two) times daily as needed for muscle spasms. 60 tablet 1  . simvastatin (ZOCOR) 10 MG tablet TAKE 1 TABLET BY MOUTH AT BEDTIME 100 tablet 2  . traMADol (ULTRAM) 50 MG tablet Take 1 tablet (50 mg total) by mouth 3 (three) times daily as needed. 90 tablet 0   No current facility-administered medications on file prior to visit.     BP 110/70 (BP Location: Left Arm, Patient Position: Sitting, Cuff Size: Normal)   Temp 98.1 F (36.7 C) (Oral)   Ht 6' (1.829 m)   Wt 209 lb 4.8 oz  (94.9 kg)   BMI 28.39 kg/m       Objective:   Physical Exam  Constitutional: He is oriented to person, place, and time. He appears well-developed and well-nourished. No distress.  Cardiovascular: Normal rate, regular rhythm, normal heart sounds and intact distal pulses.  Exam reveals no gallop and no friction rub.   No murmur heard. Pulmonary/Chest: Effort normal and breath sounds normal. No respiratory distress. He has no wheezes. He has no rales. He exhibits no tenderness.  Musculoskeletal: Normal range of motion. He exhibits tenderness (Tenderness to right myotendinous junction). He exhibits no deformity.  Neurological: He is alert and oriented to person, place, and time.  Skin: He is not diaphoretic.  Nursing note and vitals reviewed.     Assessment & Plan:  1. Lateral epicondylitis of right elbow - Get compression sleeve.  - Motrin 600mg  Q8H PRN  - Ice - methylPREDNISolone (MEDROL DOSEPAK) 4 MG TBPK tablet; Take as directed  Dispense: 21 tablet; Refill: 0 - Rest - Consider PT if not resolved  Dorothyann Peng, NP

## 2017-03-19 ENCOUNTER — Ambulatory Visit (INDEPENDENT_AMBULATORY_CARE_PROVIDER_SITE_OTHER): Payer: 59 | Admitting: Adult Health

## 2017-03-19 ENCOUNTER — Encounter: Payer: Self-pay | Admitting: Adult Health

## 2017-03-19 VITALS — BP 120/82 | HR 88 | Temp 98.2°F | Ht 72.0 in | Wt 208.8 lb

## 2017-03-19 DIAGNOSIS — R1013 Epigastric pain: Secondary | ICD-10-CM

## 2017-03-19 MED ORDER — PREDNISONE 20 MG PO TABS: 20.0000 mg | ORAL_TABLET | Freq: Every day | ORAL | 0 refills | Status: DC

## 2017-03-19 MED ORDER — OMEPRAZOLE 20 MG PO CPDR: 20.0000 mg | DELAYED_RELEASE_CAPSULE | Freq: Every day | ORAL | 3 refills | Status: DC

## 2017-03-19 NOTE — Progress Notes (Signed)
Subjective:    Patient ID: [name], male    DOB: 26-May-1966, 51 y.o.   MRN: [id number]  HPI  51 year old male who  has a past medical history of ALLERGIC RHINITIS; ANXIETY; ASTHMA; BACK PAIN; ERECTILE DYSFUNCTION; GERD; HYPERLIPIDEMIA; HYPERTENSION; LUMBAR STRAIN; NECK PAIN; PANCREATITIS, HX OF; PONV (postoperative nausea and vomiting); Seasonal allergies; SHOULDER PAIN, LEFT; Substance abuse; and TENDINITIS, PATELLAR.  He presents to the office today for the acute complaint of abdominal pain. He reports that he was at the beach three weeks ago and he had "severe sharp pain" below the left breast. He denies any radiating pain at this time. He went in and laid down that the pain subsided. Since that time he has had a burning pain in his epigastric area. He has noticed that the pain becomes worse after eating spicy foods. Associated symptoms include that of nausea.   He is taking nexium 20 mg daily.   Denies any chest pain, diarrhea, or vomiting.   He has a history of pancreatitis numerous years ago and stopped drinking alcohol at that time. He would like to make sure he is not having a flare.   Review of Systems See HPI   Past Medical History:  Diagnosis Date  . ALLERGIC RHINITIS   . ANXIETY   . ASTHMA   . BACK PAIN   . ERECTILE DYSFUNCTION   . GERD   . HYPERLIPIDEMIA   . HYPERTENSION   . LUMBAR STRAIN   . NECK PAIN   . PANCREATITIS, HX OF   . PONV (postoperative nausea and vomiting)    nausea only   . Seasonal allergies   . SHOULDER PAIN, LEFT   . Substance abuse    quit in 2007  . TENDINITIS, PATELLAR     Social History   Social History  . Marital status: Married    Spouse name: N/A  . Number of children: N/A  . Years of education: N/A   Occupational History  . Not on file.   Social History Main Topics  . Smoking status: Never Smoker  . Smokeless tobacco: Current User    Types: Chew     Comment: form given 11/24/15  . Alcohol use No     Comment: hx  alcohol abuse  . Drug use: No  . Sexual activity: Not on file   Other Topics Concern  . Not on file   Social History Narrative   Works as a Games developer   Married to [employer]           Past Surgical History:  Procedure Laterality Date  . IMAGE GUIDED SINUS SURGERY N/A 04/13/2015   Procedure: IMAGE GUIDED SINUS SURGERY;  Surgeon: Carloyn Manner, MD;  Location: [facility];  Service: ENT;  Laterality: N/A;  GAVE DISK TO CE CE  . KNEE ARTHROSCOPY Right 2008  . KNEE SURGERY Left    x2  . MAXILLARY ANTROSTOMY Bilateral 04/13/2015   Procedure: MAXILLARY ANTROSTOMY;  Surgeon: Carloyn Manner, MD;  Location: [facility];  Service: ENT;  Laterality: Bilateral;    Family History  Problem Relation Age of Onset  . Lung disease Father   . Melanoma Mother   . Heart attack Brother   . Colon polyps Neg Hx   . Colon cancer Neg Hx   . Stomach cancer Neg Hx   . Pancreatic cancer Neg Hx   . Esophageal cancer Neg Hx     Allergies  Allergen Reactions  .  Aspirin Nausea And Vomiting    Current Outpatient Prescriptions on File Prior to Visit  Medication Sig Dispense Refill  . buPROPion (WELLBUTRIN SR) 100 MG 12 hr tablet TAKE 2 TABLETS BY MOUTH EVERY MORNING 200 tablet 3  . cetirizine (ZYRTEC) 10 MG tablet Take 10 mg by mouth daily as needed for allergies. AM    . Diclofenac Sodium (PENNSAID) 2 % SOLN Place 2 puffs onto the skin 2 (two) times daily as needed.    . fluticasone (FLONASE) 50 MCG/ACT nasal spray USE 1 SPRAY IN EACH NOSTRIL ONCE A DAY 32 g 5  . losartan (COZAAR) 50 MG tablet TAKE 1 TABLET EVERY DAY 90 tablet 3  . methocarbamol (ROBAXIN) 500 MG tablet Take 1 tablet (500 mg total) by mouth 2 (two) times daily as needed for muscle spasms. 60 tablet 1  . simvastatin (ZOCOR) 10 MG tablet TAKE 1 TABLET BY MOUTH AT BEDTIME 100 tablet 2  . traMADol (ULTRAM) 50 MG tablet Take 1 tablet (50 mg total) by mouth 3 (three) times daily as needed. 90 tablet 0   No current  facility-administered medications on file prior to visit.     BP 120/82   Pulse 88   Temp 98.2 F (36.8 C) (Oral)   Ht 6' (1.829 m)   Wt 208 lb 12.8 oz (94.7 kg)   SpO2 97%   BMI 28.32 kg/m       Objective:   Physical Exam  Constitutional: He is oriented to person, place, and time. He appears well-developed and well-nourished. No distress.  Cardiovascular: Normal rate, regular rhythm, normal heart sounds and intact distal pulses.  Exam reveals no gallop and no friction rub.   No murmur heard. Pulmonary/Chest: Effort normal and breath sounds normal. No respiratory distress. He has no wheezes. He has no rales. He exhibits no tenderness.  Abdominal: Soft. Bowel sounds are normal. He exhibits no distension and no mass. There is no tenderness. There is no rebound and no guarding.  Neurological: He is alert and oriented to person, place, and time.  Skin: Skin is warm and dry. No rash noted. He is not diaphoretic. No erythema. No pallor.  Psychiatric: He has a normal mood and affect. His behavior is normal. Judgment and thought content normal.  Nursing note and vitals reviewed.     Assessment & Plan:  1. Epigastric pain - More consistent with GERD than pancreatitis. Patient would still like labs drawn. Will have him wean off Nexium and start Prilosec to see if symptoms resolve. Educated on diet to help with GERD symptoms - Lipase - Comprehensive metabolic panel - CBC with Differential/Platelet - omeprazole (PRILOSEC) 20 MG capsule; Take 1 capsule (20 mg total) by mouth daily.  Dispense: 30 capsule; Refill: 3 - Follow up if no improvement   Dorothyann Peng, NP

## 2017-03-20 LAB — CBC WITH DIFFERENTIAL/PLATELET
BASOS ABS: 0 10*3/uL (ref 0.0–0.1)
Basophils Relative: 0.5 % (ref 0.0–3.0)
EOS PCT: 4.4 % (ref 0.0–5.0)
Eosinophils Absolute: 0.2 10*3/uL (ref 0.0–0.7)
HEMATOCRIT: 42.8 % (ref 39.0–52.0)
HEMOGLOBIN: 15.1 g/dL (ref 13.0–17.0)
LYMPHS ABS: 1.7 10*3/uL (ref 0.7–4.0)
LYMPHS PCT: 35.5 % (ref 12.0–46.0)
MCHC: 35.3 g/dL (ref 30.0–36.0)
MCV: 85.4 fl (ref 78.0–100.0)
MONOS PCT: 6.5 % (ref 3.0–12.0)
Monocytes Absolute: 0.3 10*3/uL (ref 0.1–1.0)
Neutro Abs: 2.6 10*3/uL (ref 1.4–7.7)
Neutrophils Relative %: 53.1 % (ref 43.0–77.0)
Platelets: 166 10*3/uL (ref 150.0–400.0)
RBC: 5.01 Mil/uL (ref 4.22–5.81)
RDW: 12.8 % (ref 11.5–15.5)
WBC: 4.8 10*3/uL (ref 4.0–10.5)

## 2017-03-20 LAB — COMPREHENSIVE METABOLIC PANEL
ALK PHOS: 57 U/L (ref 39–117)
ALT: 33 U/L (ref 0–53)
AST: 21 U/L (ref 0–37)
Albumin: 4.8 g/dL (ref 3.5–5.2)
BUN: 25 mg/dL — ABNORMAL HIGH (ref 6–23)
CALCIUM: 9.7 mg/dL (ref 8.4–10.5)
CO2: 26 mEq/L (ref 19–32)
Chloride: 105 mEq/L (ref 96–112)
Creatinine, Ser: 0.96 mg/dL (ref 0.40–1.50)
GFR: 87.77 mL/min (ref >60.00)
GLUCOSE: 114 mg/dL — AB (ref 70–99)
POTASSIUM: 4 meq/L (ref 3.5–5.1)
Sodium: 140 mEq/L (ref 135–145)
TOTAL PROTEIN: 6.7 g/dL (ref 6.0–8.3)
Total Bilirubin: 0.7 mg/dL (ref 0.2–1.2)

## 2017-03-20 LAB — LIPASE: LIPASE: 11 U/L (ref 11.0–59.0)

## 2017-04-16 DIAGNOSIS — M7711 Lateral epicondylitis, right elbow: Secondary | ICD-10-CM | POA: Diagnosis not present

## 2017-05-15 ENCOUNTER — Other Ambulatory Visit: Payer: Self-pay | Admitting: Family Medicine

## 2017-05-27 ENCOUNTER — Emergency Department (HOSPITAL_COMMUNITY): Payer: Worker's Compensation

## 2017-05-27 ENCOUNTER — Encounter (HOSPITAL_COMMUNITY): Payer: Self-pay

## 2017-05-27 ENCOUNTER — Emergency Department (HOSPITAL_COMMUNITY)
Admission: EM | Admit: 2017-05-27 | Discharge: 2017-05-27 | Disposition: A | Discharge status: Home / Self Care | Payer: Worker's Compensation | Attending: Emergency Medicine | Admitting: Emergency Medicine

## 2017-05-27 DIAGNOSIS — Y939 Activity, unspecified: Secondary | ICD-10-CM | POA: Diagnosis not present

## 2017-05-27 DIAGNOSIS — Y990 Civilian activity done for income or pay: Secondary | ICD-10-CM | POA: Diagnosis not present

## 2017-05-27 DIAGNOSIS — Z23 Encounter for immunization: Secondary | ICD-10-CM | POA: Insufficient documentation

## 2017-05-27 DIAGNOSIS — S66322A Laceration of extensor muscle, fascia and tendon of right middle finger at wrist and hand level, initial encounter: Secondary | ICD-10-CM | POA: Diagnosis not present

## 2017-05-27 DIAGNOSIS — F17220 Nicotine dependence, chewing tobacco, uncomplicated: Secondary | ICD-10-CM | POA: Insufficient documentation

## 2017-05-27 DIAGNOSIS — Y9289 Other specified places as the place of occurrence of the external cause: Secondary | ICD-10-CM | POA: Diagnosis not present

## 2017-05-27 DIAGNOSIS — S61212A Laceration without foreign body of right middle finger without damage to nail, initial encounter: Secondary | ICD-10-CM

## 2017-05-27 DIAGNOSIS — W228XXA Striking against or struck by other objects, initial encounter: Secondary | ICD-10-CM | POA: Insufficient documentation

## 2017-05-27 DIAGNOSIS — J45909 Unspecified asthma, uncomplicated: Secondary | ICD-10-CM | POA: Insufficient documentation

## 2017-05-27 DIAGNOSIS — S6991XA Unspecified injury of right wrist, hand and finger(s), initial encounter: Secondary | ICD-10-CM | POA: Diagnosis present

## 2017-05-27 DIAGNOSIS — S61209A Unspecified open wound of unspecified finger without damage to nail, initial encounter: Secondary | ICD-10-CM

## 2017-05-27 DIAGNOSIS — Z79899 Other long term (current) drug therapy: Secondary | ICD-10-CM | POA: Insufficient documentation

## 2017-05-27 DIAGNOSIS — I10 Essential (primary) hypertension: Secondary | ICD-10-CM | POA: Insufficient documentation

## 2017-05-27 DIAGNOSIS — S56429A Laceration of extensor muscle, fascia and tendon of unspecified finger at forearm level, initial encounter: Secondary | ICD-10-CM

## 2017-05-27 MED ORDER — BUPIVACAINE HCL 0.5 % IJ SOLN
10.0000 mL | Freq: Once | INTRAMUSCULAR | Status: AC
  Administered 2017-05-27: 10 mL
  Filled 2017-05-27: qty 10

## 2017-05-27 MED ORDER — TETANUS-DIPHTH-ACELL PERTUSSIS 5-2.5-18.5 LF-MCG/0.5 IM SUSP
0.5000 mL | Freq: Once | INTRAMUSCULAR | Status: AC
  Administered 2017-05-27: 0.5 mL via INTRAMUSCULAR
  Filled 2017-05-27: qty 0.5

## 2017-05-27 NOTE — ED Notes (Signed)
ED Provider at bedside. 

## 2017-05-27 NOTE — ED Notes (Signed)
Notified ortho tech of orders.  

## 2017-05-27 NOTE — ED Triage Notes (Signed)
Patient states he was screwing into a stainless steel cabinet and the screw driver slipped hitting his right middle finger and he is unable to move the right middle finger.

## 2017-05-27 NOTE — Discharge Instructions (Signed)
Ibuprofen or tylenol for pain. Ice and elevate your hand. Call and see Dr. Lenon Curt in his office today. Return if any problems.

## 2017-05-27 NOTE — ED Provider Notes (Signed)
[facility] DEPT Provider Note   CSN: [id number] Arrival date & time: 05/27/17  0901     History   Chief Complaint Chief Complaint  Patient presents with  . Finger Injury    HPI [name] is a 51 y.o. male.  HPI [name] is a 51 y.o. malepresents to emergency department complaining of right hand injury. Pt was at work and lacerated dorsal of the right hand on sharp piece of metal. Reports bleeding controlled with pressure. Denies numbness sensation to the finger. Reports weakness with movement of the 3rd finger. No prior hand injuries. Pt is right handed. Tetanus unknown. States pain is worsened with any movement of the hand.   Past Medical History:  Diagnosis Date  . ALLERGIC RHINITIS   . ANXIETY   . ASTHMA   . BACK PAIN   . ERECTILE DYSFUNCTION   . GERD   . HYPERLIPIDEMIA   . HYPERTENSION   . LUMBAR STRAIN   . NECK PAIN   . PANCREATITIS, HX OF   . PONV (postoperative nausea and vomiting)    nausea only   . Seasonal allergies   . SHOULDER PAIN, LEFT   . Substance abuse    quit in 2007  . TENDINITIS, PATELLAR     Patient Active Problem List   Diagnosis Date Noted  . Degenerative joint disease of knee, 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  . Elevated fasting blood sugar 10/06/2014  . Reflux esophagitis 04/22/2012  . Nicotine dependence 04/22/2012  . HYPOGONADISM 09/19/2010  . Hyperlipidemia 01/17/2010  . ASTHMA 10/25/2008  . ERECTILE DYSFUNCTION 10/08/2008  . Essential hypertension 10/16/2007  . Allergic rhinitis 05/13/2007    Past Surgical History:  Procedure Laterality Date  . IMAGE GUIDED SINUS SURGERY N/A 04/13/2015   Procedure: IMAGE GUIDED SINUS SURGERY;  Surgeon: Carloyn Manner, MD;  Location: [facility];  Service: ENT;  Laterality: N/A;  GAVE DISK TO CE CE  . KNEE ARTHROSCOPY Right 2008  . KNEE SURGERY Left    x2  . MAXILLARY ANTROSTOMY Bilateral 04/13/2015   Procedure: MAXILLARY ANTROSTOMY;  Surgeon: Carloyn Manner, MD;  Location: [facility];  Service: ENT;  Laterality: Bilateral;       Home Medications    Prior to Admission medications   Medication Sig Start Date End Date Taking? Authorizing Provider  buPROPion [facility] SR) 100 MG 12 hr tablet TAKE 2 TABLETS BY MOUTH EVERY MORNING 05/15/17  Yes Dorena Cookey, MD  cetirizine (ZYRTEC) 10 MG tablet Take 10 mg by mouth daily as needed for allergies. AM   Yes [provider]  Diclofenac Sodium (PENNSAID) 2 % SOLN Place 2 puffs onto the skin 2 (two) times daily as needed.   Yes [provider]  fluticasone (FLONASE) 50 MCG/ACT nasal spray USE 1 SPRAY IN EACH NOSTRIL ONCE A DAY 02/15/17  Yes Nafziger, Tommi Rumps, NP  losartan (COZAAR) 50 MG tablet TAKE 1 TABLET EVERY DAY 11/09/16  Yes Nafziger, Tommi Rumps, NP  meloxicam (MOBIC) 15 MG tablet Take 15 mg by mouth daily. 05/12/17  Yes [provider]  omeprazole (PRILOSEC) 20 MG capsule Take 1 capsule (20 mg total) by mouth daily. 03/19/17  Yes Nafziger, Tommi Rumps, NP  simvastatin (ZOCOR) 10 MG tablet TAKE 1 TABLET BY MOUTH AT BEDTIME 09/12/16  Yes Nafziger, Tommi Rumps, NP  traMADol (ULTRAM) 50 MG tablet Take 1 tablet (50 mg total) by mouth 3 (three) times daily as needed. 01/17/17  Yes Nafziger, Tommi Rumps, NP  methocarbamol (ROBAXIN) 500 MG tablet Take 1  tablet (500 mg total) by mouth 2 (two) times daily as needed for muscle spasms. Patient not taking: Reported on 05/27/2017 12/21/16   Jamse Arn, MD  predniSONE (DELTASONE) 20 MG tablet Take 1 tablet (20 mg total) by mouth daily with breakfast. Patient not taking: Reported on 05/27/2017 03/19/17   Dorothyann Peng, NP    Family History Family History  Problem Relation Age of Onset  . Lung disease Father   . Melanoma Mother   . Heart attack Brother   . Colon polyps Neg Hx   . Colon cancer Neg Hx   . Stomach cancer Neg Hx   . Pancreatic cancer Neg Hx   . Esophageal cancer Neg Hx     Social History Social History  Substance Use Topics  . Smoking status: Never Smoker    . Smokeless tobacco: Current User    Types: Chew     Comment: form given 11/24/15  . Alcohol use No     Comment: hx alcohol abuse     Allergies   Aspirin   Review of Systems Review of Systems  Constitutional: Negative for chills and fever.  Respiratory: Negative for cough, chest tightness and shortness of breath.   Cardiovascular: Negative for chest pain, palpitations and leg swelling.  Musculoskeletal: Positive for arthralgias. Negative for myalgias, neck pain and neck stiffness.  Skin: Positive for wound. Negative for rash.  Allergic/Immunologic: Negative for immunocompromised state.  Neurological: Positive for weakness. Negative for numbness.  All other systems reviewed and are negative.    Physical Exam Updated Vital Signs BP (!) 143/91   Pulse 98   Temp 98.3 F (36.8 C) (Oral)   Resp 15   Ht 6\' 3"  (1.905 m)   Wt 95.3 kg (210 lb)   SpO2 100%   BMI 26.25 kg/m   Physical Exam  Constitutional: He appears well-developed and well-nourished. No distress.  Eyes: Conjunctivae are normal.  Neck: Neck supple.  Cardiovascular: Normal rate.   Pulmonary/Chest: No respiratory distress.  Abdominal: He exhibits no distension.  Musculoskeletal:  Irregular 4cm laceration to the right dorsal mcp joint of the middle finger. Middle fingers pink, capillary refill less than 2 seconds distally. Sensation is intact. Full range of motion with flexion of the finger at MCP, PIP, DIP joints. Patient unable to extend middle finger at MCP, PIP, DIP joints.  Skin: Skin is warm and dry.  Nursing note and vitals reviewed.    ED Treatments / Results  Labs (all labs ordered are listed, but only abnormal results are displayed) Labs Reviewed - No data to display  EKG  EKG Interpretation None       Radiology Dg Hand Complete Right  Result Date: 05/27/2017 CLINICAL DATA:  Laceration to dorsal 3rd digit at MCP joint EXAM: RIGHT HAND - COMPLETE 3+ VIEW COMPARISON:  None. FINDINGS: No  fracture or dislocation is seen. The joint spaces are preserved. Mild dorsal soft tissue swelling overlying the MCP joints. No radiopaque foreign body is seen. IMPRESSION: Mild dorsal soft tissue swelling overlying the MCP joints. No fracture, dislocation, or radiopaque foreign body is seen. Electronically Signed   By: Julian Hy M.D.   On: 05/27/2017 10:48    Procedures Procedures (including critical care time)  LACERATION REPAIR Performed by: Jeannett Senior A Authorized by: Jeannett Senior A Consent: Verbal consent obtained. Risks and benefits: risks, benefits and alternatives were discussed Consent given by: patient Patient identity confirmed: provided demographic data Prepped and Draped in normal sterile fashion Wound explored  Laceration Location:  Right hand  Laceration Length: 4cm  No Foreign Bodies seen or palpated  Anesthesia: local infiltration  Local anesthetic: bupivacaine 0.5% wo epinephrine  Anesthetic total: 3 ml  Irrigation method: syringe Amount of cleaning: standard  Skin closure: prolene 4.0  Number of sutures: 4  Technique: simple interrupted, loose approximation of the skin flap  Patient tolerance: Patient tolerated the procedure well with no immediate complications.   Medications Ordered in ED Medications  bupivacaine (MARCAINE) 0.5 % (with pres) injection 10 mL (10 mLs Infiltration Given by Other 05/27/17 1010)  Tdap (BOOSTRIX) injection 0.5 mL (0.5 mLs Intramuscular Given 05/27/17 0946)     Initial Impression / Assessment and Plan / ED Course  I have reviewed the triage vital signs and the nursing notes.  Pertinent labs & imaging results that were available during my care of the patient were reviewed by me and considered in my medical decision making (see chart for details).     Patient with laceration to the right hand. Positive for tendon involvement. Patient unable to extend the middle finger MCP joint. Wound irrigated  with 1 L of saline. Explored, positive for tendon injury. X-rays negative. Discussed with Dr. Lenon Curt, will see patient in the office today. Asked to close the wound with sutures. Tetanus updated. Discussed plan with patient who agreed.   Vitals:   05/27/17 0907 05/27/17 0920  BP: (!) 143/91   Pulse: 98   Resp: 15   Temp: 98.3 F (36.8 C)   TempSrc: Oral   SpO2: 100%   Weight:  95.3 kg (210 lb)  Height:  6\' 3"  (1.905 m)     Final Clinical Impressions(s) / ED Diagnoses   Final diagnoses:  Laceration of right middle finger without foreign body without damage to nail, initial encounter  Extensor tendon laceration of finger with open wound, initial encounter    New Prescriptions Discharge Medication List as of 05/27/2017 12:02 PM       Jeannett Senior, PA-C 05/27/17 1529    Lacretia Leigh, MD 05/29/17 1116

## 2017-05-31 ENCOUNTER — Encounter: Payer: Self-pay | Admitting: Adult Health

## 2017-06-05 DIAGNOSIS — Z23 Encounter for immunization: Secondary | ICD-10-CM | POA: Diagnosis not present

## 2017-06-11 ENCOUNTER — Ambulatory Visit (INDEPENDENT_AMBULATORY_CARE_PROVIDER_SITE_OTHER): Payer: 59 | Admitting: Adult Health

## 2017-06-11 ENCOUNTER — Encounter: Payer: Self-pay | Admitting: Adult Health

## 2017-06-11 VITALS — BP 110/76 | Temp 98.3°F | Wt 208.0 lb

## 2017-06-11 DIAGNOSIS — J0110 Acute frontal sinusitis, unspecified: Secondary | ICD-10-CM

## 2017-06-11 MED ORDER — DOXYCYCLINE HYCLATE 100 MG PO CAPS: 100.0000 mg | ORAL_CAPSULE | Freq: Two times a day (BID) | ORAL | 0 refills | Status: DC

## 2017-06-11 NOTE — Progress Notes (Signed)
Subjective:    Patient ID: [name], male    DOB: 1966-02-12, 51 y.o.   MRN: [id number]  URI   This is a new problem. The current episode started in the past 7 days. There has been no fever. Associated symptoms include coughing, ear pain, headaches, rhinorrhea, sinus pain, a sore throat and swollen glands. Pertinent negatives include no plugged ear sensation. Treatments tried: Takes Zyrtec and flonase daily     Review of Systems  Constitutional: Positive for fatigue.  HENT: Positive for ear pain, postnasal drip, rhinorrhea, sinus pain, sinus pressure and sore throat. Negative for ear discharge.   Respiratory: Positive for cough.   Cardiovascular: Negative.   Neurological: Positive for headaches.   Past Medical History:  Diagnosis Date  . ALLERGIC RHINITIS   . ANXIETY   . ASTHMA   . BACK PAIN   . ERECTILE DYSFUNCTION   . GERD   . HYPERLIPIDEMIA   . HYPERTENSION   . LUMBAR STRAIN   . NECK PAIN   . PANCREATITIS, HX OF   . PONV (postoperative nausea and vomiting)    nausea only   . Seasonal allergies   . SHOULDER PAIN, LEFT   . Substance abuse ([place])    quit in 2007  . TENDINITIS, PATELLAR     Social History   Social History  . Marital status: Married    Spouse name: N/A  . Number of children: N/A  . Years of education: N/A   Occupational History  . Not on file.   Social History Main Topics  . Smoking status: Never Smoker  . Smokeless tobacco: Current User    Types: Chew     Comment: form given 11/24/15  . Alcohol use No     Comment: hx alcohol abuse  . Drug use: No  . Sexual activity: Not on file   Other Topics Concern  . Not on file   Social History Narrative   Works as a Games developer   Married to [employer]           Past Surgical History:  Procedure Laterality Date  . IMAGE GUIDED SINUS SURGERY N/A 04/13/2015   Procedure: IMAGE GUIDED SINUS SURGERY;  Surgeon: Carloyn Manner, MD;  Location: [facility];  Service: ENT;  Laterality: N/A;   GAVE DISK TO CE CE  . KNEE ARTHROSCOPY Right 2008  . KNEE SURGERY Left    x2  . MAXILLARY ANTROSTOMY Bilateral 04/13/2015   Procedure: MAXILLARY ANTROSTOMY;  Surgeon: Carloyn Manner, MD;  Location: [facility];  Service: ENT;  Laterality: Bilateral;    Family History  Problem Relation Age of Onset  . Lung disease Father   . Melanoma Mother   . Heart attack Brother   . Colon polyps Neg Hx   . Colon cancer Neg Hx   . Stomach cancer Neg Hx   . Pancreatic cancer Neg Hx   . Esophageal cancer Neg Hx     Allergies  Allergen Reactions  . Aspirin Nausea And Vomiting    Current Outpatient Prescriptions on File Prior to Visit  Medication Sig Dispense Refill  . buPROPion (WELLBUTRIN SR) 100 MG 12 hr tablet TAKE 2 TABLETS BY MOUTH EVERY MORNING 200 tablet 2  . cetirizine (ZYRTEC) 10 MG tablet Take 10 mg by mouth daily as needed for allergies. AM    . fluticasone (FLONASE) 50 MCG/ACT nasal spray USE 1 SPRAY IN EACH NOSTRIL ONCE A DAY 32 g 5  . losartan (COZAAR) 50  MG tablet TAKE 1 TABLET EVERY DAY 90 tablet 3  . meloxicam (MOBIC) 15 MG tablet Take 15 mg by mouth daily.    Marland Kitchen omeprazole (PRILOSEC) 20 MG capsule Take 1 capsule (20 mg total) by mouth daily. 30 capsule 3  . simvastatin (ZOCOR) 10 MG tablet TAKE 1 TABLET BY MOUTH AT BEDTIME 100 tablet 2  . traMADol (ULTRAM) 50 MG tablet Take 1 tablet (50 mg total) by mouth 3 (three) times daily as needed. 90 tablet 0  . Diclofenac Sodium (PENNSAID) 2 % SOLN Place 2 puffs onto the skin 2 (two) times daily as needed.     No current facility-administered medications on file prior to visit.     BP 110/76 (BP Location: Left Arm)   Temp 98.3 F (36.8 C) (Oral)   Wt 208 lb (94.3 kg)   BMI 26.00 kg/m       Objective:   Physical Exam  Constitutional: He is oriented to person, place, and time. He appears well-developed and well-nourished. No distress.  HENT:  Head: Normocephalic and atraumatic.  Right Ear: Hearing, tympanic membrane,  external ear and ear canal normal.  Left Ear: Hearing, tympanic membrane and external ear normal.  Nose: Mucosal edema and rhinorrhea present. Right sinus exhibits frontal sinus tenderness. Right sinus exhibits no maxillary sinus tenderness. Left sinus exhibits frontal sinus tenderness. Left sinus exhibits no maxillary sinus tenderness.  Mouth/Throat: Uvula is midline, oropharynx is clear and moist and mucous membranes are normal. No oropharyngeal exudate.  Eyes: Pupils are equal, round, and reactive to light. Conjunctivae and EOM are normal. Right eye exhibits no discharge. Left eye exhibits no discharge. No scleral icterus.  Cardiovascular: Normal rate, regular rhythm, normal heart sounds and intact distal pulses.  Exam reveals no gallop and no friction rub.   No murmur heard. Pulmonary/Chest: Effort normal and breath sounds normal. No respiratory distress. He has no wheezes. He has no rales. He exhibits no tenderness.  Neurological: He is alert and oriented to person, place, and time.  Skin: Skin is warm and dry. No rash noted. He is not diaphoretic. No erythema. No pallor.  Psychiatric: He has a normal mood and affect. His behavior is normal. Judgment and thought content normal.  Nursing note and vitals reviewed.     Assessment & Plan:  1. Acute non-recurrent frontal sinusitis - Will treat sinus infection due to symptoms. Follow up if no improvement in the next 2-3 days  - doxycycline (VIBRAMYCIN) 100 MG capsule; Take 1 capsule (100 mg total) by mouth 2 (two) times daily.  Dispense: 14 capsule; Refill: 0  Dorothyann Peng, NP

## 2017-06-13 ENCOUNTER — Other Ambulatory Visit: Payer: Self-pay | Admitting: Adult Health

## 2017-06-14 NOTE — Telephone Encounter (Signed)
Sent to the pharmacy by e-scribe.  Due for cpx 01/2018.

## 2017-07-07 ENCOUNTER — Other Ambulatory Visit: Payer: Self-pay | Admitting: Adult Health

## 2017-07-07 DIAGNOSIS — R1013 Epigastric pain: Secondary | ICD-10-CM

## 2017-07-08 ENCOUNTER — Other Ambulatory Visit: Payer: Self-pay | Admitting: Adult Health

## 2017-07-08 DIAGNOSIS — M25561 Pain in right knee: Principal | ICD-10-CM

## 2017-07-08 DIAGNOSIS — M25562 Pain in left knee: Principal | ICD-10-CM

## 2017-07-08 DIAGNOSIS — G8929 Other chronic pain: Secondary | ICD-10-CM

## 2017-07-09 NOTE — Telephone Encounter (Signed)
Sent to the pharmacy by e-scribe. 

## 2017-07-10 NOTE — Telephone Encounter (Signed)
Called to the pharmacy and left on machine. 

## 2017-07-10 NOTE — Telephone Encounter (Signed)
Last filled on 01/17/17 #90

## 2017-07-10 NOTE — Telephone Encounter (Signed)
Ok to refill for 30 days  

## 2017-08-12 ENCOUNTER — Other Ambulatory Visit: Payer: Self-pay | Admitting: Adult Health

## 2017-08-12 DIAGNOSIS — M25562 Pain in left knee: Principal | ICD-10-CM

## 2017-08-12 DIAGNOSIS — M25561 Pain in right knee: Principal | ICD-10-CM

## 2017-08-12 DIAGNOSIS — G8929 Other chronic pain: Secondary | ICD-10-CM

## 2017-08-13 NOTE — Telephone Encounter (Signed)
Ok to refill + 2  

## 2017-08-15 ENCOUNTER — Telehealth: Payer: Self-pay | Admitting: Adult Health

## 2017-08-15 NOTE — Telephone Encounter (Signed)
Copied from [place] [id number]. Topic: General - Other >> Aug 15, 2017  3:54 PM Darl Householder, RMA wrote: Reason for CRM: patient is calling because [facility] is requesting a prior auth for Tramadol 50 mg, please contact pt at [phone] or pharmacy

## 2017-08-16 ENCOUNTER — Encounter: Payer: Self-pay | Admitting: Adult Health

## 2017-08-16 NOTE — Telephone Encounter (Signed)
Duplicate.  See [place].

## 2017-08-16 NOTE — Telephone Encounter (Signed)
Called to the pharmacy and left on machine. 

## 2017-09-13 ENCOUNTER — Other Ambulatory Visit: Payer: Self-pay | Admitting: Adult Health

## 2017-09-13 MED ORDER — LOVASTATIN 20 MG PO TABS: 20.0000 mg | ORAL_TABLET | Freq: Every day | ORAL | 3 refills | Status: DC

## 2017-09-15 DIAGNOSIS — J069 Acute upper respiratory infection, unspecified: Secondary | ICD-10-CM | POA: Diagnosis not present

## 2017-10-02 DIAGNOSIS — J069 Acute upper respiratory infection, unspecified: Secondary | ICD-10-CM | POA: Diagnosis not present

## 2017-10-04 DIAGNOSIS — J019 Acute sinusitis, unspecified: Secondary | ICD-10-CM | POA: Diagnosis not present

## 2017-10-04 DIAGNOSIS — R05 Cough: Secondary | ICD-10-CM | POA: Diagnosis not present

## 2017-10-11 ENCOUNTER — Other Ambulatory Visit: Payer: Self-pay | Admitting: Adult Health

## 2017-10-14 NOTE — Telephone Encounter (Signed)
Pt is no longer taking this medication.  Message sent to the pharmacy to d/c.

## 2017-10-29 ENCOUNTER — Encounter: Payer: Self-pay | Admitting: Adult Health

## 2017-10-29 ENCOUNTER — Ambulatory Visit: Payer: 59 | Admitting: Adult Health

## 2017-10-29 ENCOUNTER — Ambulatory Visit (INDEPENDENT_AMBULATORY_CARE_PROVIDER_SITE_OTHER): Payer: 59

## 2017-10-29 VITALS — BP 126/82 | Temp 98.6°F | Wt 231.0 lb

## 2017-10-29 DIAGNOSIS — R0781 Pleurodynia: Secondary | ICD-10-CM | POA: Diagnosis not present

## 2017-10-29 DIAGNOSIS — M545 Low back pain, unspecified: Secondary | ICD-10-CM

## 2017-10-29 MED ORDER — CYCLOBENZAPRINE HCL 10 MG PO TABS: 10.0000 mg | ORAL_TABLET | Freq: Three times a day (TID) | ORAL | 0 refills | Status: DC | PRN

## 2017-10-29 NOTE — Progress Notes (Signed)
Subjective:    Patient ID: [name], male    DOB: 01/23/1966, 52 y.o.   MRN: [id number]  HPI  52 year old male who  has a past medical history of ALLERGIC RHINITIS, ANXIETY, ASTHMA, BACK PAIN, ERECTILE DYSFUNCTION, GERD, HYPERLIPIDEMIA, HYPERTENSION, LUMBAR STRAIN, NECK PAIN, PANCREATITIS, HX OF, PONV (postoperative nausea and vomiting), Seasonal allergies, SHOULDER PAIN, LEFT, Substance abuse ([place]), and TENDINITIS, PATELLAR. He presents to the office today for right sided back pain and left sided rib pain. He reports that two weeks ago he was lifting a heavy object over his head and felt that he pulled a muscle in his right lower back.  He has been treating at home with Advil and Aspercreme.  Has not been using ice or heat.  Denies much improvement with over-the-counter remedies.  Additionally 3 days ago while working he rolled towards his left and rolled over piece of the cabinet causing pain to the left lower rib cage, soon after that he was struck in the left lower rib cage right upper quadrant area with a piece of a wall that was being carried by a coworker.  Reports pain in the upper quadrant and right lower rib cage continues.  Denies any bruising to the area.  Does endorse pain with deep inspiration.  He does not have a cough  Review of Systems See HPI   Past Medical History:  Diagnosis Date  . ALLERGIC RHINITIS   . ANXIETY   . ASTHMA   . BACK PAIN   . ERECTILE DYSFUNCTION   . GERD   . HYPERLIPIDEMIA   . HYPERTENSION   . LUMBAR STRAIN   . NECK PAIN   . PANCREATITIS, HX OF   . PONV (postoperative nausea and vomiting)    nausea only   . Seasonal allergies   . SHOULDER PAIN, LEFT   . Substance abuse ([place])    quit in 2007  . TENDINITIS, PATELLAR     Social History   Socioeconomic History  . Marital status: Married    Spouse name: Not on file  . Number of children: Not on file  . Years of education: Not on file  . Highest education level: Not on file  Social Needs    . Financial resource strain: Not on file  . Food insecurity - worry: Not on file  . Food insecurity - inability: Not on file  . Transportation needs - medical: Not on file  . Transportation needs - non-medical: Not on file  Occupational History  . Not on file  Tobacco Use  . Smoking status: Never Smoker  . Smokeless tobacco: Current User    Types: Chew  . Tobacco comment: form given 11/24/15  Substance and Sexual Activity  . Alcohol use: No    Alcohol/week: 0.0 oz    Comment: hx alcohol abuse  . Drug use: No  . Sexual activity: Not on file  Other Topics Concern  . Not on file  Social History Narrative   Works as a Games developer   Married to [employer]        Past Surgical History:  Procedure Laterality Date  . IMAGE GUIDED SINUS SURGERY N/A 04/13/2015   Procedure: IMAGE GUIDED SINUS SURGERY;  Surgeon: Carloyn Manner, MD;  Location: [facility];  Service: ENT;  Laterality: N/A;  GAVE DISK TO CE CE  . KNEE ARTHROSCOPY Right 2008  . KNEE SURGERY Left    x2  . MAXILLARY ANTROSTOMY Bilateral 04/13/2015   Procedure:  MAXILLARY ANTROSTOMY;  Surgeon: Carloyn Manner, MD;  Location: [facility];  Service: ENT;  Laterality: Bilateral;    Family History  Problem Relation Age of Onset  . Lung disease Father   . Melanoma Mother   . Heart attack Brother   . Colon polyps Neg Hx   . Colon cancer Neg Hx   . Stomach cancer Neg Hx   . Pancreatic cancer Neg Hx   . Esophageal cancer Neg Hx     Allergies  Allergen Reactions  . Aspirin Nausea And Vomiting    Current Outpatient Medications on File Prior to Visit  Medication Sig Dispense Refill  . buPROPion (WELLBUTRIN SR) 100 MG 12 hr tablet TAKE 2 TABLETS BY MOUTH EVERY MORNING 200 tablet 2  . cetirizine (ZYRTEC) 10 MG tablet Take 10 mg by mouth daily as needed for allergies. AM    . Diclofenac Sodium (PENNSAID) 2 % SOLN Place 2 puffs onto the skin 2 (two) times daily as needed.    . fluticasone (FLONASE) 50 MCG/ACT  nasal spray USE 1 SPRAY IN EACH NOSTRIL ONCE A DAY 32 g 5  . losartan (COZAAR) 50 MG tablet TAKE 1 TABLET EVERY DAY 90 tablet 3  . lovastatin (MEVACOR) 20 MG tablet Take 1 tablet (20 mg total) by mouth at bedtime. 90 tablet 3  . omeprazole (PRILOSEC) 20 MG capsule TAKE 1 CAPSULE BY MOUTH EVERY DAY 90 capsule 1  . traMADol (ULTRAM) 50 MG tablet TAKE 1 TABLET BY MOUTH 3 TIMES A DAY AS NEEDED 90 tablet 2   No current facility-administered medications on file prior to visit.     BP 126/82   Temp 98.6 F (37 C) (Oral)   Wt 231 lb (104.8 kg)   BMI 28.87 kg/m       Objective:   Physical Exam  Constitutional: He is oriented to person, place, and time. He appears well-developed and well-nourished. No distress.  Cardiovascular: Normal rate, regular rhythm, normal heart sounds and intact distal pulses. Exam reveals no gallop and no friction rub.  No murmur heard. Pulmonary/Chest: Effort normal and breath sounds normal. No respiratory distress. He has no wheezes. He has no rales. He exhibits no tenderness.  Abdominal: Soft. Normal appearance and bowel sounds are normal. He exhibits no distension and no mass. There is no hepatosplenomegaly, splenomegaly or hepatomegaly. There is tenderness in the left upper quadrant. There is no rebound and no guarding. No hernia.    Tenderness over spleen. No bruising or signs of trauma. Tenderness to ribs 8-10 on left side   Musculoskeletal: Normal range of motion. He exhibits tenderness (right lower back pain). He exhibits no edema or deformity.  Neurological: He is alert and oriented to person, place, and time.  Skin: Skin is warm and dry. No rash noted. He is not diaphoretic. No erythema. No pallor.  Psychiatric: He has a normal mood and affect. His behavior is normal. Judgment and thought content normal.  Nursing note and vitals reviewed.     Assessment & Plan:  1. Rib pain - Possible bruise to rib cage. Not concerned about laceration or trauma to  spleen. Will r/o fracture of ribs.  - DG Chest 2 View; Future - cyclobenzaprine (FLEXERIL) 10 MG tablet; Take 1 tablet (10 mg total) by mouth 3 (three) times daily as needed for muscle spasms.  Dispense: 30 tablet; Refill: 0  2. Acute right-sided low back pain without sciatica - Appears to be muscular in nature - cyclobenzaprine (FLEXERIL) 10 MG  tablet; Take 1 tablet (10 mg total) by mouth 3 (three) times daily as needed for muscle spasms.  Dispense: 30 tablet; Refill: 0 - Advil and stretching   Dorothyann Peng, NP

## 2017-10-29 NOTE — Progress Notes (Signed)
Subjective:    Patient ID: [name], male    DOB: 1966-08-15, 52 y.o.   MRN: [id number]  HPI  2 weeks ago he strained his right lateral lower back lifting molding.  3 days ago he rolled over onto a piece of cabinetry on his left rib cage and then was hit in the same area of the ribcage with a wall they were moving.  He has tried OTC advil and has used his Tramadol three times a day with little relief.  He has also used Aspercreme to the lower back.   He is having some pain with deep inspiration.   Review of Systems  Constitutional: Negative.   HENT: Negative.   Respiratory: Negative.   Cardiovascular: Negative.   Gastrointestinal: Negative.   Genitourinary: Negative.   Musculoskeletal: Positive for back pain.  Hematological: Negative.     Past Medical History:  Diagnosis Date  . ALLERGIC RHINITIS   . ANXIETY   . ASTHMA   . BACK PAIN   . ERECTILE DYSFUNCTION   . GERD   . HYPERLIPIDEMIA   . HYPERTENSION   . LUMBAR STRAIN   . NECK PAIN   . PANCREATITIS, HX OF   . PONV (postoperative nausea and vomiting)    nausea only   . Seasonal allergies   . SHOULDER PAIN, LEFT   . Substance abuse ([place])    quit in 2007  . TENDINITIS, PATELLAR     Social History   Socioeconomic History  . Marital status: Married    Spouse name: Not on file  . Number of children: Not on file  . Years of education: Not on file  . Highest education level: Not on file  Social Needs  . Financial resource strain: Not on file  . Food insecurity - worry: Not on file  . Food insecurity - inability: Not on file  . Transportation needs - medical: Not on file  . Transportation needs - non-medical: Not on file  Occupational History  . Not on file  Tobacco Use  . Smoking status: Never Smoker  . Smokeless tobacco: Current User    Types: Chew  . Tobacco comment: form given 11/24/15  Substance and Sexual Activity  . Alcohol use: No    Alcohol/week: 0.0 oz    Comment: hx alcohol abuse  . Drug use:  No  . Sexual activity: Not on file  Other Topics Concern  . Not on file  Social History Narrative   Works as a Games developer   Married to [employer]        Past Surgical History:  Procedure Laterality Date  . IMAGE GUIDED SINUS SURGERY N/A 04/13/2015   Procedure: IMAGE GUIDED SINUS SURGERY;  Surgeon: Carloyn Manner, MD;  Location: [place];  Service: ENT;  Laterality: N/A;  GAVE DISK TO CE CE  . KNEE ARTHROSCOPY Right 2008  . KNEE SURGERY Left    x2  . MAXILLARY ANTROSTOMY Bilateral 04/13/2015   Procedure: MAXILLARY ANTROSTOMY;  Surgeon: Carloyn Manner, MD;  Location: [place];  Service: ENT;  Laterality: Bilateral;    Family History  Problem Relation Age of Onset  . Lung disease Father   . Melanoma Mother   . Heart attack Brother   . Colon polyps Neg Hx   . Colon cancer Neg Hx   . Stomach cancer Neg Hx   . Pancreatic cancer Neg Hx   . Esophageal cancer Neg Hx     Allergies  Allergen Reactions  .  Aspirin Nausea And Vomiting    Current Outpatient Medications on File Prior to Visit  Medication Sig Dispense Refill  . buPROPion (WELLBUTRIN SR) 100 MG 12 hr tablet TAKE 2 TABLETS BY MOUTH EVERY MORNING 200 tablet 2  . cetirizine (ZYRTEC) 10 MG tablet Take 10 mg by mouth daily as needed for allergies. AM    . Diclofenac Sodium (PENNSAID) 2 % SOLN Place 2 puffs onto the skin 2 (two) times daily as needed.    . fluticasone (FLONASE) 50 MCG/ACT nasal spray USE 1 SPRAY IN EACH NOSTRIL ONCE A DAY 32 g 5  . losartan (COZAAR) 50 MG tablet TAKE 1 TABLET EVERY DAY 90 tablet 3  . lovastatin (MEVACOR) 20 MG tablet Take 1 tablet (20 mg total) by mouth at bedtime. 90 tablet 3  . omeprazole (PRILOSEC) 20 MG capsule TAKE 1 CAPSULE BY MOUTH EVERY DAY 90 capsule 1  . traMADol (ULTRAM) 50 MG tablet TAKE 1 TABLET BY MOUTH 3 TIMES A DAY AS NEEDED 90 tablet 2   No current facility-administered medications on file prior to visit.     BP 126/82   Temp 98.6 F (37 C) (Oral)    Wt 231 lb (104.8 kg)   BMI 28.87 kg/m      Objective:   Physical Exam  Constitutional: He is oriented to person, place, and time. He appears well-developed and well-nourished. No distress.  Cardiovascular: Normal rate, regular rhythm and normal heart sounds.  No murmur heard. Pulmonary/Chest: Effort normal and breath sounds normal. No respiratory distress. He has no wheezes. He exhibits tenderness.  Tenderness over the left 10th and 11th rib and intercostal space laterally and anteriorly.   Abdominal: Bowel sounds are normal. There is tenderness. There is rebound.  Tenderness LUQ with rebound.  Unable to palpate Splenic border. No rigidity, bruising noted.    Neurological: He is alert and oriented to person, place, and time.  Nursing note and vitals reviewed.     Assessment & Plan:  1. Rib pain Heat to right lower back.  Use muscle relaxer as prescribed.   Will call with CXR results. Continue to use Advil as needed for pain.  - DG Chest 2 View; Future - cyclobenzaprine (FLEXERIL) 10 MG tablet; Take 1 tablet (10 mg total) by mouth 3 (three) times daily as needed for muscle spasms.  Dispense: 30 tablet; Refill: 0  Verdean Murin C Roslind Michaux BSN RN NP student

## 2017-11-17 DIAGNOSIS — J019 Acute sinusitis, unspecified: Secondary | ICD-10-CM | POA: Diagnosis not present

## 2017-11-29 ENCOUNTER — Other Ambulatory Visit: Payer: Self-pay | Admitting: Adult Health

## 2017-11-29 DIAGNOSIS — M25561 Pain in right knee: Principal | ICD-10-CM

## 2017-11-29 DIAGNOSIS — G8929 Other chronic pain: Secondary | ICD-10-CM

## 2017-11-29 DIAGNOSIS — M25562 Pain in left knee: Principal | ICD-10-CM

## 2017-12-03 ENCOUNTER — Other Ambulatory Visit: Payer: Self-pay | Admitting: Adult Health

## 2017-12-03 DIAGNOSIS — M25562 Pain in left knee: Principal | ICD-10-CM

## 2017-12-03 DIAGNOSIS — M25561 Pain in right knee: Principal | ICD-10-CM

## 2017-12-03 DIAGNOSIS — G8929 Other chronic pain: Secondary | ICD-10-CM

## 2017-12-03 MED ORDER — TRAMADOL HCL 50 MG PO TABS: 50.0000 mg | ORAL_TABLET | Freq: Three times a day (TID) | ORAL | 0 refills | Status: DC | PRN

## 2017-12-03 NOTE — Telephone Encounter (Signed)
OK to refill Losrtan for 90 days.   I will send in Tramadol.   He is going to be due for CPE in May

## 2017-12-03 NOTE — Telephone Encounter (Signed)
Losartan sent in for 90 days.  Cory filled Tramadol in a different encounter.

## 2017-12-25 ENCOUNTER — Encounter: Payer: Self-pay | Admitting: Adult Health

## 2017-12-26 ENCOUNTER — Encounter: Payer: Self-pay | Admitting: Adult Health

## 2017-12-26 ENCOUNTER — Ambulatory Visit (INDEPENDENT_AMBULATORY_CARE_PROVIDER_SITE_OTHER)
Admission: RE | Admit: 2017-12-26 | Discharge: 2017-12-26 | Disposition: A | Discharge status: Home / Self Care | Payer: 59 | Source: Ambulatory Visit | Attending: Adult Health | Admitting: Adult Health

## 2017-12-26 ENCOUNTER — Ambulatory Visit: Payer: 59 | Admitting: Adult Health

## 2017-12-26 VITALS — BP 128/80 | Temp 98.3°F | Wt 224.0 lb

## 2017-12-26 DIAGNOSIS — M19042 Primary osteoarthritis, left hand: Secondary | ICD-10-CM | POA: Diagnosis not present

## 2017-12-26 DIAGNOSIS — M79645 Pain in left finger(s): Secondary | ICD-10-CM

## 2017-12-26 MED ORDER — DOXYCYCLINE HYCLATE 100 MG PO CAPS: 100.0000 mg | ORAL_CAPSULE | Freq: Two times a day (BID) | ORAL | 0 refills | Status: DC

## 2017-12-26 NOTE — Progress Notes (Signed)
Subjective:    Patient ID: [name], male    DOB: May 12, 1966, 52 y.o.   MRN: [id number]  HPI  52 year old male who presents to the office today with a complaint of pain in his left pinky.  Reports that he was working and possibly got a splinter in his finger.  This happened about 1 month ago.  Does report small amount Drainage.  Slight discomfort.  No redness warmth.  Review of Systems See HPI   Past Medical History:  Diagnosis Date  . ALLERGIC RHINITIS   . ANXIETY   . ASTHMA   . BACK PAIN   . ERECTILE DYSFUNCTION   . GERD   . HYPERLIPIDEMIA   . HYPERTENSION   . LUMBAR STRAIN   . NECK PAIN   . PANCREATITIS, HX OF   . PONV (postoperative nausea and vomiting)    nausea only   . Seasonal allergies   . SHOULDER PAIN, LEFT   . Substance abuse ([place])    quit in 2007  . TENDINITIS, PATELLAR     Social History   Socioeconomic History  . Marital status: Married    Spouse name: Not on file  . Number of children: Not on file  . Years of education: Not on file  . Highest education level: Not on file  Occupational History  . Not on file  Social Needs  . Financial resource strain: Not on file  . Food insecurity:    Worry: Not on file    Inability: Not on file  . Transportation needs:    Medical: Not on file    Non-medical: Not on file  Tobacco Use  . Smoking status: Never Smoker  . Smokeless tobacco: Current User    Types: Chew  . Tobacco comment: form given 11/24/15  Substance and Sexual Activity  . Alcohol use: No    Alcohol/week: 0.0 oz    Comment: hx alcohol abuse  . Drug use: No  . Sexual activity: Not on file  Lifestyle  . Physical activity:    Days per week: Not on file    Minutes per session: Not on file  . Stress: Not on file  Relationships  . Social connections:    Talks on phone: Not on file    Gets together: Not on file    Attends religious service: Not on file    Active member of club or organization: Not on file    Attends meetings of  clubs or organizations: Not on file    Relationship status: Not on file  . Intimate partner violence:    Fear of current or ex partner: Not on file    Emotionally abused: Not on file    Physically abused: Not on file    Forced sexual activity: Not on file  Other Topics Concern  . Not on file  Social History Narrative   Works as a Games developer   Married to [employer]        Past Surgical History:  Procedure Laterality Date  . IMAGE GUIDED SINUS SURGERY N/A 04/13/2015   Procedure: IMAGE GUIDED SINUS SURGERY;  Surgeon: Carloyn Manner, MD;  Location: [place];  Service: ENT;  Laterality: N/A;  GAVE DISK TO CE CE  . KNEE ARTHROSCOPY Right 2008  . KNEE SURGERY Left    x2  . MAXILLARY ANTROSTOMY Bilateral 04/13/2015   Procedure: MAXILLARY ANTROSTOMY;  Surgeon: Carloyn Manner, MD;  Location: [place];  Service: ENT;  Laterality: Bilateral;  Family History  Problem Relation Age of Onset  . Lung disease Father   . Melanoma Mother   . Heart attack Brother   . Colon polyps Neg Hx   . Colon cancer Neg Hx   . Stomach cancer Neg Hx   . Pancreatic cancer Neg Hx   . Esophageal cancer Neg Hx     Allergies  Allergen Reactions  . Aspirin Nausea And Vomiting    Current Outpatient Medications on File Prior to Visit  Medication Sig Dispense Refill  . buPROPion (WELLBUTRIN SR) 100 MG 12 hr tablet TAKE 2 TABLETS BY MOUTH EVERY MORNING 200 tablet 2  . cetirizine (ZYRTEC) 10 MG tablet Take 10 mg by mouth daily as needed for allergies. AM    . cyclobenzaprine (FLEXERIL) 10 MG tablet Take 1 tablet (10 mg total) by mouth 3 (three) times daily as needed for muscle spasms. 30 tablet 0  . Diclofenac Sodium (PENNSAID) 2 % SOLN Place 2 puffs onto the skin 2 (two) times daily as needed.    . fluticasone (FLONASE) 50 MCG/ACT nasal spray USE 1 SPRAY IN EACH NOSTRIL ONCE A DAY 32 g 5  . losartan (COZAAR) 50 MG tablet TAKE 1 TABLET EVERY DAY 90 tablet 0  . lovastatin (MEVACOR) 20 MG  tablet Take 1 tablet (20 mg total) by mouth at bedtime. 90 tablet 3  . omeprazole (PRILOSEC) 20 MG capsule TAKE 1 CAPSULE BY MOUTH EVERY DAY 90 capsule 1  . traMADol (ULTRAM) 50 MG tablet Take 1 tablet (50 mg total) by mouth 3 (three) times daily as needed. 90 tablet 0   No current facility-administered medications on file prior to visit.     BP 128/80   Temp 98.3 F (36.8 C) (Oral)   Wt 224 lb (101.6 kg)   BMI 28.00 kg/m       Objective:   Physical Exam  Constitutional: He appears well-developed and well-nourished. No distress.  Pulmonary/Chest: Effort normal and breath sounds normal.  Skin: Skin is warm and dry. No rash noted. He is not diaphoretic. No erythema. No pallor.  Have a small pencil eraser sized callus with clear fluid on the pad of the PIP joint of left pinky finger  Psychiatric: He has a normal mood and affect. His behavior is normal. Judgment and thought content normal.  Nursing note and vitals reviewed.     Assessment & Plan:  1. Finger pain, left - Procedure:  Incision and drainage of left pinky finger Risks, benefits, and alternatives explained and consent obtained. Time out conducted. Surface cleaned with alcohol and bedadine  0.3cc lidocaine without epinephine infiltrated around abscess. Adequate anesthesia ensured. Area prepped and draped in a sterile fashion. #11 blade used to make a professional stab incision into callous.  No known body visualized Clear fluid expressed Hemostasis achieved. Pt stable. Aftercare and follow-up advised.   -X-ray of hand to look for foreign body.  Start on doxycycline.  Consider referral to hand surgery if needed - DG Hand Complete Left; Future - doxycycline (VIBRAMYCIN) 100 MG capsule; Take 1 capsule (100 mg total) by mouth 2 (two) times daily.  Dispense: 20 capsule; Refill: 0  Dorothyann Peng, NP

## 2017-12-29 ENCOUNTER — Other Ambulatory Visit: Payer: Self-pay | Admitting: Adult Health

## 2017-12-29 DIAGNOSIS — R1013 Epigastric pain: Secondary | ICD-10-CM

## 2017-12-30 ENCOUNTER — Encounter: Payer: Self-pay | Admitting: Adult Health

## 2017-12-31 NOTE — Telephone Encounter (Signed)
Sent to the pharmacy by e-scribe. 

## 2018-01-08 ENCOUNTER — Other Ambulatory Visit: Payer: Self-pay | Admitting: Adult Health

## 2018-01-08 DIAGNOSIS — M25561 Pain in right knee: Principal | ICD-10-CM

## 2018-01-08 DIAGNOSIS — G8929 Other chronic pain: Secondary | ICD-10-CM

## 2018-01-08 DIAGNOSIS — M25562 Pain in left knee: Principal | ICD-10-CM

## 2018-02-11 ENCOUNTER — Other Ambulatory Visit: Payer: Self-pay | Admitting: Family Medicine

## 2018-02-28 ENCOUNTER — Other Ambulatory Visit: Payer: Self-pay | Admitting: Adult Health

## 2018-02-28 NOTE — Telephone Encounter (Signed)
Sent to the pharmacy by e-scribe. 

## 2018-03-29 ENCOUNTER — Other Ambulatory Visit: Payer: Self-pay | Admitting: Adult Health

## 2018-03-29 DIAGNOSIS — R1013 Epigastric pain: Secondary | ICD-10-CM

## 2018-04-15 ENCOUNTER — Encounter: Payer: Self-pay | Admitting: Adult Health

## 2018-04-16 ENCOUNTER — Ambulatory Visit (INDEPENDENT_AMBULATORY_CARE_PROVIDER_SITE_OTHER): Payer: 59 | Admitting: Adult Health

## 2018-04-16 ENCOUNTER — Encounter: Payer: Self-pay | Admitting: Adult Health

## 2018-04-16 ENCOUNTER — Other Ambulatory Visit (INDEPENDENT_AMBULATORY_CARE_PROVIDER_SITE_OTHER): Payer: 59

## 2018-04-16 VITALS — BP 124/86 | Temp 98.2°F | Ht 74.0 in | Wt 223.0 lb

## 2018-04-16 DIAGNOSIS — G8929 Other chronic pain: Secondary | ICD-10-CM

## 2018-04-16 DIAGNOSIS — R1013 Epigastric pain: Secondary | ICD-10-CM

## 2018-04-16 DIAGNOSIS — R7301 Impaired fasting glucose: Secondary | ICD-10-CM | POA: Diagnosis not present

## 2018-04-16 DIAGNOSIS — F329 Major depressive disorder, single episode, unspecified: Secondary | ICD-10-CM | POA: Diagnosis not present

## 2018-04-16 DIAGNOSIS — I10 Essential (primary) hypertension: Secondary | ICD-10-CM | POA: Diagnosis not present

## 2018-04-16 DIAGNOSIS — Z125 Encounter for screening for malignant neoplasm of prostate: Secondary | ICD-10-CM

## 2018-04-16 DIAGNOSIS — M65331 Trigger finger, right middle finger: Secondary | ICD-10-CM

## 2018-04-16 DIAGNOSIS — M25561 Pain in right knee: Secondary | ICD-10-CM

## 2018-04-16 DIAGNOSIS — E785 Hyperlipidemia, unspecified: Secondary | ICD-10-CM | POA: Diagnosis not present

## 2018-04-16 DIAGNOSIS — F32A Depression, unspecified: Secondary | ICD-10-CM | POA: Insufficient documentation

## 2018-04-16 DIAGNOSIS — M25562 Pain in left knee: Secondary | ICD-10-CM

## 2018-04-16 DIAGNOSIS — Z0000 Encounter for general adult medical examination without abnormal findings: Secondary | ICD-10-CM | POA: Diagnosis not present

## 2018-04-16 LAB — HEPATIC FUNCTION PANEL
ALT: 34 U/L (ref 0–53)
AST: 19 U/L (ref 0–37)
Albumin: 4.5 g/dL (ref 3.5–5.2)
Alkaline Phosphatase: 53 U/L (ref 39–117)
BILIRUBIN DIRECT: 0.2 mg/dL (ref 0.0–0.3)
BILIRUBIN TOTAL: 1 mg/dL (ref 0.2–1.2)
Total Protein: 6.3 g/dL (ref 6.0–8.3)

## 2018-04-16 LAB — CBC WITH DIFFERENTIAL/PLATELET
BASOS ABS: 0 10*3/uL (ref 0.0–0.1)
Basophils Relative: 0.6 % (ref 0.0–3.0)
EOS ABS: 0.1 10*3/uL (ref 0.0–0.7)
Eosinophils Relative: 3.3 % (ref 0.0–5.0)
HCT: 43.4 % (ref 39.0–52.0)
HEMOGLOBIN: 15.3 g/dL (ref 13.0–17.0)
LYMPHS ABS: 1.3 10*3/uL (ref 0.7–4.0)
Lymphocytes Relative: 30.3 % (ref 12.0–46.0)
MCHC: 35.4 g/dL (ref 30.0–36.0)
MCV: 84.9 fl (ref 78.0–100.0)
MONO ABS: 0.3 10*3/uL (ref 0.1–1.0)
Monocytes Relative: 6.7 % (ref 3.0–12.0)
NEUTROS PCT: 59.1 % (ref 43.0–77.0)
Neutro Abs: 2.5 10*3/uL (ref 1.4–7.7)
Platelets: 143 10*3/uL — ABNORMAL LOW (ref 150.0–400.0)
RBC: 5.11 Mil/uL (ref 4.22–5.81)
RDW: 12.8 % (ref 11.5–15.5)
WBC: 4.2 10*3/uL (ref 4.0–10.5)

## 2018-04-16 LAB — LIPID PANEL
CHOL/HDL RATIO: 4
Cholesterol: 155 mg/dL (ref 0–200)
HDL: 36.3 mg/dL — ABNORMAL LOW (ref >39.00)
LDL CALC: 92 mg/dL (ref 0–99)
NonHDL: 118.81
Triglycerides: 133 mg/dL (ref 0.0–149.0)
VLDL: 26.6 mg/dL (ref 0.0–40.0)

## 2018-04-16 LAB — TSH: TSH: 4.7 u[IU]/mL — AB (ref 0.35–4.50)

## 2018-04-16 LAB — BASIC METABOLIC PANEL
BUN: 21 mg/dL (ref 6–23)
CALCIUM: 9.4 mg/dL (ref 8.4–10.5)
CO2: 28 mEq/L (ref 19–32)
CREATININE: 0.9 mg/dL (ref 0.40–1.50)
Chloride: 104 mEq/L (ref 96–112)
GFR: 94.15 mL/min (ref >60.00)
Glucose, Bld: 114 mg/dL — ABNORMAL HIGH (ref 70–99)
POTASSIUM: 4.2 meq/L (ref 3.5–5.1)
Sodium: 139 mEq/L (ref 135–145)

## 2018-04-16 LAB — HEMOGLOBIN A1C: Hgb A1c MFr Bld: 5.9 % (ref 4.6–6.5)

## 2018-04-16 LAB — PSA: PSA: 1.3 ng/mL (ref 0.10–4.00)

## 2018-04-16 MED ORDER — LOSARTAN POTASSIUM 50 MG PO TABS: 50.0000 mg | ORAL_TABLET | Freq: Every day | ORAL | 3 refills | Status: DC

## 2018-04-16 MED ORDER — OMEPRAZOLE 20 MG PO CPDR: DELAYED_RELEASE_CAPSULE | ORAL | 3 refills | Status: DC

## 2018-04-16 NOTE — Progress Notes (Signed)
Subjective:    Patient ID: [name], male    DOB: 13-Apr-1966, 52 y.o.   MRN: [id number]  HPI Patient presents for yearly preventative medicine examination. He is a pleasant 52 year old male who  has a past medical history of ALLERGIC RHINITIS, ANXIETY, ASTHMA, BACK PAIN, ERECTILE DYSFUNCTION, GERD, HYPERLIPIDEMIA, HYPERTENSION, LUMBAR STRAIN, NECK PAIN, PANCREATITIS, HX OF, PONV (postoperative nausea and vomiting), Seasonal allergies, SHOULDER PAIN, LEFT, Substance abuse ([place]), and TENDINITIS, PATELLAR.   Essential Hypertension - Controlled with Cozaar 50 mg daily  BP Readings from Last 3 Encounters:  04/16/18 124/86  12/26/17 128/80  10/29/17 126/82   Hyperlipidemia - Controlled with statin  Lab Results  Component Value Date   CHOL 185 02/01/2017   HDL 43.40 02/01/2017   LDLCALC 108 (H) 02/01/2017   LDLDIRECT 147.2 07/24/2012   TRIG 167.0 (H) 02/01/2017   CHOLHDL 4 02/01/2017   GERD - takes prilosec 20 mg daily - controled.   Depression - Takes Wellbutrin 200 mg - controlled   Chronic Knee pain - pain controlled with Tramadol 50 mg TID PRN   All immunizations and health maintenance protocols were reviewed with the patient and needed orders were placed. UTD  Appropriate screening laboratory values were ordered for the patient including screening of hyperlipidemia, renal function and hepatic function. If indicated by BPH, a PSA was ordered.  Medication reconciliation,  past medical history, social history, problem list and allergies were reviewed in detail with the patient  Goals were established with regard to weight loss, exercise, and  diet in compliance with medications. He is very active at work. Does not follow a specific diet but tries to eat healthy  Wt Readings from Last 3 Encounters:  04/16/18 223 lb (101.2 kg)  12/26/17 224 lb (101.6 kg)  10/29/17 231 lb (104.8 kg)   His only acute complaint is that of trigger finger(s) on his right hand. His middle and  ring finger are the affected fingers. Endorses a popping sensation and stiff fingers in the morning.   He is UTD on routine colonoscopy. Has not been seen for vision or dental screens this year  Review of Systems  Constitutional: Negative.   HENT: Negative.   Eyes: Negative.   Respiratory: Negative.   Cardiovascular: Negative.   Gastrointestinal: Negative.   Endocrine: Negative.   Genitourinary: Negative.   Musculoskeletal: Positive for arthralgias.  Skin: Negative.   Allergic/Immunologic: Negative.   Neurological: Negative.   Hematological: Negative.   Psychiatric/Behavioral: Negative.   All other systems reviewed and are negative.  Past Medical History:  Diagnosis Date  . ALLERGIC RHINITIS   . ANXIETY   . ASTHMA   . BACK PAIN   . ERECTILE DYSFUNCTION   . GERD   . HYPERLIPIDEMIA   . HYPERTENSION   . LUMBAR STRAIN   . NECK PAIN   . PANCREATITIS, HX OF   . PONV (postoperative nausea and vomiting)    nausea only   . Seasonal allergies   . SHOULDER PAIN, LEFT   . Substance abuse ([place])    quit in 2007  . TENDINITIS, PATELLAR     Social History   Socioeconomic History  . Marital status: Married    Spouse name: Not on file  . Number of children: Not on file  . Years of education: Not on file  . Highest education level: Not on file  Occupational History  . Not on file  Social Needs  . Financial resource strain: Not on file  .  Food insecurity:    Worry: Not on file    Inability: Not on file  . Transportation needs:    Medical: Not on file    Non-medical: Not on file  Tobacco Use  . Smoking status: Never Smoker  . Smokeless tobacco: Current User    Types: Chew  . Tobacco comment: form given 11/24/15  Substance and Sexual Activity  . Alcohol use: No    Alcohol/week: 0.0 oz    Comment: hx alcohol abuse  . Drug use: No  . Sexual activity: Not on file  Lifestyle  . Physical activity:    Days per week: Not on file    Minutes per session: Not on file  .  Stress: Not on file  Relationships  . Social connections:    Talks on phone: Not on file    Gets together: Not on file    Attends religious service: Not on file    Active member of club or organization: Not on file    Attends meetings of clubs or organizations: Not on file    Relationship status: Not on file  . Intimate partner violence:    Fear of current or ex partner: Not on file    Emotionally abused: Not on file    Physically abused: Not on file    Forced sexual activity: Not on file  Other Topics Concern  . Not on file  Social History Narrative   Works as a Games developer   Married to [employer]        Past Surgical History:  Procedure Laterality Date  . IMAGE GUIDED SINUS SURGERY N/A 04/13/2015   Procedure: IMAGE GUIDED SINUS SURGERY;  Surgeon: Carloyn Manner, MD;  Location: [place];  Service: ENT;  Laterality: N/A;  GAVE DISK TO CE CE  . KNEE ARTHROSCOPY Right 2008  . KNEE SURGERY Left    x2  . MAXILLARY ANTROSTOMY Bilateral 04/13/2015   Procedure: MAXILLARY ANTROSTOMY;  Surgeon: Carloyn Manner, MD;  Location: [place];  Service: ENT;  Laterality: Bilateral;    Family History  Problem Relation Age of Onset  . Lung disease Father   . Melanoma Mother   . Heart attack Brother   . Colon polyps Neg Hx   . Colon cancer Neg Hx   . Stomach cancer Neg Hx   . Pancreatic cancer Neg Hx   . Esophageal cancer Neg Hx     Allergies  Allergen Reactions  . Aspirin Nausea And Vomiting    Current Outpatient Medications on File Prior to Visit  Medication Sig Dispense Refill  . buPROPion (WELLBUTRIN SR) 100 MG 12 hr tablet TAKE 2 TABLETS BY MOUTH EVERY DAY IN THE MORNING 200 tablet 4  . cetirizine (ZYRTEC) 10 MG tablet Take 10 mg by mouth daily as needed for allergies. AM    . doxycycline (VIBRAMYCIN) 100 MG capsule Take 1 capsule (100 mg total) by mouth 2 (two) times daily. 20 capsule 0  . fluticasone (FLONASE) 50 MCG/ACT nasal spray USE 1 SPRAY IN EACH  NOSTRIL ONCE A DAY 32 g 5  . losartan (COZAAR) 50 MG tablet TAKE 1 TABLET BY MOUTH EVERY DAY 90 tablet 0  . lovastatin (MEVACOR) 20 MG tablet Take 1 tablet (20 mg total) by mouth at bedtime. 90 tablet 3  . omeprazole (PRILOSEC) 20 MG capsule TAKE 1 CAPSULE BY MOUTH EVERY DAY 90 capsule 0  . traMADol (ULTRAM) 50 MG tablet TAKE 1 TABLET BY MOUTH 3 TIMES DAILY AS NEEDED.  90 tablet 2   No current facility-administered medications on file prior to visit.     BP 124/86   Temp 98.2 F (36.8 C) (Oral)   Ht 6\' 2"  (1.88 m)   Wt 223 lb (101.2 kg)   BMI 28.63 kg/m       Objective:   Physical Exam  Constitutional: He is oriented to person, place, and time. He appears well-developed and well-nourished. No distress.  HENT:  Head: Normocephalic and atraumatic.  Right Ear: External ear normal.  Left Ear: External ear normal.  Nose: Nose normal.  Mouth/Throat: Oropharynx is clear and moist. No oropharyngeal exudate.  Eyes: Pupils are equal, round, and reactive to light. Conjunctivae and EOM are normal. Right eye exhibits no discharge. Left eye exhibits no discharge. No scleral icterus.  Neck: Normal range of motion. Neck supple. No JVD present. No tracheal deviation present. No thyromegaly present.  Cardiovascular: Normal rate, regular rhythm, normal heart sounds and intact distal pulses. Exam reveals no gallop and no friction rub.  No murmur heard. Pulmonary/Chest: Effort normal and breath sounds normal. No stridor. No respiratory distress. He has no wheezes. He has no rales. He exhibits no tenderness.  Abdominal: Soft. Bowel sounds are normal. He exhibits no distension and no mass. There is no tenderness. There is no rebound and no guarding. No hernia.  Genitourinary:  Genitourinary Comments: Will do PSA  Musculoskeletal: Normal range of motion. He exhibits no edema, tenderness or deformity.  Trigger finger noted to right middle and ring finger. Tender nodule felt at base of affected fingers.     Lymphadenopathy:    He has no cervical adenopathy.  Neurological: He is alert and oriented to person, place, and time. He displays normal reflexes. No cranial nerve deficit or sensory deficit. He exhibits normal muscle tone. Coordination normal.  Skin: Skin is warm and dry. Capillary refill takes less than 2 seconds. No rash noted. He is not diaphoretic. No erythema. No pallor.  Psychiatric: He has a normal mood and affect. His behavior is normal. Judgment and thought content normal.  Nursing note and vitals reviewed.     Assessment & Plan:  1. Routine general medical examination at a health care facility - Encouraged heart healthy diet and exercise  - Follow up in one year or sooner if needed - Basic metabolic panel - CBC with Differential/Platelet - Hepatic function panel - Lipid panel - TSH  2. Essential hypertension - Well controlled. No change in medication  - Basic metabolic panel - CBC with Differential/Platelet - Hepatic function panel - Lipid panel - TSH  3. Hyperlipidemia, unspecified hyperlipidemia type - Consider increasing statin  - Basic metabolic panel - CBC with Differential/Platelet - Hepatic function panel - Lipid panel - TSH  4. Reactive depression - Well controlled with Wellbutrin   5. Chronic pain of both knees - Continue with Tramadol   6. Prostate cancer screening  - PSA  7. Epigastric pain  - omeprazole (PRILOSEC) 20 MG capsule; TAKE 1 CAPSULE BY MOUTH EVERY DAY  Dispense: 90 capsule; Refill: 3  8. Trigger middle finger of right hand  - Ambulatory referral to Hand Surgery  Dorothyann Peng, NP

## 2018-04-21 DIAGNOSIS — M65331 Trigger finger, right middle finger: Secondary | ICD-10-CM | POA: Diagnosis not present

## 2018-04-21 DIAGNOSIS — R52 Pain, unspecified: Secondary | ICD-10-CM | POA: Diagnosis not present

## 2018-04-21 DIAGNOSIS — M65341 Trigger finger, right ring finger: Secondary | ICD-10-CM | POA: Diagnosis not present

## 2018-04-29 ENCOUNTER — Other Ambulatory Visit: Payer: Self-pay | Admitting: Adult Health

## 2018-04-29 ENCOUNTER — Encounter: Payer: Self-pay | Admitting: Adult Health

## 2018-04-29 DIAGNOSIS — G8929 Other chronic pain: Secondary | ICD-10-CM

## 2018-04-29 DIAGNOSIS — M25562 Pain in left knee: Principal | ICD-10-CM

## 2018-04-29 DIAGNOSIS — M25561 Pain in right knee: Principal | ICD-10-CM

## 2018-04-29 MED ORDER — CYCLOBENZAPRINE HCL 10 MG PO TABS: 10.0000 mg | ORAL_TABLET | Freq: Every day | ORAL | 0 refills | Status: DC

## 2018-04-29 NOTE — Telephone Encounter (Signed)
Last fill 01/09/18 Last OV 04/16/18  Ok to fill?

## 2018-05-10 ENCOUNTER — Other Ambulatory Visit: Payer: Self-pay | Admitting: Adult Health

## 2018-05-10 ENCOUNTER — Other Ambulatory Visit: Payer: Self-pay | Admitting: Family Medicine

## 2018-05-13 NOTE — Telephone Encounter (Signed)
Please see if he still needs this medication

## 2018-05-13 NOTE — Telephone Encounter (Signed)
Cory please advise of refill for this patient.    Last refill was 04/29/2018 for #15 of the flexeril Last ov was 04/16/2018  Thanks

## 2018-05-14 NOTE — Telephone Encounter (Signed)
Spoke to the pt and he stated he did not need the medication refilled.  Request denied.  No further action required.

## 2018-05-20 ENCOUNTER — Other Ambulatory Visit: Payer: Self-pay | Admitting: Adult Health

## 2018-05-21 NOTE — Telephone Encounter (Signed)
Sent to the pharmacy by e-scribe. 

## 2018-05-25 ENCOUNTER — Other Ambulatory Visit: Payer: Self-pay | Admitting: Adult Health

## 2018-05-27 NOTE — Telephone Encounter (Signed)
Conversation: Medication Refill  (Newest Message First)  May 14, 2018  Me       7:49 AM  Note    Spoke to the pt and he stated he did not need the medication refilled.  Request denied.  No further action required.

## 2018-06-04 ENCOUNTER — Telehealth: Payer: Self-pay | Admitting: *Deleted

## 2018-06-04 MED ORDER — CYCLOBENZAPRINE HCL 10 MG PO TABS: 10.0000 mg | ORAL_TABLET | Freq: Every day | ORAL | 0 refills | Status: DC

## 2018-06-04 NOTE — Telephone Encounter (Signed)
Last refill 04/29/18  #15 Last office visit 04/16/18 [facility] Okay to fill?

## 2018-06-04 NOTE — Telephone Encounter (Signed)
Ok to refill but if he continues to have back pain he needs to be seen

## 2018-06-23 ENCOUNTER — Other Ambulatory Visit: Payer: Self-pay | Admitting: Adult Health

## 2018-06-24 NOTE — Telephone Encounter (Signed)
Called and spoke to the pt.  He does not need a refill of this medication.  Request denied.

## 2018-07-10 DIAGNOSIS — Z23 Encounter for immunization: Secondary | ICD-10-CM | POA: Diagnosis not present

## 2018-08-21 ENCOUNTER — Other Ambulatory Visit: Payer: Self-pay | Admitting: Adult Health

## 2018-08-21 DIAGNOSIS — M25562 Pain in left knee: Principal | ICD-10-CM

## 2018-08-21 DIAGNOSIS — G8929 Other chronic pain: Secondary | ICD-10-CM

## 2018-08-21 DIAGNOSIS — M25561 Pain in right knee: Principal | ICD-10-CM

## 2018-09-11 ENCOUNTER — Ambulatory Visit: Payer: Self-pay | Admitting: Family Medicine

## 2018-10-04 ENCOUNTER — Other Ambulatory Visit: Payer: Self-pay | Admitting: Adult Health

## 2018-11-14 ENCOUNTER — Other Ambulatory Visit: Payer: Self-pay | Admitting: Adult Health

## 2018-11-18 NOTE — Telephone Encounter (Signed)
Sent to the pharmacy by e-scribe. 

## 2018-12-03 ENCOUNTER — Other Ambulatory Visit: Payer: Self-pay | Admitting: Adult Health

## 2018-12-03 DIAGNOSIS — M25561 Pain in right knee: Principal | ICD-10-CM

## 2018-12-03 DIAGNOSIS — G8929 Other chronic pain: Secondary | ICD-10-CM

## 2018-12-03 DIAGNOSIS — M25562 Pain in left knee: Principal | ICD-10-CM

## 2019-03-16 ENCOUNTER — Other Ambulatory Visit: Payer: Self-pay | Admitting: Adult Health

## 2019-03-16 DIAGNOSIS — M25562 Pain in left knee: Secondary | ICD-10-CM

## 2019-03-16 DIAGNOSIS — G8929 Other chronic pain: Secondary | ICD-10-CM

## 2019-04-02 ENCOUNTER — Other Ambulatory Visit: Payer: Self-pay | Admitting: Adult Health

## 2019-04-03 ENCOUNTER — Encounter: Payer: Self-pay | Admitting: Family Medicine

## 2019-04-03 ENCOUNTER — Other Ambulatory Visit: Payer: Self-pay

## 2019-04-03 ENCOUNTER — Encounter: Payer: Self-pay | Admitting: Adult Health

## 2019-04-03 ENCOUNTER — Ambulatory Visit: Payer: 59 | Admitting: Adult Health

## 2019-04-03 VITALS — BP 140/80 | Temp 98.2°F | Wt 219.0 lb

## 2019-04-03 DIAGNOSIS — M7022 Olecranon bursitis, left elbow: Secondary | ICD-10-CM | POA: Diagnosis not present

## 2019-04-03 MED ORDER — DOXYCYCLINE HYCLATE 100 MG PO CAPS: 100.0000 mg | ORAL_CAPSULE | Freq: Two times a day (BID) | ORAL | 0 refills | Status: DC

## 2019-04-03 MED ORDER — METHYLPREDNISOLONE ACETATE 80 MG/ML IJ SUSP
80.0000 mg | Freq: Once | INTRAMUSCULAR | Status: AC
  Administered 2019-04-03: 80 mg via INTRA_ARTICULAR

## 2019-04-03 NOTE — Progress Notes (Signed)
Subjective:    Patient ID: [name], male    DOB: 02-12-1966, 53 y.o.   MRN: [id number]  HPI 53 year old male who  has a past medical history of ALLERGIC RHINITIS, ANXIETY, ASTHMA, BACK PAIN, ERECTILE DYSFUNCTION, GERD, HYPERLIPIDEMIA, HYPERTENSION, LUMBAR STRAIN, NECK PAIN, PANCREATITIS, HX OF, PONV (postoperative nausea and vomiting), Seasonal allergies, SHOULDER PAIN, LEFT, Substance abuse ([place]), and TENDINITIS, PATELLAR.  He presents to the office today for an acute issue of " lump on my left elbow".  He first noticed approximately 10 days.  He denies any aggravating factors or trauma to the left elbow.  He denies discomfort or pain but does feel as though the elbow has become "more stiff".  He has also noticed some redness and warmth on the tip of the elbow   Review of Systems See HPI   Past Medical History:  Diagnosis Date  . ALLERGIC RHINITIS   . ANXIETY   . ASTHMA   . BACK PAIN   . ERECTILE DYSFUNCTION   . GERD   . HYPERLIPIDEMIA   . HYPERTENSION   . LUMBAR STRAIN   . NECK PAIN   . PANCREATITIS, HX OF   . PONV (postoperative nausea and vomiting)    nausea only   . Seasonal allergies   . SHOULDER PAIN, LEFT   . Substance abuse ([place])    quit in 2007  . TENDINITIS, PATELLAR     Social History   Socioeconomic History  . Marital status: Married    Spouse name: Not on file  . Number of children: Not on file  . Years of education: Not on file  . Highest education level: Not on file  Occupational History  . Not on file  Social Needs  . Financial resource strain: Not on file  . Food insecurity    Worry: Not on file    Inability: Not on file  . Transportation needs    Medical: Not on file    Non-medical: Not on file  Tobacco Use  . Smoking status: Never Smoker  . Smokeless tobacco: Current User    Types: Chew  . Tobacco comment: form given 11/24/15  Substance and Sexual Activity  . Alcohol use: No    Alcohol/week: 0.0 standard drinks    Comment: hx  alcohol abuse  . Drug use: No  . Sexual activity: Not on file  Lifestyle  . Physical activity    Days per week: Not on file    Minutes per session: Not on file  . Stress: Not on file  Relationships  . Social Herbalist on phone: Not on file    Gets together: Not on file    Attends religious service: Not on file    Active member of club or organization: Not on file    Attends meetings of clubs or organizations: Not on file    Relationship status: Not on file  . Intimate partner violence    Fear of current or ex partner: Not on file    Emotionally abused: Not on file    Physically abused: Not on file    Forced sexual activity: Not on file  Other Topics Concern  . Not on file  Social History Narrative   Works as a Games developer   Married to [employer]        Past Surgical History:  Procedure Laterality Date  . IMAGE GUIDED SINUS SURGERY N/A 04/13/2015   Procedure: IMAGE GUIDED SINUS SURGERY;  Surgeon: Carloyn Manner, MD;  Location: [facility];  Service: ENT;  Laterality: N/A;  GAVE DISK TO CE CE  . KNEE ARTHROSCOPY Right 2008  . KNEE SURGERY Left    x2  . MAXILLARY ANTROSTOMY Bilateral 04/13/2015   Procedure: MAXILLARY ANTROSTOMY;  Surgeon: Carloyn Manner, MD;  Location: [facility];  Service: ENT;  Laterality: Bilateral;    Family History  Problem Relation Age of Onset  . Lung disease Father   . Melanoma Mother   . Heart attack Brother   . Colon polyps Neg Hx   . Colon cancer Neg Hx   . Stomach cancer Neg Hx   . Pancreatic cancer Neg Hx   . Esophageal cancer Neg Hx     Allergies  Allergen Reactions  . Aspirin Nausea And Vomiting    Current Outpatient Medications on File Prior to Visit  Medication Sig Dispense Refill  . buPROPion (WELLBUTRIN SR) 100 MG 12 hr tablet TAKE 2 TABLETS EVERY MORNING 180 tablet 5  . cetirizine (ZYRTEC) 10 MG tablet Take 10 mg by mouth daily as needed for allergies. AM    . cyclobenzaprine (FLEXERIL) 10 MG  tablet Take 1 tablet (10 mg total) by mouth at bedtime. 15 tablet 0  . fluticasone (FLONASE) 50 MCG/ACT nasal spray SPRAY 1 SPRAY INTO EACH NOSTRIL EVERY DAY 48 g 1  . losartan (COZAAR) 50 MG tablet Take 1 tablet (50 mg total) by mouth daily. 90 tablet 3  . lovastatin (MEVACOR) 20 MG tablet TAKE 1 TABLET BY MOUTH EVERYDAY AT BEDTIME 90 tablet 0  . omeprazole (PRILOSEC) 20 MG capsule TAKE 1 CAPSULE BY MOUTH EVERY DAY 90 capsule 3  . traMADol (ULTRAM) 50 MG tablet TAKE 1 TABLET BY MOUTH THREE TIMES A DAY AS NEEDED 90 tablet 2   No current facility-administered medications on file prior to visit.     BP 140/80   Temp 98.2 F (36.8 C)   Wt 219 lb (99.3 kg)   BMI 28.12 kg/m       Objective:   Physical Exam Vitals signs and nursing note reviewed.  Constitutional:      Appearance: Normal appearance.  Cardiovascular:     Rate and Rhythm: Normal rate and regular rhythm.     Pulses: Normal pulses.     Heart sounds: Normal heart sounds.  Musculoskeletal:        General: Swelling present. No tenderness.     Comments: Left olecranon bursitis  Skin:    General: Skin is warm and dry.     Findings: Erythema present.     Comments: localized erythema and slight warmness of the left elbow noted  Neurological:     General: No focal deficit present.     Mental Status: He is alert and oriented to person, place, and time.  Psychiatric:        Mood and Affect: Mood normal.        Behavior: Behavior normal.        Thought Content: Thought content normal.        Judgment: Judgment normal.       Assessment & Plan:  1. Olecranon bursitis of left elbow Procedure:  Drainage of olecranon bursitis Risks, benefits, and alternatives explained and consent obtained. Time out conducted. Surface cleaned with alcohol and betadine Adequate anesthesia ensured with cold spray Area prepped and draped in a sterile fashion. 18 gauge needle used to drain 4 ml of yellow cloudy fluid from left olecranon bursa.  80 mg of depo medrol then injected into left olecranon bursa Wrapped with ace bandage Pt stable. Aftercare and follow-up advised.  Will cover for cellulitis with doxycyline   - methylPREDNISolone acetate (DEPO-MEDROL) injection 80 mg - Body fluid culture - doxycycline (VIBRAMYCIN) 100 MG capsule; Take 1 capsule (100 mg total) by mouth 2 (two) times daily.  Dispense: 14 capsule; Refill: 0  Dorothyann Peng, NP

## 2019-04-03 NOTE — Addendum Note (Signed)
Addended by: Diona Foley on: 04/03/2019 05:19 PM   Modules accepted: Orders

## 2019-04-03 NOTE — Telephone Encounter (Signed)
Sent to the pharmacy by e-scribe for 90 days.  Letter released to MyChart. 

## 2019-04-03 NOTE — Addendum Note (Signed)
Addended by: Diona Foley on: 04/03/2019 04:56 PM   Modules accepted: Orders

## 2019-04-05 LAB — SPECIMEN STATUS REPORT

## 2019-04-10 LAB — BODY FLUID CULTURE

## 2019-05-12 IMAGING — DX DG HAND COMPLETE 3+V*L*
3 series · 3 of 3 positions shown · non-contrast
Comparison: None.

CLINICAL DATA: Splinter in the left pinky x1 month. Pain and
swelling since.

EXAM:
LEFT HAND - COMPLETE 3+ VIEW

[hand ap]
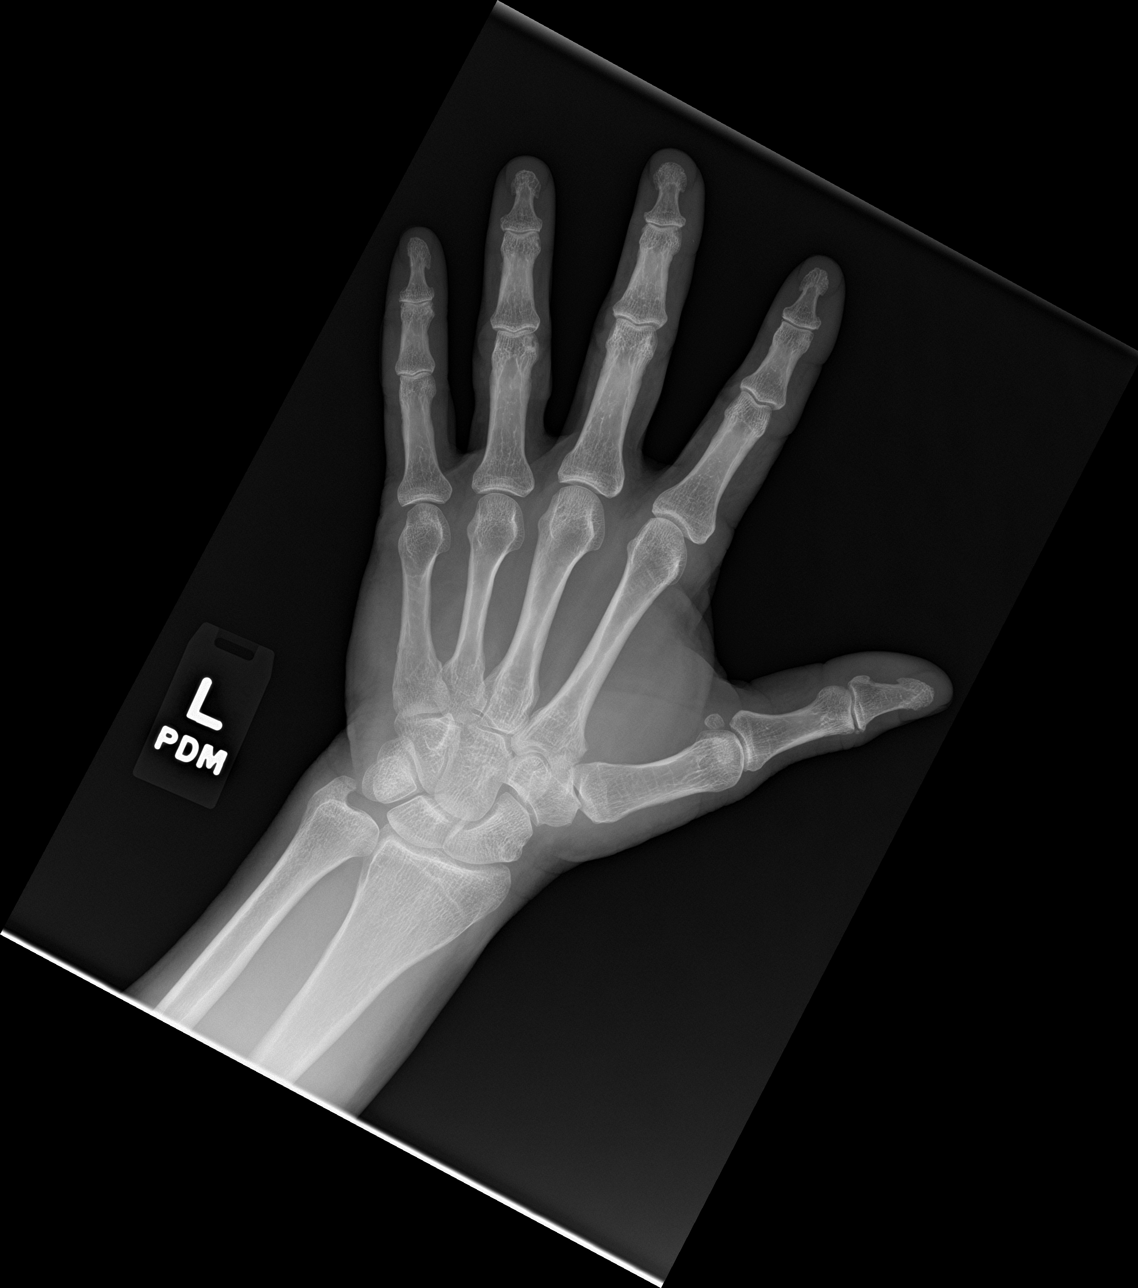

[hand obl]
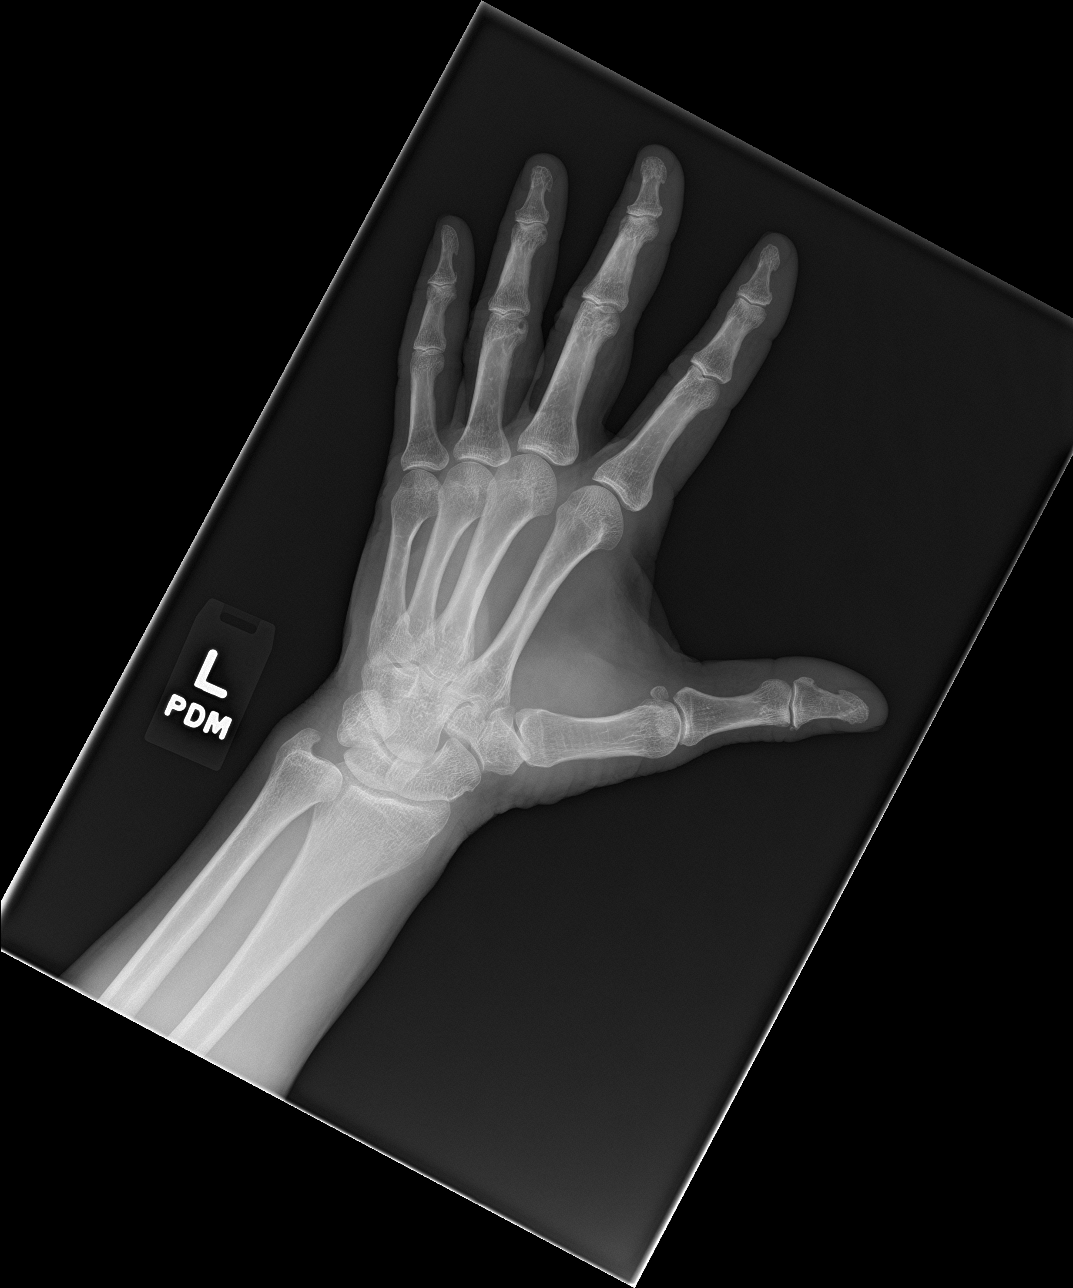

[hand lat]
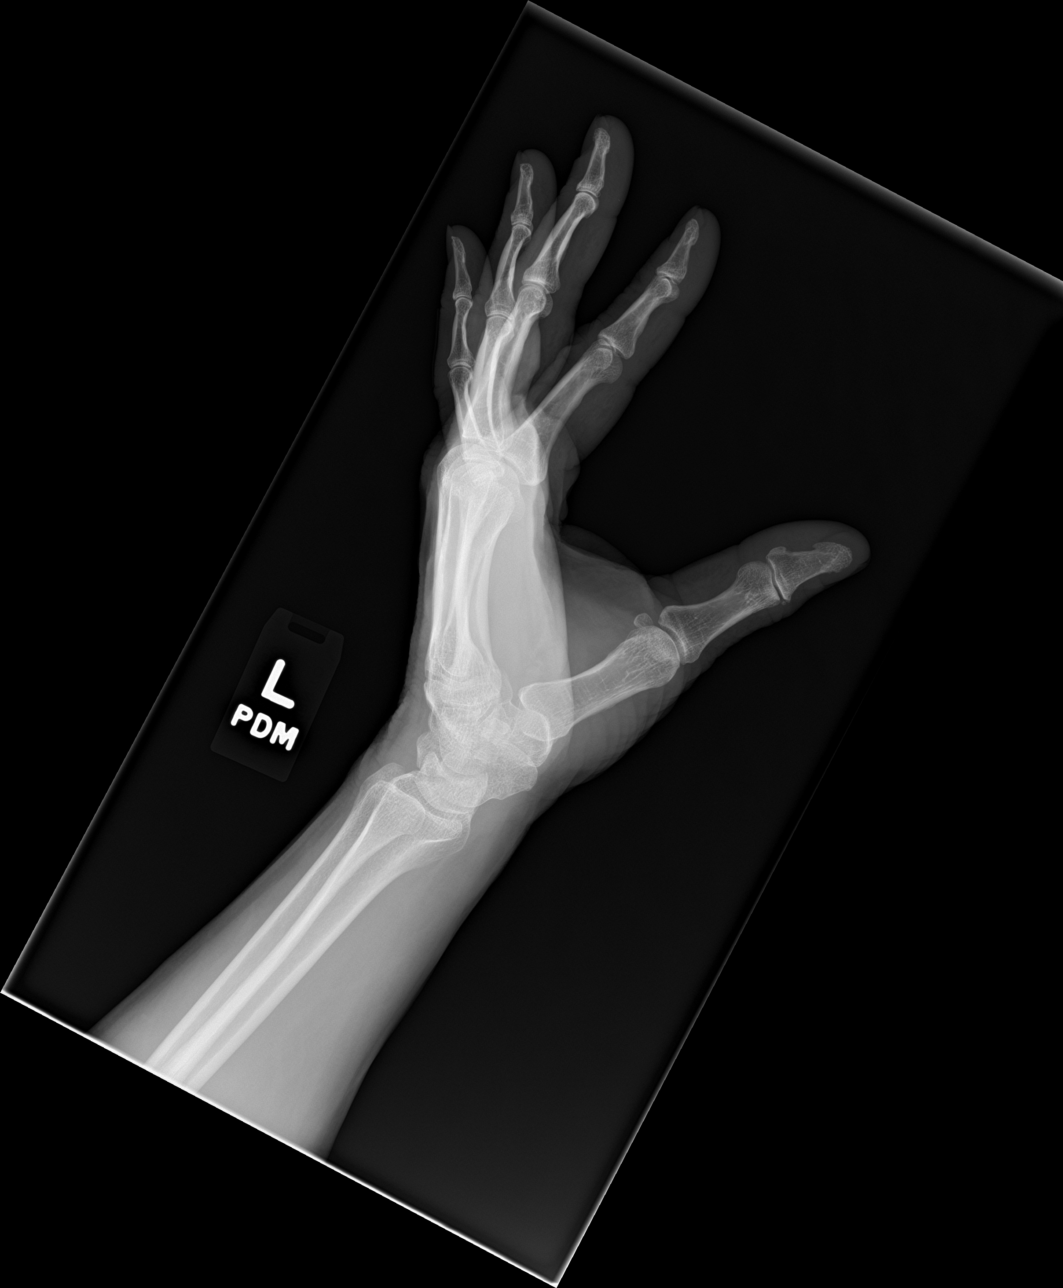

[3 of 3 positions shown; findings below may reference images not displayed]

FINDINGS: No radiopaque foreign body identified. No acute fracture, bone
destruction or joint dislocation. Mild joint space narrowing of the
DIP and PIP joints of the second through fifth digits as well as
interphalangeal joint of the thumb. No erosive change. Carpal rows
are maintained.
IMPRESSION: Slight degenerative joint space narrowing of the DIP and PIP joints
of the left hand and interphalangeal joint of the thumb. No acute
osseous abnormality nor bone destruction. No radiopaque foreign body
is noted.

## 2019-06-06 ENCOUNTER — Encounter: Payer: Self-pay | Admitting: Adult Health

## 2019-06-16 ENCOUNTER — Other Ambulatory Visit: Payer: Self-pay | Admitting: Adult Health

## 2019-06-16 ENCOUNTER — Other Ambulatory Visit: Payer: Self-pay | Admitting: Family Medicine

## 2019-06-16 DIAGNOSIS — G8929 Other chronic pain: Secondary | ICD-10-CM

## 2019-06-16 DIAGNOSIS — M25562 Pain in left knee: Secondary | ICD-10-CM

## 2019-06-16 MED ORDER — BUPROPION HCL ER (SR) 100 MG PO TB12: 200.0000 mg | ORAL_TABLET | Freq: Every morning | ORAL | 0 refills | Status: DC

## 2019-06-16 NOTE — Telephone Encounter (Signed)
Sent to the pharmacy by e-scribe for 90 days.  I have scheduled the pt for cpx on 09/10/2019.

## 2019-06-25 ENCOUNTER — Other Ambulatory Visit: Payer: Self-pay | Admitting: Adult Health

## 2019-06-26 NOTE — Telephone Encounter (Signed)
Sent to the pharmacy by e-scribe. 

## 2019-06-28 ENCOUNTER — Other Ambulatory Visit: Payer: Self-pay | Admitting: Adult Health

## 2019-06-30 ENCOUNTER — Other Ambulatory Visit: Payer: Self-pay | Admitting: Adult Health

## 2019-06-30 ENCOUNTER — Encounter: Payer: Self-pay | Admitting: Adult Health

## 2019-06-30 MED ORDER — CYCLOBENZAPRINE HCL 10 MG PO TABS: 10.0000 mg | ORAL_TABLET | Freq: Every day | ORAL | 0 refills | Status: DC

## 2019-06-30 NOTE — Telephone Encounter (Signed)
Last filled 06/04/2018 Last OV 04/03/2019  Ok to fill?

## 2019-07-01 ENCOUNTER — Other Ambulatory Visit: Payer: Self-pay | Admitting: Adult Health

## 2019-07-01 ENCOUNTER — Encounter: Payer: Self-pay | Admitting: Adult Health

## 2019-07-01 DIAGNOSIS — R1013 Epigastric pain: Secondary | ICD-10-CM

## 2019-07-02 MED ORDER — MELOXICAM 15 MG PO TABS: 15.0000 mg | ORAL_TABLET | Freq: Every day | ORAL | 0 refills | Status: DC | PRN

## 2019-07-02 MED ORDER — OMEPRAZOLE 20 MG PO CPDR: DELAYED_RELEASE_CAPSULE | ORAL | 0 refills | Status: DC

## 2019-07-02 NOTE — Telephone Encounter (Signed)
DENIED.  FILLED ON 06/30/2019 FOR 90 DAYS.

## 2019-07-02 NOTE — Telephone Encounter (Signed)
Sent to the pharmacy by e-scribe. 

## 2019-07-10 ENCOUNTER — Other Ambulatory Visit: Payer: Self-pay | Admitting: Adult Health

## 2019-07-10 DIAGNOSIS — R1013 Epigastric pain: Secondary | ICD-10-CM

## 2019-07-10 NOTE — Telephone Encounter (Signed)
DENIED.  THIS HAS BEEN SENT TO A DIFFERENT PHARMACY.

## 2019-09-10 ENCOUNTER — Other Ambulatory Visit (INDEPENDENT_AMBULATORY_CARE_PROVIDER_SITE_OTHER): Payer: 59

## 2019-09-10 ENCOUNTER — Encounter: Payer: Self-pay | Admitting: Adult Health

## 2019-09-10 ENCOUNTER — Ambulatory Visit (INDEPENDENT_AMBULATORY_CARE_PROVIDER_SITE_OTHER): Payer: 59 | Admitting: Adult Health

## 2019-09-10 ENCOUNTER — Other Ambulatory Visit: Payer: Self-pay

## 2019-09-10 VITALS — BP 130/88 | Temp 97.2°F | Ht 74.0 in | Wt 221.0 lb

## 2019-09-10 DIAGNOSIS — I10 Essential (primary) hypertension: Secondary | ICD-10-CM | POA: Diagnosis not present

## 2019-09-10 DIAGNOSIS — E785 Hyperlipidemia, unspecified: Secondary | ICD-10-CM

## 2019-09-10 DIAGNOSIS — Z0000 Encounter for general adult medical examination without abnormal findings: Secondary | ICD-10-CM

## 2019-09-10 DIAGNOSIS — G8929 Other chronic pain: Secondary | ICD-10-CM

## 2019-09-10 DIAGNOSIS — M25562 Pain in left knee: Secondary | ICD-10-CM

## 2019-09-10 DIAGNOSIS — F329 Major depressive disorder, single episode, unspecified: Secondary | ICD-10-CM

## 2019-09-10 DIAGNOSIS — R7989 Other specified abnormal findings of blood chemistry: Secondary | ICD-10-CM

## 2019-09-10 DIAGNOSIS — M25561 Pain in right knee: Secondary | ICD-10-CM

## 2019-09-10 DIAGNOSIS — Z125 Encounter for screening for malignant neoplasm of prostate: Secondary | ICD-10-CM | POA: Diagnosis not present

## 2019-09-10 DIAGNOSIS — N529 Male erectile dysfunction, unspecified: Secondary | ICD-10-CM

## 2019-09-10 LAB — COMPREHENSIVE METABOLIC PANEL
ALT: 48 U/L (ref 0–53)
AST: 32 U/L (ref 0–37)
Albumin: 4.6 g/dL (ref 3.5–5.2)
Alkaline Phosphatase: 66 U/L (ref 39–117)
BUN: 20 mg/dL (ref 6–23)
CO2: 29 mEq/L (ref 19–32)
Calcium: 9.3 mg/dL (ref 8.4–10.5)
Chloride: 102 mEq/L (ref 96–112)
Creatinine, Ser: 0.95 mg/dL (ref 0.40–1.50)
GFR: 82.78 mL/min (ref >60.00)
Glucose, Bld: 111 mg/dL — ABNORMAL HIGH (ref 70–99)
Potassium: 4.2 mEq/L (ref 3.5–5.1)
Sodium: 139 mEq/L (ref 135–145)
Total Bilirubin: 0.6 mg/dL (ref 0.2–1.2)
Total Protein: 6.5 g/dL (ref 6.0–8.3)

## 2019-09-10 LAB — CBC WITH DIFFERENTIAL/PLATELET
Basophils Absolute: 0 10*3/uL (ref 0.0–0.1)
Basophils Relative: 0.6 % (ref 0.0–3.0)
Eosinophils Absolute: 0.1 10*3/uL (ref 0.0–0.7)
Eosinophils Relative: 3.2 % (ref 0.0–5.0)
HCT: 45.5 % (ref 39.0–52.0)
Hemoglobin: 15.6 g/dL (ref 13.0–17.0)
Lymphocytes Relative: 33.6 % (ref 12.0–46.0)
Lymphs Abs: 1.6 10*3/uL (ref 0.7–4.0)
MCHC: 34.2 g/dL (ref 30.0–36.0)
MCV: 86.2 fl (ref 78.0–100.0)
Monocytes Absolute: 0.4 10*3/uL (ref 0.1–1.0)
Monocytes Relative: 7.7 % (ref 3.0–12.0)
Neutro Abs: 2.5 10*3/uL (ref 1.4–7.7)
Neutrophils Relative %: 54.9 % (ref 43.0–77.0)
Platelets: 174 10*3/uL (ref 150.0–400.0)
RBC: 5.28 Mil/uL (ref 4.22–5.81)
RDW: 12.4 % (ref 11.5–15.5)
WBC: 4.6 10*3/uL (ref 4.0–10.5)

## 2019-09-10 LAB — LIPID PANEL
Cholesterol: 176 mg/dL (ref 0–200)
HDL: 40.3 mg/dL (ref >39.00)
LDL Cholesterol: 120 mg/dL — ABNORMAL HIGH (ref 0–99)
NonHDL: 135.77
Total CHOL/HDL Ratio: 4
Triglycerides: 78 mg/dL (ref 0.0–149.0)
VLDL: 15.6 mg/dL (ref 0.0–40.0)

## 2019-09-10 LAB — PSA: PSA: 1.32 ng/mL (ref 0.10–4.00)

## 2019-09-10 LAB — T4, FREE: Free T4: 0.84 ng/dL (ref 0.60–1.60)

## 2019-09-10 LAB — T3, FREE: T3, Free: 3.8 pg/mL (ref 2.3–4.2)

## 2019-09-10 LAB — TSH: TSH: 5.4 u[IU]/mL — ABNORMAL HIGH (ref 0.35–4.50)

## 2019-09-10 MED ORDER — SILDENAFIL CITRATE 100 MG PO TABS: 100.0000 mg | ORAL_TABLET | Freq: Every day | ORAL | 3 refills | Status: DC | PRN

## 2019-09-10 MED ORDER — METHYLPREDNISOLONE 4 MG PO TBPK: ORAL_TABLET | ORAL | 0 refills | Status: DC

## 2019-09-10 NOTE — Progress Notes (Signed)
Subjective:    Patient ID: [name], male    DOB: 05-04-1966, 53 y.o.   MRN: [id number]  HPI  Patient presents for yearly preventative medicine examination. He is a pleasant 53 year old male who  has a past medical history of ALLERGIC RHINITIS, ANXIETY, ASTHMA, BACK PAIN, ERECTILE DYSFUNCTION, GERD, HYPERLIPIDEMIA, HYPERTENSION, LUMBAR STRAIN, NECK PAIN, PANCREATITIS, HX OF, PONV (postoperative nausea and vomiting), Seasonal allergies, SHOULDER PAIN, LEFT, Substance abuse ([place]), and TENDINITIS, PATELLAR.  Essential Hypertension -controlled with Cozaar 50 mg daily.  He denies dizziness, lightheadedness, chest pain, shortness of breath. BP Readings from Last 3 Encounters:  09/10/19 130/88  04/03/19 140/80  04/16/18 124/86   Hyperlipidemia -currently prescribed lovastatin.  He denies myalgia or fatigue Lab Results  Component Value Date   CHOL 155 04/16/2018   HDL 36.30 (L) 04/16/2018   LDLCALC 92 04/16/2018   LDLDIRECT 147.2 07/24/2012   TRIG 133.0 04/16/2018   CHOLHDL 4 04/16/2018   GERD - Takes Prilosec 20 mg daily-controlled  Depression-well-controlled with Wellbutrin 200 mg daily  Chronic Knee pain -  pain controlled with tramadol 50 mg 3 times daily as needed  ED - uses Viagra PRN - needs a refill   All immunizations and health maintenance protocols were reviewed with the patient and needed orders were placed.  He is up-to-date on all routine vaccinations  Appropriate screening laboratory values were ordered for the patient including screening of hyperlipidemia, renal function and hepatic function. If indicated by BPH, a PSA was ordered.  Medication reconciliation,  past medical history, social history, problem list and allergies were reviewed in detail with the patient  Goals were established with regard to weight loss, exercise, and  diet in compliance with medications.  He is very active at work.  Does not follow specific diet but does try to eat healthy. Wt  Readings from Last 3 Encounters:  09/10/19 221 lb (100.2 kg)  04/03/19 219 lb (99.3 kg)  04/16/18 223 lb (101.2 kg)   Is up-to-date on routine screening colonoscopy.  Review of Systems  Constitutional: Negative.   HENT: Negative.   Eyes: Negative.   Respiratory: Negative.   Cardiovascular: Negative.   Gastrointestinal: Negative.   Endocrine: Negative.   Genitourinary: Negative.   Musculoskeletal: Negative.   Skin: Negative.   Allergic/Immunologic: Negative.   Neurological: Negative.   Hematological: Negative.   Psychiatric/Behavioral: Negative.   All other systems reviewed and are negative.  Past Medical History:  Diagnosis Date  . ALLERGIC RHINITIS   . ANXIETY   . ASTHMA   . BACK PAIN   . ERECTILE DYSFUNCTION   . GERD   . HYPERLIPIDEMIA   . HYPERTENSION   . LUMBAR STRAIN   . NECK PAIN   . PANCREATITIS, HX OF   . PONV (postoperative nausea and vomiting)    nausea only   . Seasonal allergies   . SHOULDER PAIN, LEFT   . Substance abuse ([place])    quit in 2007  . TENDINITIS, PATELLAR     Social History   Socioeconomic History  . Marital status: Married    Spouse name: Not on file  . Number of children: Not on file  . Years of education: Not on file  . Highest education level: Not on file  Occupational History  . Not on file  Tobacco Use  . Smoking status: Never Smoker  . Smokeless tobacco: Current User    Types: Chew  . Tobacco comment: form given 11/24/15  Substance and  Sexual Activity  . Alcohol use: No    Alcohol/week: 0.0 standard drinks    Comment: hx alcohol abuse  . Drug use: No  . Sexual activity: Not on file  Other Topics Concern  . Not on file  Social History Narrative   Works as a Games developer   Married to [place] Determinants of [employer] Resource Strain:   . Difficulty of Paying Living Expenses: Not on file  Food Insecurity:   . Worried About Charity fundraiser in the Last Year: Not on file  . Ran Out of  Food in the Last Year: Not on file  Transportation Needs:   . Lack of Transportation (Medical): Not on file  . Lack of Transportation (Non-Medical): Not on file  Physical Activity:   . Days of Exercise per Week: Not on file  . Minutes of Exercise per Session: Not on file  Stress:   . Feeling of Stress : Not on file  Social Connections:   . Frequency of Communication with Friends and Family: Not on file  . Frequency of Social Gatherings with Friends and Family: Not on file  . Attends Religious Services: Not on file  . Active Member of Clubs or Organizations: Not on file  . Attends Archivist Meetings: Not on file  . Marital Status: Not on file  Intimate Partner Violence:   . Fear of Current or Ex-Partner: Not on file  . Emotionally Abused: Not on file  . Physically Abused: Not on file  . Sexually Abused: Not on file    Past Surgical History:  Procedure Laterality Date  . IMAGE GUIDED SINUS SURGERY N/A 04/13/2015   Procedure: IMAGE GUIDED SINUS SURGERY;  Surgeon: Carloyn Manner, MD;  Location: [facility];  Service: ENT;  Laterality: N/A;  GAVE DISK TO CE CE  . KNEE ARTHROSCOPY Right 2008  . KNEE SURGERY Left    x2  . MAXILLARY ANTROSTOMY Bilateral 04/13/2015   Procedure: MAXILLARY ANTROSTOMY;  Surgeon: Carloyn Manner, MD;  Location: [facility];  Service: ENT;  Laterality: Bilateral;    Family History  Problem Relation Age of Onset  . Lung disease Father   . Melanoma Mother   . Heart attack Brother   . Colon polyps Neg Hx   . Colon cancer Neg Hx   . Stomach cancer Neg Hx   . Pancreatic cancer Neg Hx   . Esophageal cancer Neg Hx     Allergies  Allergen Reactions  . Aspirin Nausea And Vomiting    Current Outpatient Medications on File Prior to Visit  Medication Sig Dispense Refill  . buPROPion (WELLBUTRIN SR) 100 MG 12 hr tablet Take 2 tablets (200 mg total) by mouth every morning. 180 tablet 0  . cetirizine (ZYRTEC) 10 MG tablet Take 10 mg  by mouth daily as needed for allergies. AM    . cyclobenzaprine (FLEXERIL) 10 MG tablet Take 1 tablet (10 mg total) by mouth at bedtime. 30 tablet 0  . fluticasone (FLONASE) 50 MCG/ACT nasal spray SPRAY 1 SPRAY INTO EACH NOSTRIL EVERY DAY 48 g 1  . losartan (COZAAR) 50 MG tablet TAKE 1 TABLET BY MOUTH EVERY DAY 90 tablet 0  . lovastatin (MEVACOR) 20 MG tablet TAKE 1 TABLET BY MOUTH EVERYDAY AT BEDTIME 90 tablet 0  . meloxicam (MOBIC) 15 MG tablet Take 1 tablet (15 mg total) by mouth daily as needed for pain. Reported on 01/12/2016 90 tablet  0  . omeprazole (PRILOSEC) 20 MG capsule TAKE 1 CAPSULE BY MOUTH EVERY DAY 90 capsule 0  . traMADol (ULTRAM) 50 MG tablet TAKE 1 TABLET BY MOUTH THREE TIMES A DAY AS NEEDED 90 tablet 2   No current facility-administered medications on file prior to visit.    BP 130/88   Temp (!) 97.2 F (36.2 C) (Temporal)   Ht 6\' 2"  (1.88 m)   Wt 221 lb (100.2 kg)   BMI 28.37 kg/m       Objective:   Physical Exam Vitals and nursing note reviewed.  Constitutional:      Appearance: Normal appearance.  HENT:     Head: Normocephalic and atraumatic.     Right Ear: Tympanic membrane, ear canal and external ear normal. There is no impacted cerumen.     Left Ear: Tympanic membrane, ear canal and external ear normal. There is no impacted cerumen.     Nose: Nose normal. No congestion or rhinorrhea.     Mouth/Throat:     Mouth: Mucous membranes are moist.     Pharynx: Oropharynx is clear.  Eyes:     Extraocular Movements: Extraocular movements intact.     Conjunctiva/sclera: Conjunctivae normal.     Pupils: Pupils are equal, round, and reactive to light.  Neck:     Vascular: No carotid bruit.  Cardiovascular:     Rate and Rhythm: Normal rate and regular rhythm.     Pulses: Normal pulses.     Heart sounds: Normal heart sounds. No murmur. No friction rub. No gallop.   Pulmonary:     Effort: Pulmonary effort is normal. No respiratory distress.     Breath sounds:  Normal breath sounds. No stridor. No wheezing, rhonchi or rales.  Chest:     Chest wall: No tenderness.  Abdominal:     General: Abdomen is flat. Bowel sounds are normal. There is no distension.     Palpations: Abdomen is soft. There is no mass.     Tenderness: There is no abdominal tenderness. There is no right CVA tenderness, left CVA tenderness, guarding or rebound.     Hernia: No hernia is present.  Musculoskeletal:        General: No swelling, tenderness, deformity or signs of injury. Normal range of motion.     Cervical back: Normal range of motion and neck supple.     Right lower leg: No edema.     Left lower leg: No edema.  Skin:    General: Skin is warm and dry.     Capillary Refill: Capillary refill takes less than 2 seconds.     Coloration: Skin is not jaundiced or pale.     Findings: No bruising, erythema, lesion or rash.  Neurological:     General: No focal deficit present.     Mental Status: He is alert and oriented to person, place, and time. Mental status is at baseline.     Cranial Nerves: No cranial nerve deficit.     Sensory: No sensory deficit.     Motor: No weakness.     Coordination: Coordination normal.     Gait: Gait normal.     Deep Tendon Reflexes: Reflexes normal.  Psychiatric:        Mood and Affect: Mood normal.        Behavior: Behavior normal.        Thought Content: Thought content normal.        Judgment: Judgment normal.  Assessment & Plan:  1. Routine general medical examination at a health care facility - Continue to work on weight loss  - Follow up in one year or sooner if needed - CBC with Differential/Platelet - CMP - Lipid panel - TSH  2. Essential hypertension - No change in medicaton  - CBC with Differential/Platelet - CMP - Lipid panel - TSH  3. Hyperlipidemia, unspecified hyperlipidemia type - Continue with statin. Consider increasing dose  - CBC with Differential/Platelet - CMP - Lipid panel - TSH  4. Reactive  depression - Well controlled on Wellbutrin. No change  - CBC with Differential/Platelet - CMP - Lipid panel - TSH  5. Chronic pain of both knees - Continue with Tramadol PRN   6. Prostate cancer screening  - PSA  7. Erectile dysfunction, unspecified erectile dysfunction type - sildenafil (VIAGRA) 100 MG tablet; Take 1 tablet (100 mg total) by mouth daily as needed for erectile dysfunction.  Dispense: 15 tablet; Refill: 3  Dorothyann Peng, NP

## 2019-09-11 ENCOUNTER — Other Ambulatory Visit: Payer: Self-pay | Admitting: Adult Health

## 2019-09-17 ENCOUNTER — Other Ambulatory Visit: Payer: Self-pay | Admitting: Adult Health

## 2019-09-17 DIAGNOSIS — G8929 Other chronic pain: Secondary | ICD-10-CM

## 2019-09-17 DIAGNOSIS — M25562 Pain in left knee: Secondary | ICD-10-CM

## 2019-09-17 NOTE — Telephone Encounter (Signed)
Last Rx given on 10/6 for #90 with 2 refills

## 2019-09-18 ENCOUNTER — Other Ambulatory Visit: Payer: Self-pay | Admitting: Adult Health

## 2019-09-22 MED ORDER — BUPROPION HCL ER (SR) 100 MG PO TB12: 200.0000 mg | ORAL_TABLET | Freq: Every morning | ORAL | 0 refills | Status: DC

## 2019-09-26 ENCOUNTER — Other Ambulatory Visit: Payer: Self-pay | Admitting: Adult Health

## 2019-10-01 ENCOUNTER — Encounter: Payer: Self-pay | Admitting: Adult Health

## 2019-10-02 ENCOUNTER — Other Ambulatory Visit: Payer: Self-pay | Admitting: Adult Health

## 2019-10-02 DIAGNOSIS — R1013 Epigastric pain: Secondary | ICD-10-CM

## 2019-10-02 MED ORDER — CYCLOBENZAPRINE HCL 10 MG PO TABS: 10.0000 mg | ORAL_TABLET | Freq: Every day | ORAL | 2 refills | Status: DC

## 2019-10-24 ENCOUNTER — Other Ambulatory Visit: Payer: Self-pay | Admitting: Adult Health

## 2019-12-12 ENCOUNTER — Other Ambulatory Visit: Payer: Self-pay | Admitting: Adult Health

## 2019-12-14 ENCOUNTER — Telehealth: Payer: Self-pay | Admitting: Adult Health

## 2019-12-14 NOTE — Telephone Encounter (Signed)
[facility], is requesting a refill for Cyclobenzaprine 10 mg. Pharmacy is aware Tommi Rumps, is not in the office today and will return on 12/15/19. Thanks

## 2019-12-14 NOTE — Telephone Encounter (Signed)
SENT TO THE PHARMACY BY E-SCRIBE. 

## 2019-12-15 MED ORDER — CYCLOBENZAPRINE HCL 10 MG PO TABS: 10.0000 mg | ORAL_TABLET | Freq: Every day | ORAL | 1 refills | Status: DC

## 2019-12-21 ENCOUNTER — Other Ambulatory Visit: Payer: Self-pay | Admitting: Adult Health

## 2019-12-22 NOTE — Telephone Encounter (Signed)
Sent to the pharmacy by e-scribe. 

## 2019-12-22 NOTE — Telephone Encounter (Signed)
SENT TO [facility] ON 12/15/2019.  THANKS

## 2020-01-02 ENCOUNTER — Other Ambulatory Visit: Payer: Self-pay | Admitting: Adult Health

## 2020-01-05 NOTE — Telephone Encounter (Signed)
Sent to the pharmacy by e-scribe. 

## 2020-01-08 ENCOUNTER — Other Ambulatory Visit: Payer: Self-pay | Admitting: Adult Health

## 2020-01-08 DIAGNOSIS — G8929 Other chronic pain: Secondary | ICD-10-CM

## 2020-01-08 DIAGNOSIS — M25562 Pain in left knee: Secondary | ICD-10-CM

## 2020-01-08 DIAGNOSIS — R1013 Epigastric pain: Secondary | ICD-10-CM

## 2020-01-24 ENCOUNTER — Other Ambulatory Visit: Payer: Self-pay | Admitting: Adult Health

## 2020-01-26 NOTE — Telephone Encounter (Signed)
SENT TO THE PHARMACY BY E-SCRIBE. 

## 2020-02-29 ENCOUNTER — Other Ambulatory Visit: Payer: Self-pay | Admitting: Orthopedic Surgery

## 2020-02-29 DIAGNOSIS — M75101 Unspecified rotator cuff tear or rupture of right shoulder, not specified as traumatic: Secondary | ICD-10-CM

## 2020-03-13 ENCOUNTER — Other Ambulatory Visit: Payer: Self-pay | Admitting: Adult Health

## 2020-03-13 DIAGNOSIS — M25562 Pain in left knee: Secondary | ICD-10-CM

## 2020-03-13 DIAGNOSIS — G8929 Other chronic pain: Secondary | ICD-10-CM

## 2020-03-21 ENCOUNTER — Other Ambulatory Visit: Payer: Self-pay | Admitting: Adult Health

## 2020-03-22 NOTE — Telephone Encounter (Signed)
SENT IN FOR 6 MONTHS ON 12/14/2019.  REFILL REQUEST IS TOO EARLY.

## 2020-03-23 ENCOUNTER — Other Ambulatory Visit: Payer: Self-pay | Admitting: Adult Health

## 2020-03-23 NOTE — Telephone Encounter (Signed)
SENT IN ON 12/14/2019 FOR 6 MONTHS.  REFILL REQUEST IS TOO EARLY.

## 2020-03-28 ENCOUNTER — Other Ambulatory Visit: Payer: Self-pay | Admitting: Adult Health

## 2020-03-28 ENCOUNTER — Encounter: Payer: Self-pay | Admitting: Adult Health

## 2020-03-30 NOTE — Telephone Encounter (Signed)
DENIED.  SENT IN FOR 6 MONTHS ON 12/14/2019.

## 2020-04-06 ENCOUNTER — Ambulatory Visit
Admission: RE | Admit: 2020-04-06 | Discharge: 2020-04-06 | Disposition: A | Discharge status: Home / Self Care | Payer: 59 | Source: Ambulatory Visit | Attending: Orthopedic Surgery | Admitting: Orthopedic Surgery

## 2020-04-06 ENCOUNTER — Other Ambulatory Visit: Payer: Self-pay

## 2020-04-06 DIAGNOSIS — M75101 Unspecified rotator cuff tear or rupture of right shoulder, not specified as traumatic: Secondary | ICD-10-CM

## 2020-04-13 ENCOUNTER — Other Ambulatory Visit: Payer: Self-pay | Admitting: Adult Health

## 2020-04-13 DIAGNOSIS — M25562 Pain in left knee: Secondary | ICD-10-CM

## 2020-04-13 DIAGNOSIS — G8929 Other chronic pain: Secondary | ICD-10-CM

## 2020-04-13 DIAGNOSIS — R1013 Epigastric pain: Secondary | ICD-10-CM

## 2020-04-13 NOTE — Telephone Encounter (Signed)
Sent to the pharmacy by e-scribe. 

## 2020-04-15 NOTE — Telephone Encounter (Signed)
rx sent to the pharmacy on 04/14/20.

## 2020-06-03 ENCOUNTER — Other Ambulatory Visit: Payer: Self-pay | Admitting: Adult Health

## 2020-06-15 ENCOUNTER — Other Ambulatory Visit: Payer: Self-pay | Admitting: Adult Health

## 2020-06-15 NOTE — Telephone Encounter (Signed)
Meloxicam Rx was refused. Last visit was 09/10/2019. No appt with PCP scheduled.

## 2020-06-20 ENCOUNTER — Other Ambulatory Visit: Payer: Self-pay | Admitting: Adult Health

## 2020-07-09 ENCOUNTER — Other Ambulatory Visit: Payer: 59

## 2020-07-09 DIAGNOSIS — Z20822 Contact with and (suspected) exposure to covid-19: Secondary | ICD-10-CM

## 2020-07-10 ENCOUNTER — Encounter: Payer: Self-pay | Admitting: Adult Health

## 2020-07-10 LAB — NOVEL CORONAVIRUS, NAA: SARS-CoV-2, NAA: DETECTED — AB

## 2020-07-10 LAB — SARS-COV-2, NAA 2 DAY TAT

## 2020-07-10 LAB — SPECIMEN STATUS REPORT

## 2020-07-11 ENCOUNTER — Other Ambulatory Visit: Payer: Self-pay | Admitting: Physician Assistant

## 2020-07-11 DIAGNOSIS — E663 Overweight: Secondary | ICD-10-CM

## 2020-07-11 DIAGNOSIS — U071 COVID-19: Secondary | ICD-10-CM

## 2020-07-11 DIAGNOSIS — E782 Mixed hyperlipidemia: Secondary | ICD-10-CM

## 2020-07-11 DIAGNOSIS — I10 Essential (primary) hypertension: Secondary | ICD-10-CM

## 2020-07-11 NOTE — Progress Notes (Signed)
I connected by phone with [facility] on 07/11/2020 at 9:34 AM to discuss the potential use of a new treatment for mild to moderate COVID-19 viral infection in non-hospitalized patients.  This patient is a 54 y.o. male that meets the FDA criteria for Emergency Use Authorization of COVID monoclonal antibody casirivimab/imdevimab or bamlanivimab/eteseviamb.  Has a (+) direct SARS-CoV-2 viral test result  Has mild or moderate COVID-19   Is NOT hospitalized due to COVID-19  Is within 10 days of symptom onset  Has at least one of the high risk factor(s) for progression to severe COVID-19 and/or hospitalization as defined in EUA.  Specific high risk criteria : BMI > 25 and Cardiovascular disease or hypertension   I have spoken and communicated the following to the patient or parent/caregiver regarding COVID monoclonal antibody treatment:  1. FDA has authorized the emergency use for the treatment of mild to moderate COVID-19 in adults and pediatric patients with positive results of direct SARS-CoV-2 viral testing who are 37 years of age and older weighing at least 40 kg, and who are at high risk for progressing to severe COVID-19 and/or hospitalization.  2. The significant known and potential risks and benefits of COVID monoclonal antibody, and the extent to which such potential risks and benefits are unknown.  3. Information on available alternative treatments and the risks and benefits of those alternatives, including clinical trials.  4. Patients treated with COVID monoclonal antibody should continue to self-isolate and use infection control measures (e.g., wear mask, isolate, social distance, avoid sharing personal items, clean and disinfect "high touch" surfaces, and frequent handwashing) according to CDC guidelines.   5. The patient or parent/caregiver has the option to accept or refuse COVID monoclonal antibody treatment.  After reviewing this information with the patient, the patient  has agreed to receive one of the available covid 19 monoclonal antibodies and will be provided an appropriate fact sheet prior to infusion.   [place], [place] 07/11/2020 9:34 AM

## 2020-07-12 ENCOUNTER — Ambulatory Visit (HOSPITAL_COMMUNITY)
Admission: RE | Admit: 2020-07-12 | Discharge: 2020-07-12 | Disposition: A | Discharge status: Home / Self Care | Payer: 59 | Source: Ambulatory Visit | Attending: Pulmonary Disease | Admitting: Pulmonary Disease

## 2020-07-12 DIAGNOSIS — U071 COVID-19: Secondary | ICD-10-CM | POA: Diagnosis not present

## 2020-07-12 MED ORDER — FAMOTIDINE IN NACL 20-0.9 MG/50ML-% IV SOLN: 20.0000 mg | Freq: Once | INTRAVENOUS | Status: DC | PRN

## 2020-07-12 MED ORDER — ALBUTEROL SULFATE HFA 108 (90 BASE) MCG/ACT IN AERS: 2.0000 | INHALATION_SPRAY | Freq: Once | RESPIRATORY_TRACT | Status: DC | PRN

## 2020-07-12 MED ORDER — METHYLPREDNISOLONE SODIUM SUCC 125 MG IJ SOLR: 125.0000 mg | Freq: Once | INTRAMUSCULAR | Status: DC | PRN

## 2020-07-12 MED ORDER — SODIUM CHLORIDE 0.9 % IV SOLN: INTRAVENOUS | Status: DC | PRN

## 2020-07-12 MED ORDER — EPINEPHRINE 0.3 MG/0.3ML IJ SOAJ: 0.3000 mg | Freq: Once | INTRAMUSCULAR | Status: DC | PRN

## 2020-07-12 MED ORDER — DIPHENHYDRAMINE HCL 50 MG/ML IJ SOLN: 50.0000 mg | Freq: Once | INTRAMUSCULAR | Status: DC | PRN

## 2020-07-12 MED ORDER — SOTROVIMAB 500 MG/8ML IV SOLN
500.0000 mg | Freq: Once | INTRAVENOUS | Status: AC
  Administered 2020-07-12: 500 mg via INTRAVENOUS

## 2020-07-12 NOTE — Discharge Instructions (Signed)

## 2020-07-12 NOTE — Progress Notes (Signed)
Patient's BP prior to and post infusion elevated. Patient has hx of HTN and currently takes medication but did not take his Antihypertension medication today. Patient denied any dizziness, headache or chest pain. Patient verbalized he will take his BP medication today

## 2020-07-12 NOTE — Progress Notes (Signed)
Diagnosis: COVID-19  Physician: Dr. Joya Gaskins  Procedure: [facility] Med: Sotrovimab infusion - Provided patient with Sotrovimab fact sheet for patients, parents and caregivers prior to infusion.   Complications: No immediate complications  Discharge: Discharged home

## 2020-07-19 ENCOUNTER — Encounter: Payer: Self-pay | Admitting: Adult Health

## 2020-07-19 ENCOUNTER — Other Ambulatory Visit: Payer: Self-pay | Admitting: Adult Health

## 2020-07-19 DIAGNOSIS — M25562 Pain in left knee: Secondary | ICD-10-CM

## 2020-07-19 DIAGNOSIS — G8929 Other chronic pain: Secondary | ICD-10-CM

## 2020-07-20 ENCOUNTER — Encounter: Payer: Self-pay | Admitting: Adult Health

## 2020-07-20 ENCOUNTER — Telehealth: Payer: 59 | Admitting: Adult Health

## 2020-07-20 DIAGNOSIS — J069 Acute upper respiratory infection, unspecified: Secondary | ICD-10-CM | POA: Diagnosis not present

## 2020-07-20 MED ORDER — AZITHROMYCIN 250 MG PO TABS: ORAL_TABLET | ORAL | 0 refills | Status: DC

## 2020-07-20 NOTE — Progress Notes (Signed)
Virtual Visit via Video Note  I connected with [facility] on 07/20/20 at  4:00 PM EST by a video enabled telemedicine application and verified that I am speaking with the correct person using two identifiers.  Location patient: home Location provider:work or home office Persons participating in the virtual visit: patient, provider  I discussed the limitations of evaluation and management by telemedicine and the availability of in person appointments. The patient expressed understanding and agreed to proceed.   HPI: 54 year old male who is being evaluated today for an acute issue of concern for sinusitis.  His symptoms started approximately 7-8 weeks ago and have not improved nor gotten any worse.  Symptoms include sinus pain and pressure, nasal congestion, productive cough.  He denies fevers or chills.  Recently was diagnosed with Covid and has had the monoclonal antibody infusion days ago.   ROS: See pertinent positives and negatives per HPI.  Past Medical History:  Diagnosis Date   ALLERGIC RHINITIS    ANXIETY    ASTHMA    BACK PAIN    ERECTILE DYSFUNCTION    GERD    HYPERLIPIDEMIA    HYPERTENSION    LUMBAR STRAIN    NECK PAIN    PANCREATITIS, HX OF    PONV (postoperative nausea and vomiting)    nausea only    Seasonal allergies    SHOULDER PAIN, LEFT    Substance abuse ([place])    quit in 2007   TENDINITIS, PATELLAR     Past Surgical History:  Procedure Laterality Date   IMAGE GUIDED SINUS SURGERY N/A 04/13/2015   Procedure: IMAGE GUIDED SINUS SURGERY;  Surgeon: Carloyn Manner, MD;  Location: [place];  Service: ENT;  Laterality: N/A;  GAVE DISK TO CE CE   KNEE ARTHROSCOPY Right 2008   KNEE SURGERY Left    x2   MAXILLARY ANTROSTOMY Bilateral 04/13/2015   Procedure: MAXILLARY ANTROSTOMY;  Surgeon: Carloyn Manner, MD;  Location: [place];  Service: ENT;  Laterality: Bilateral;    Family History  Problem Relation Age of Onset    Lung disease Father    Melanoma Mother    Heart attack Brother    Colon polyps Neg Hx    Colon cancer Neg Hx    Stomach cancer Neg Hx    Pancreatic cancer Neg Hx    Esophageal cancer Neg Hx        Current Outpatient Medications:    buPROPion (WELLBUTRIN SR) 100 MG 12 hr tablet, TAKE TWO TABLETS BY MOUTH EVERY MORNING, Disp: 180 tablet, Rfl: 0   cetirizine (ZYRTEC) 10 MG tablet, Take 10 mg by mouth daily as needed for allergies. AM, Disp: , Rfl:    cyclobenzaprine (FLEXERIL) 10 MG tablet, TAKE ONE TABLET BY MOUTH AT BEDTIME, Disp: 90 tablet, Rfl: 1   fluticasone (FLONASE) 50 MCG/ACT nasal spray, SPRAY 1 SPRAY INTO EACH NOSTRIL EVERY DAY, Disp: 48 g, Rfl: 1   losartan (COZAAR) 50 MG tablet, TAKE 1 TABLET BY MOUTH EVERY DAY, Disp: 90 tablet, Rfl: 2   lovastatin (MEVACOR) 20 MG tablet, TAKE 1 TABLET BY MOUTH EVERYDAY AT BEDTIME, Disp: 90 tablet, Rfl: 1   meloxicam (MOBIC) 15 MG tablet, TAKE ONE TABLET BY MOUTH DAILY AS NEEDED FOR PAIN, Disp: 90 tablet, Rfl: 0   methylPREDNISolone (MEDROL DOSEPAK) 4 MG TBPK tablet, Take as directed, Disp: 21 tablet, Rfl: 0   omeprazole (PRILOSEC) 20 MG capsule, TAKE ONE CAPSULE BY MOUTH DAILY, Disp: 90 capsule, Rfl: 1   sildenafil (  VIAGRA) 100 MG tablet, Take 1 tablet (100 mg total) by mouth daily as needed for erectile dysfunction., Disp: 15 tablet, Rfl: 3   traMADol (ULTRAM) 50 MG tablet, TAKE ONE TABLET BY MOUTH THREE TIMES A DAY AS NEEDED, Disp: 90 tablet, Rfl: 1  EXAM:  VITALS per patient if applicable:  GENERAL: alert, oriented, appears well and in no acute distress  HEENT: atraumatic, conjunttiva clear, no obvious abnormalities on inspection of external nose and ears  NECK: normal movements of the head and neck  LUNGS: on inspection no signs of respiratory distress, breathing rate appears normal, no obvious gross SOB, gasping or wheezing  CV: no obvious cyanosis  MS: moves all visible extremities without noticeable  abnormality  PSYCH/NEURO: pleasant and cooperative, no obvious depression or anxiety, speech and thought processing grossly intact  ASSESSMENT AND PLAN:  Discussed the following assessment and plan:  1. Upper respiratory tract infection, unspecified type  - azithromycin (ZITHROMAX) 250 MG tablet; Take two tabs on day 1 and then one tab on days 2-5  Dispense: 6 tablet; Refill: 0  - Follow up if no improvement in the next 2-3 days or sooner if symptoms worsen    I discussed the assessment and treatment plan with the patient. The patient was provided an opportunity to ask questions and all were answered. The patient agreed with the plan and demonstrated an understanding of the instructions.   The patient was advised to call back or seek an in-person evaluation if the symptoms worsen or if the condition fails to improve as anticipated.   Dorothyann Peng, NP

## 2020-07-26 ENCOUNTER — Encounter: Payer: Self-pay | Admitting: Adult Health

## 2020-07-26 ENCOUNTER — Other Ambulatory Visit: Payer: Self-pay | Admitting: Adult Health

## 2020-07-26 MED ORDER — DOXYCYCLINE HYCLATE 100 MG PO CAPS: 100.0000 mg | ORAL_CAPSULE | Freq: Two times a day (BID) | ORAL | 0 refills | Status: DC

## 2020-07-29 ENCOUNTER — Other Ambulatory Visit: Payer: Self-pay | Admitting: Adult Health

## 2020-09-11 ENCOUNTER — Other Ambulatory Visit: Payer: Self-pay | Admitting: Adult Health

## 2020-09-15 ENCOUNTER — Other Ambulatory Visit: Payer: Self-pay

## 2020-09-16 ENCOUNTER — Encounter: Payer: Self-pay | Admitting: Adult Health

## 2020-09-16 ENCOUNTER — Ambulatory Visit (INDEPENDENT_AMBULATORY_CARE_PROVIDER_SITE_OTHER): Payer: 59 | Admitting: Adult Health

## 2020-09-16 VITALS — BP 140/100 | Temp 97.8°F | Ht 74.0 in | Wt 218.0 lb

## 2020-09-16 DIAGNOSIS — E785 Hyperlipidemia, unspecified: Secondary | ICD-10-CM

## 2020-09-16 DIAGNOSIS — M25562 Pain in left knee: Secondary | ICD-10-CM

## 2020-09-16 DIAGNOSIS — M25561 Pain in right knee: Secondary | ICD-10-CM

## 2020-09-16 DIAGNOSIS — N529 Male erectile dysfunction, unspecified: Secondary | ICD-10-CM

## 2020-09-16 DIAGNOSIS — F329 Major depressive disorder, single episode, unspecified: Secondary | ICD-10-CM

## 2020-09-16 DIAGNOSIS — L600 Ingrowing nail: Secondary | ICD-10-CM

## 2020-09-16 DIAGNOSIS — I10 Essential (primary) hypertension: Secondary | ICD-10-CM

## 2020-09-16 DIAGNOSIS — Z125 Encounter for screening for malignant neoplasm of prostate: Secondary | ICD-10-CM

## 2020-09-16 DIAGNOSIS — G8929 Other chronic pain: Secondary | ICD-10-CM

## 2020-09-16 DIAGNOSIS — K219 Gastro-esophageal reflux disease without esophagitis: Secondary | ICD-10-CM

## 2020-09-16 DIAGNOSIS — Z0000 Encounter for general adult medical examination without abnormal findings: Secondary | ICD-10-CM

## 2020-09-16 LAB — COMPREHENSIVE METABOLIC PANEL
ALT: 78 U/L — ABNORMAL HIGH (ref 0–53)
AST: 39 U/L — ABNORMAL HIGH (ref 0–37)
Albumin: 4.8 g/dL (ref 3.5–5.2)
Alkaline Phosphatase: 81 U/L (ref 39–117)
BUN: 21 mg/dL (ref 6–23)
CO2: 31 mEq/L (ref 19–32)
Calcium: 10 mg/dL (ref 8.4–10.5)
Chloride: 100 mEq/L (ref 96–112)
Creatinine, Ser: 1.01 mg/dL (ref 0.40–1.50)
GFR: 84.32 mL/min (ref >60.00)
Glucose, Bld: 127 mg/dL — ABNORMAL HIGH (ref 70–99)
Potassium: 5.1 mEq/L (ref 3.5–5.1)
Sodium: 136 mEq/L (ref 135–145)
Total Bilirubin: 1.1 mg/dL (ref 0.2–1.2)
Total Protein: 6.9 g/dL (ref 6.0–8.3)

## 2020-09-16 LAB — CBC WITH DIFFERENTIAL/PLATELET
Basophils Absolute: 0 10*3/uL (ref 0.0–0.1)
Basophils Relative: 0.9 % (ref 0.0–3.0)
Eosinophils Absolute: 0.1 10*3/uL (ref 0.0–0.7)
Eosinophils Relative: 2.6 % (ref 0.0–5.0)
HCT: 47.9 % (ref 39.0–52.0)
Hemoglobin: 16.6 g/dL (ref 13.0–17.0)
Lymphocytes Relative: 24 % (ref 12.0–46.0)
Lymphs Abs: 1.2 10*3/uL (ref 0.7–4.0)
MCHC: 34.6 g/dL (ref 30.0–36.0)
MCV: 85.6 fl (ref 78.0–100.0)
Monocytes Absolute: 0.3 10*3/uL (ref 0.1–1.0)
Monocytes Relative: 5.4 % (ref 3.0–12.0)
Neutro Abs: 3.4 10*3/uL (ref 1.4–7.7)
Neutrophils Relative %: 67.1 % (ref 43.0–77.0)
Platelets: 194 10*3/uL (ref 150.0–400.0)
RBC: 5.6 Mil/uL (ref 4.22–5.81)
RDW: 13 % (ref 11.5–15.5)
WBC: 5.1 10*3/uL (ref 4.0–10.5)

## 2020-09-16 LAB — LIPID PANEL
Cholesterol: 209 mg/dL — ABNORMAL HIGH (ref 0–200)
HDL: 48.6 mg/dL (ref >39.00)
LDL Cholesterol: 138 mg/dL — ABNORMAL HIGH (ref 0–99)
NonHDL: 159.99
Total CHOL/HDL Ratio: 4
Triglycerides: 110 mg/dL (ref 0.0–149.0)
VLDL: 22 mg/dL (ref 0.0–40.0)

## 2020-09-16 LAB — TSH: TSH: 3.52 u[IU]/mL (ref 0.35–4.50)

## 2020-09-16 LAB — HEMOGLOBIN A1C: Hgb A1c MFr Bld: 6 % (ref 4.6–6.5)

## 2020-09-16 LAB — PSA: PSA: 1.61 ng/mL (ref 0.10–4.00)

## 2020-09-16 MED ORDER — LOSARTAN POTASSIUM 100 MG PO TABS: 100.0000 mg | ORAL_TABLET | Freq: Every day | ORAL | 3 refills | Status: DC

## 2020-09-16 NOTE — Patient Instructions (Addendum)
I am going to increase your losartan to 100 mg, send me a message in two weeks with your blood pressure readings   Podiatry will call you to schedule   We will follow up with you regarding your lab work

## 2020-09-16 NOTE — Progress Notes (Signed)
Subjective:    Patient ID: [name], male    DOB: Sep 20, 1965, 55 y.o.   MRN: [id number]  HPI Patient presents for yearly preventative medicine examination. He is a pleasant 55 year old male who  has a past medical history of ALLERGIC RHINITIS, ANXIETY, ASTHMA, BACK PAIN, ERECTILE DYSFUNCTION, GERD, HYPERLIPIDEMIA, HYPERTENSION, LUMBAR STRAIN, NECK PAIN, PANCREATITIS, HX OF, PONV (postoperative nausea and vomiting), Seasonal allergies, SHOULDER PAIN, LEFT, Substance abuse ([place]), and TENDINITIS, PATELLAR.  Essential Hypertension -takes Cozaar 50 mg daily.  He denies dizziness, lightheadedness, chest pain, shortness of breath. Has had two BP readings that have been elevated latetely. Does not check his BP at home  BP Readings from Last 3 Encounters:  09/16/20 (!) 140/100  07/12/20 (!) 144/106  09/10/19 130/88   Hyperlipidemia -currently prescribed lovastatin 20 mg.  He denies myalgia or fatigue Lab Results  Component Value Date   CHOL 176 09/10/2019   HDL 40.30 09/10/2019   LDLCALC 120 (H) 09/10/2019   LDLDIRECT 147.2 07/24/2012   TRIG 78.0 09/10/2019   CHOLHDL 4 09/10/2019   GERD -takes Prilosec 20 mg daily and feels well controlled on this medication  Depression-controlled with Wellbutrin 200 mg daily.  He denies depressive symptoms or suicidal ideation  Chronic knee pain due to osteoarthritis-pain controlled with tramadol 50 mg 3 times daily as needed  ED-uses Viagra as needed  Ingrown toenail -reports that he has pain in his right great toe due to an ingrown toenail. He would like to have this cut out.    All immunizations and health maintenance protocols were reviewed with the patient and needed orders were placed.  Appropriate screening laboratory values were ordered for the patient including screening of hyperlipidemia, renal function and hepatic function. If indicated by BPH, a PSA was ordered.  Medication reconciliation,  past medical history, social history,  problem list and allergies were reviewed in detail with the patient  Goals were established with regard to weight loss, exercise, and  diet in compliance with medications.  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  He does not follow a specific diet but does eat healthy Wt Readings from Last 3 Encounters:  09/16/20 218 lb (98.9 kg)  09/10/19 221 lb (100.2 kg)  04/03/19 219 lb (99.3 kg)   Review of Systems  Constitutional: Negative.   HENT: Negative.   Eyes: Negative.   Respiratory: Negative.   Cardiovascular: Negative.   Gastrointestinal: Negative.   Endocrine: Negative.   Genitourinary: Negative.   Musculoskeletal: Positive for arthralgias and back pain.  Skin: Negative.   Allergic/Immunologic: Negative.   Neurological: Negative.   Hematological: Negative.   Psychiatric/Behavioral: Negative.   All other systems reviewed and are negative.  Past Medical History:  Diagnosis Date  . ALLERGIC RHINITIS   . ANXIETY   . ASTHMA   . BACK PAIN   . ERECTILE DYSFUNCTION   . GERD   . HYPERLIPIDEMIA   . HYPERTENSION   . LUMBAR STRAIN   . NECK PAIN   . PANCREATITIS, HX OF   . PONV (postoperative nausea and vomiting)    nausea only   . Seasonal allergies   . SHOULDER PAIN, LEFT   . Substance abuse ([place])    quit in 2007  . TENDINITIS, PATELLAR     Social History   Socioeconomic History  . Marital status: Married    Spouse name: Not on file  . Number of children: Not on file  . Years of education: Not on file  .  Highest education level: Not on file  Occupational History  . Not on file  Tobacco Use  . Smoking status: Never Smoker  . Smokeless tobacco: Current User    Types: Chew  . Tobacco comment: form given 11/24/15  Vaping Use  . Vaping Use: Never used  Substance and Sexual Activity  . Alcohol use: No    Alcohol/week: 0.0 standard drinks    Comment: hx alcohol abuse  . Drug use: No  . Sexual activity: Not on file  Other Topics Concern  . Not on file  Social History  Narrative   Works as a Games developer   Married to [place] Determinants of Radio broadcast assistant Strain: Not on [employer] Insecurity: Not on file  Transportation Needs: Not on file  Physical Activity: Not on file  Stress: Not on file  Social Connections: Not on file  Intimate Partner Violence: Not on file    Past Surgical History:  Procedure Laterality Date  . IMAGE GUIDED SINUS SURGERY N/A 04/13/2015   Procedure: IMAGE GUIDED SINUS SURGERY;  Surgeon: Carloyn Manner, MD;  Location: [place];  Service: ENT;  Laterality: N/A;  GAVE DISK TO CE CE  . KNEE ARTHROSCOPY Right 2008  . KNEE SURGERY Left    x2  . MAXILLARY ANTROSTOMY Bilateral 04/13/2015   Procedure: MAXILLARY ANTROSTOMY;  Surgeon: Carloyn Manner, MD;  Location: [facility];  Service: ENT;  Laterality: Bilateral;    Family History  Problem Relation Age of Onset  . Lung disease Father   . Melanoma Mother   . Heart attack Brother   . Colon polyps Neg Hx   . Colon cancer Neg Hx   . Stomach cancer Neg Hx   . Pancreatic cancer Neg Hx   . Esophageal cancer Neg Hx     Allergies  Allergen Reactions  . Aspirin Nausea And Vomiting    Current Outpatient Medications on File Prior to Visit  Medication Sig Dispense Refill  . buPROPion (WELLBUTRIN SR) 100 MG 12 hr tablet TAKE TWO TABLETS BY MOUTH EVERY MORNING 180 tablet 0  . cetirizine (ZYRTEC) 10 MG tablet Take 10 mg by mouth daily as needed for allergies. AM    . cyclobenzaprine (FLEXERIL) 10 MG tablet TAKE ONE TABLET BY MOUTH AT BEDTIME 90 tablet 1  . fluticasone (FLONASE) 50 MCG/ACT nasal spray SPRAY 1 SPRAY INTO EACH NOSTRIL EVERY DAY 48 g 1  . lovastatin (MEVACOR) 20 MG tablet TAKE 1 TABLET BY MOUTH EVERYDAY AT BEDTIME 90 tablet 1  . meloxicam (MOBIC) 15 MG tablet TAKE ONE TABLET BY MOUTH DAILY AS NEEDED FOR PAIN 90 tablet 0  . omeprazole (PRILOSEC) 20 MG capsule TAKE ONE CAPSULE BY MOUTH DAILY 90 capsule 1  . sildenafil  (VIAGRA) 100 MG tablet Take 1 tablet (100 mg total) by mouth daily as needed for erectile dysfunction. 15 tablet 3  . traMADol (ULTRAM) 50 MG tablet TAKE ONE TABLET BY MOUTH THREE TIMES A DAY AS NEEDED 90 tablet 1   No current facility-administered medications on file prior to visit.    BP (!) 140/100   Temp 97.8 F (36.6 C)   Ht 6\' 2"  (1.88 m) Comment: WITH SHOES  Wt 218 lb (98.9 kg)   BMI 27.99 kg/m       Objective:   Physical Exam Vitals and nursing note reviewed.  Constitutional:      General: He is not in acute distress.  Appearance: Normal appearance. He is well-developed and normal weight.  HENT:     Head: Normocephalic and atraumatic.     Right Ear: Tympanic membrane, ear canal and external ear normal. There is no impacted cerumen.     Left Ear: Tympanic membrane, ear canal and external ear normal. There is no impacted cerumen.     Nose: Nose normal. No congestion or rhinorrhea.     Mouth/Throat:     Mouth: Mucous membranes are moist.     Pharynx: Oropharynx is clear. No oropharyngeal exudate or posterior oropharyngeal erythema.  Eyes:     General:        Right eye: No discharge.        Left eye: No discharge.     Extraocular Movements: Extraocular movements intact.     Conjunctiva/sclera: Conjunctivae normal.     Pupils: Pupils are equal, round, and reactive to light.  Neck:     Vascular: No carotid bruit.     Trachea: No tracheal deviation.  Cardiovascular:     Rate and Rhythm: Normal rate and regular rhythm.     Pulses: Normal pulses.     Heart sounds: Normal heart sounds. No murmur heard. No friction rub. No gallop.   Pulmonary:     Effort: Pulmonary effort is normal. No respiratory distress.     Breath sounds: Normal breath sounds. No stridor. No wheezing, rhonchi or rales.  Chest:     Chest wall: No tenderness.  Abdominal:     General: Bowel sounds are normal. There is no distension.     Palpations: Abdomen is soft. There is no mass.      Tenderness: There is no abdominal tenderness. There is no right CVA tenderness, left CVA tenderness, guarding or rebound.     Hernia: No hernia is present.  Musculoskeletal:        General: No swelling, tenderness, deformity or signs of injury. Normal range of motion.     Right lower leg: No edema.     Left lower leg: No edema.  Lymphadenopathy:     Cervical: No cervical adenopathy.  Skin:    General: Skin is warm and dry.     Capillary Refill: Capillary refill takes less than 2 seconds.     Coloration: Skin is not jaundiced or pale.     Findings: No bruising, erythema, lesion or rash.     Comments: Ingrown toenail on right great toe  Neurological:     General: No focal deficit present.     Mental Status: He is alert and oriented to person, place, and time.     Cranial Nerves: No cranial nerve deficit.     Sensory: No sensory deficit.     Motor: No weakness.     Coordination: Coordination normal.     Gait: Gait normal.     Deep Tendon Reflexes: Reflexes normal.  Psychiatric:        Mood and Affect: Mood normal.        Behavior: Behavior normal.        Thought Content: Thought content normal.        Judgment: Judgment normal.       Assessment & Plan:  1. Routine general medical examination at a health care facility  - CBC with Differential/Platelet; Future - Comprehensive metabolic panel; Future - Hemoglobin A1c; Future - Lipid panel; Future - TSH; Future - CBC with Differential/Platelet - Comprehensive metabolic panel - Hemoglobin A1c - Lipid panel - TSH  2. Essential  hypertension -Increase Cozaar to 100 mg daily.  He was advised to monitor his blood pressure at home and send me a MyChart message in 2-week with his readings - CBC with Differential/Platelet; Future - Comprehensive metabolic panel; Future - Hemoglobin A1c; Future - Lipid panel; Future - TSH; Future - losartan (COZAAR) 100 MG tablet; Take 1 tablet (100 mg total) by mouth daily.  Dispense: 90 tablet;  Refill: 3 - CBC with Differential/Platelet - Comprehensive metabolic panel - Hemoglobin A1c - Lipid panel - TSH  3. Hyperlipidemia, unspecified hyperlipidemia type - Consider increase in statin  - CBC with Differential/Platelet; Future - Comprehensive metabolic panel; Future - Hemoglobin A1c; Future - Lipid panel; Future - TSH; Future - CBC with Differential/Platelet - Comprehensive metabolic panel - Hemoglobin A1c - Lipid panel - TSH  4. Reactive depression - Continue with Wellbutrin   5. Chronic pain of both knees - Continue with Tramadol   6. Prostate cancer screening  - PSA; Future - PSA  7. Erectile dysfunction, unspecified erectile dysfunction type - Continue with viagra PRN   8. Gastroesophageal reflux disease without esophagitis - Continue with prilosec  9. Ingrown toenail of right foot  - Ambulatory referral to [place]

## 2020-09-20 ENCOUNTER — Other Ambulatory Visit: Payer: Self-pay | Admitting: Family Medicine

## 2020-09-20 DIAGNOSIS — R7989 Other specified abnormal findings of blood chemistry: Secondary | ICD-10-CM

## 2020-09-20 MED ORDER — LOVASTATIN 40 MG PO TABS: 40.0000 mg | ORAL_TABLET | Freq: Every day | ORAL | 3 refills | Status: DC

## 2020-09-20 MED ORDER — LEVOTHYROXINE SODIUM 75 MCG PO TABS: 75.0000 ug | ORAL_TABLET | Freq: Every day | ORAL | 1 refills | Status: DC

## 2020-09-22 ENCOUNTER — Ambulatory Visit: Payer: Self-pay | Admitting: Podiatry

## 2020-09-27 ENCOUNTER — Other Ambulatory Visit: Payer: Self-pay | Admitting: Adult Health

## 2020-09-27 DIAGNOSIS — G8929 Other chronic pain: Secondary | ICD-10-CM

## 2020-09-27 DIAGNOSIS — M25562 Pain in left knee: Secondary | ICD-10-CM

## 2020-09-30 ENCOUNTER — Encounter: Payer: Self-pay | Admitting: Podiatry

## 2020-09-30 ENCOUNTER — Ambulatory Visit: Payer: 59 | Admitting: Podiatry

## 2020-09-30 ENCOUNTER — Other Ambulatory Visit: Payer: Self-pay

## 2020-09-30 DIAGNOSIS — L600 Ingrowing nail: Secondary | ICD-10-CM | POA: Diagnosis not present

## 2020-09-30 NOTE — Patient Instructions (Signed)

## 2020-09-30 NOTE — Progress Notes (Signed)
Subjective:   Patient ID: [name], male   DOB: 55 y.o.   MRN: [id number]   HPI Patient states he has had chronic ingrown toenail of his right big nail and his fifth nailbed it made it hard for him to walk with the fifth nail being thickened and he has trouble wearing shoe gear.  Patient states the left also has a crack in it but is not hurting as much and the right 1 has increasingly made it difficult to wear shoe gear.  Patient does not smoke likes to be active   Review of Systems  All other systems reviewed and are negative.       Objective:  Physical Exam Vitals and nursing note reviewed.  Constitutional:      Appearance: He is well-developed and well-nourished.  Cardiovascular:     Pulses: Intact distal pulses.  Pulmonary:     Effort: Pulmonary effort is normal.  Musculoskeletal:        General: Normal range of motion.  Skin:    General: Skin is warm.  Neurological:     Mental Status: He is alert.     Neurovascular status intact muscle strength is found to be adequate range of motion found to be adequate.  Patient is found to have incurvated right hallux medial border painful when pressed making shoe gear difficult and is found to have a thickened deformed right fifth nail that is painful when pressed from a dorsal direction.  There is no drainage associated with either these or redness.  The left shows mild ingrown toenail formation but not to the same degree     Assessment:  Ingrown toenail deformity right hallux and the entire fifth nail with mild deformity also on the left     Plan:  H&P reviewed condition and were going to focus on the right at this time.  I recommended removal of the nail border right hallux and removal of the fifth nail and I allowed patient to read and then signed consent form understanding risk.  I infiltrated the 2 toes each with 60 mg like Marcaine mixture sterile prep done and using sterile instrumentation I remove the medial border of the  right hallux and the fifth nail I exposed matrix and applied phenol 3 applications to the right hallux for applications to the right fifth toe and then sterile dressing.  I gave instructions on soaks and to leave dressings on 24 hours but take them off earlier if needed and I gave instructions on recovery and patient is encouraged to call questions concerns which may arise

## 2020-10-07 ENCOUNTER — Other Ambulatory Visit: Payer: Self-pay

## 2020-10-07 ENCOUNTER — Ambulatory Visit: Payer: 59 | Admitting: Podiatry

## 2020-10-07 ENCOUNTER — Encounter: Payer: Self-pay | Admitting: Podiatry

## 2020-10-07 DIAGNOSIS — L600 Ingrowing nail: Secondary | ICD-10-CM

## 2020-10-09 ENCOUNTER — Other Ambulatory Visit: Payer: Self-pay | Admitting: Adult Health

## 2020-10-09 DIAGNOSIS — R1013 Epigastric pain: Secondary | ICD-10-CM

## 2020-10-09 NOTE — Progress Notes (Signed)
Subjective:   Patient ID: [name], male   DOB: 55 y.o.   MRN: [id number]   HPI Patient presents stating that he has had some redness in his big toe and he wanted to have it looked at just to make sure everything was okay points to the right fifth toe   ROS      Objective:  Physical Exam  Neurovascular status intact with patient's right fifth nail showing slight redness but no active drainage no proximal edema erythema or drainage noted     Assessment:  Damaged nail bed after having had nail removal     Plan:  I advised that this is normal and I advised on soaks to be continued and if any further redness or drainage were to occur to let us know

## 2020-10-14 ENCOUNTER — Other Ambulatory Visit: Payer: Self-pay | Admitting: Adult Health

## 2020-10-18 ENCOUNTER — Encounter: Payer: Self-pay | Admitting: Adult Health

## 2020-10-18 ENCOUNTER — Other Ambulatory Visit: Payer: Self-pay | Admitting: Adult Health

## 2020-10-18 MED ORDER — HYDROCORTISONE (PERIANAL) 2.5 % EX CREA: 1.0000 "application " | TOPICAL_CREAM | Freq: Two times a day (BID) | CUTANEOUS | 3 refills | Status: DC

## 2020-10-18 NOTE — Telephone Encounter (Signed)
Denied.  Filled for 2 months on 09/20/20.  Pt will be having lab work to confirm this is the correct dose.

## 2020-10-21 ENCOUNTER — Other Ambulatory Visit: Payer: Self-pay

## 2020-10-21 ENCOUNTER — Other Ambulatory Visit (INDEPENDENT_AMBULATORY_CARE_PROVIDER_SITE_OTHER): Payer: 59

## 2020-10-21 ENCOUNTER — Other Ambulatory Visit: Payer: Self-pay | Admitting: Adult Health

## 2020-10-21 DIAGNOSIS — R7989 Other specified abnormal findings of blood chemistry: Secondary | ICD-10-CM

## 2020-10-21 LAB — TSH: TSH: 3.25 u[IU]/mL (ref 0.35–4.50)

## 2020-10-21 MED ORDER — LEVOTHYROXINE SODIUM 75 MCG PO TABS: 75.0000 ug | ORAL_TABLET | Freq: Every day | ORAL | 3 refills | Status: DC

## 2020-12-21 ENCOUNTER — Other Ambulatory Visit: Payer: Self-pay | Admitting: Adult Health

## 2021-01-17 ENCOUNTER — Other Ambulatory Visit: Payer: Self-pay | Admitting: Adult Health

## 2021-01-17 DIAGNOSIS — G8929 Other chronic pain: Secondary | ICD-10-CM

## 2021-03-08 ENCOUNTER — Other Ambulatory Visit: Payer: Self-pay | Admitting: Family Medicine

## 2021-03-08 DIAGNOSIS — N529 Male erectile dysfunction, unspecified: Secondary | ICD-10-CM

## 2021-03-28 ENCOUNTER — Other Ambulatory Visit: Payer: Self-pay | Admitting: Adult Health

## 2021-05-12 ENCOUNTER — Other Ambulatory Visit: Payer: Self-pay | Admitting: Adult Health

## 2021-05-12 DIAGNOSIS — G8929 Other chronic pain: Secondary | ICD-10-CM

## 2021-05-12 DIAGNOSIS — M25561 Pain in right knee: Secondary | ICD-10-CM

## 2021-05-30 ENCOUNTER — Telehealth (INDEPENDENT_AMBULATORY_CARE_PROVIDER_SITE_OTHER): Payer: 59 | Admitting: Adult Health

## 2021-05-30 ENCOUNTER — Encounter: Payer: Self-pay | Admitting: Adult Health

## 2021-05-30 VITALS — Ht 74.0 in | Wt 210.0 lb

## 2021-05-30 DIAGNOSIS — J0140 Acute pansinusitis, unspecified: Secondary | ICD-10-CM

## 2021-05-30 NOTE — Progress Notes (Signed)
Virtual Visit via Video Note  I connected with [facility] on 05/30/21 at  5:00 PM EDT by a video enabled telemedicine application and verified that I am speaking with the correct person using two identifiers.  Location patient: home Location provider:work or home office Persons participating in the virtual visit: patient, provider  I discussed the limitations of evaluation and management by telemedicine and the availability of in person appointments. The patient expressed understanding and agreed to proceed.   HPI: 55 year old male who is being evaluated today for an acute issue.  Symptoms started roughly a week ago.   Symptoms include nasal congestion, ear pain, sinus pain and pressure, productive cough with gray phlegm, diarrhea, fatigue, and body aches.  He reports that the fatigue and body aches started earlier today.  He denies fevers or chills.  His wife has some mild nasal congestion as well.  Did a at home COVID test which was negative    ROS: See pertinent positives and negatives per HPI.  Past Medical History:  Diagnosis Date   ALLERGIC RHINITIS    ANXIETY    ASTHMA    BACK PAIN    ERECTILE DYSFUNCTION    GERD    HYPERLIPIDEMIA    HYPERTENSION    LUMBAR STRAIN    NECK PAIN    PANCREATITIS, HX OF    PONV (postoperative nausea and vomiting)    nausea only    Seasonal allergies    SHOULDER PAIN, LEFT    Substance abuse ([place])    quit in 2007   TENDINITIS, PATELLAR     Past Surgical History:  Procedure Laterality Date   IMAGE GUIDED SINUS SURGERY N/A 04/13/2015   Procedure: IMAGE GUIDED SINUS SURGERY;  Surgeon: Carloyn Manner, MD;  Location: [facility];  Service: ENT;  Laterality: N/A;  GAVE DISK TO CE CE   KNEE ARTHROSCOPY Right 2008   KNEE SURGERY Left    x2   MAXILLARY ANTROSTOMY Bilateral 04/13/2015   Procedure: MAXILLARY ANTROSTOMY;  Surgeon: Carloyn Manner, MD;  Location: [facility];  Service: ENT;  Laterality: Bilateral;    Family  History  Problem Relation Age of Onset   Lung disease Father    Melanoma Mother    Heart attack Brother    Colon polyps Neg Hx    Colon cancer Neg Hx    Stomach cancer Neg Hx    Pancreatic cancer Neg Hx    Esophageal cancer Neg Hx        Current Outpatient Medications:    buPROPion ER (WELLBUTRIN SR) 100 MG 12 hr tablet, TAKE TWO TABLETS BY MOUTH EVERY MORNING, Disp: 180 tablet, Rfl: 1   cetirizine (ZYRTEC) 10 MG tablet, Take 10 mg by mouth daily as needed for allergies. AM, Disp: , Rfl:    cyclobenzaprine (FLEXERIL) 10 MG tablet, TAKE ONE TABLET BY MOUTH AT BEDTIME, Disp: 90 tablet, Rfl: 1   fluticasone (FLONASE) 50 MCG/ACT nasal spray, SPRAY 1 SPRAY INTO EACH NOSTRIL EVERY DAY, Disp: 48 g, Rfl: 1   hydrocortisone (ANUSOL-HC) 2.5 % rectal cream, Place 1 application rectally 2 (two) times daily., Disp: 30 g, Rfl: 3   levothyroxine (SYNTHROID) 75 MCG tablet, Take 1 tablet (75 mcg total) by mouth daily before breakfast., Disp: 90 tablet, Rfl: 3   losartan (COZAAR) 100 MG tablet, Take 1 tablet (100 mg total) by mouth daily., Disp: 90 tablet, Rfl: 3   lovastatin (MEVACOR) 40 MG tablet, Take 1 tablet (40 mg total) by mouth at bedtime.,  Disp: 90 tablet, Rfl: 3   meloxicam (MOBIC) 15 MG tablet, TAKE ONE TABLET BY MOUTH DAILY AS NEEDED FOR PAIN, Disp: 90 tablet, Rfl: 1   omeprazole (PRILOSEC) 20 MG capsule, TAKE ONE CAPSULE BY MOUTH DAILY, Disp: 90 capsule, Rfl: 3   sildenafil (VIAGRA) 100 MG tablet, TAKE ONE TABLET BY MOUTH DAILY AS NEEDED FOR ERECTILE DYSFUNCTION, Disp: 15 tablet, Rfl: 3   traMADol (ULTRAM) 50 MG tablet, TAKE ONE TABLET BY MOUTH THREE TIMES A DAY AS NEEDED, Disp: 90 tablet, Rfl: 2  EXAM:  VITALS per patient if applicable:  GENERAL: alert, oriented, appears well and in no acute distress  HEENT: atraumatic, conjunttiva clear, no obvious abnormalities on inspection of external nose and ears  NECK: normal movements of the head and neck  LUNGS: on inspection no signs of  respiratory distress, breathing rate appears normal, no obvious gross SOB, gasping or wheezing  CV: no obvious cyanosis  MS: moves all visible extremities without noticeable abnormality  PSYCH/NEURO: pleasant and cooperative, no obvious depression or anxiety, speech and thought processing grossly intact  ASSESSMENT AND PLAN:  Discussed the following assessment and plan:  1. Acute non-recurrent pansinusitis  - doxycycline (VIBRAMYCIN) 100 MG capsule; Take 1 capsule (100 mg total) by mouth 2 (two) times daily.  Dispense: 14 capsule; Refill: 0   I discussed the assessment and treatment plan with the patient. The patient was provided an opportunity to ask questions and all were answered. The patient agreed with the plan and demonstrated an understanding of the instructions.   The patient was advised to call back or seek an in-person evaluation if the symptoms worsen or if the condition fails to improve as anticipated.   Dorothyann Peng, NP

## 2021-05-31 ENCOUNTER — Encounter: Payer: Self-pay | Admitting: Adult Health

## 2021-05-31 MED ORDER — DOXYCYCLINE HYCLATE 100 MG PO CAPS: 100.0000 mg | ORAL_CAPSULE | Freq: Two times a day (BID) | ORAL | 0 refills | Status: DC

## 2021-06-01 ENCOUNTER — Other Ambulatory Visit: Payer: Self-pay | Admitting: Adult Health

## 2021-06-01 ENCOUNTER — Encounter: Payer: Self-pay | Admitting: Adult Health

## 2021-06-01 MED ORDER — HYDROCODONE BIT-HOMATROP MBR 5-1.5 MG/5ML PO SOLN: 5.0000 mL | Freq: Three times a day (TID) | ORAL | 0 refills | Status: DC | PRN

## 2021-06-01 NOTE — Telephone Encounter (Signed)
Please advise pt had a virtual 9/20

## 2021-06-08 ENCOUNTER — Other Ambulatory Visit: Payer: Self-pay | Admitting: Adult Health

## 2021-06-08 MED ORDER — AMOXICILLIN-POT CLAVULANATE 875-125 MG PO TABS: 1.0000 | ORAL_TABLET | Freq: Two times a day (BID) | ORAL | 0 refills | Status: DC

## 2021-06-13 ENCOUNTER — Ambulatory Visit: Payer: Self-pay | Admitting: Adult Health

## 2021-06-16 ENCOUNTER — Other Ambulatory Visit: Payer: Self-pay | Admitting: Adult Health

## 2021-07-17 ENCOUNTER — Other Ambulatory Visit: Payer: Self-pay | Admitting: Adult Health

## 2021-08-13 ENCOUNTER — Other Ambulatory Visit: Payer: Self-pay | Admitting: Adult Health

## 2021-08-13 DIAGNOSIS — I10 Essential (primary) hypertension: Secondary | ICD-10-CM

## 2021-08-17 ENCOUNTER — Other Ambulatory Visit: Payer: Self-pay | Admitting: Adult Health

## 2021-09-18 ENCOUNTER — Other Ambulatory Visit: Payer: Self-pay | Admitting: Adult Health

## 2021-09-20 MED ORDER — CYCLOBENZAPRINE HCL 10 MG PO TABS: 10.0000 mg | ORAL_TABLET | Freq: Every day | ORAL | 0 refills | Status: DC

## 2021-09-25 ENCOUNTER — Other Ambulatory Visit: Payer: Self-pay | Admitting: Adult Health

## 2021-09-25 DIAGNOSIS — M25562 Pain in left knee: Secondary | ICD-10-CM

## 2021-09-25 DIAGNOSIS — G8929 Other chronic pain: Secondary | ICD-10-CM

## 2021-10-03 ENCOUNTER — Encounter: Payer: Self-pay | Admitting: Adult Health

## 2021-10-15 ENCOUNTER — Other Ambulatory Visit: Payer: Self-pay | Admitting: Adult Health

## 2021-10-15 DIAGNOSIS — R1013 Epigastric pain: Secondary | ICD-10-CM

## 2021-10-17 ENCOUNTER — Ambulatory Visit (INDEPENDENT_AMBULATORY_CARE_PROVIDER_SITE_OTHER): Payer: 59 | Admitting: Adult Health

## 2021-10-17 ENCOUNTER — Encounter: Payer: Self-pay | Admitting: Adult Health

## 2021-10-17 ENCOUNTER — Other Ambulatory Visit: Payer: Self-pay | Admitting: Adult Health

## 2021-10-17 VITALS — BP 120/88 | HR 75 | Temp 97.8°F | Ht 73.0 in | Wt 208.0 lb

## 2021-10-17 DIAGNOSIS — Z125 Encounter for screening for malignant neoplasm of prostate: Secondary | ICD-10-CM

## 2021-10-17 DIAGNOSIS — K2100 Gastro-esophageal reflux disease with esophagitis, without bleeding: Secondary | ICD-10-CM

## 2021-10-17 DIAGNOSIS — E785 Hyperlipidemia, unspecified: Secondary | ICD-10-CM | POA: Diagnosis not present

## 2021-10-17 DIAGNOSIS — N529 Male erectile dysfunction, unspecified: Secondary | ICD-10-CM

## 2021-10-17 DIAGNOSIS — Z23 Encounter for immunization: Secondary | ICD-10-CM | POA: Diagnosis not present

## 2021-10-17 DIAGNOSIS — Z0000 Encounter for general adult medical examination without abnormal findings: Secondary | ICD-10-CM

## 2021-10-17 DIAGNOSIS — I10 Essential (primary) hypertension: Secondary | ICD-10-CM | POA: Diagnosis not present

## 2021-10-17 DIAGNOSIS — F329 Major depressive disorder, single episode, unspecified: Secondary | ICD-10-CM | POA: Diagnosis not present

## 2021-10-17 DIAGNOSIS — Z1159 Encounter for screening for other viral diseases: Secondary | ICD-10-CM

## 2021-10-17 LAB — COMPREHENSIVE METABOLIC PANEL
ALT: 33 U/L (ref 0–53)
AST: 16 U/L (ref 0–37)
Albumin: 4.8 g/dL (ref 3.5–5.2)
Alkaline Phosphatase: 69 U/L (ref 39–117)
BUN: 26 mg/dL — ABNORMAL HIGH (ref 6–23)
CO2: 33 mEq/L — ABNORMAL HIGH (ref 19–32)
Calcium: 9.6 mg/dL (ref 8.4–10.5)
Chloride: 101 mEq/L (ref 96–112)
Creatinine, Ser: 0.91 mg/dL (ref 0.40–1.50)
GFR: 94.83 mL/min (ref >60.00)
Glucose, Bld: 110 mg/dL — ABNORMAL HIGH (ref 70–99)
Potassium: 4.7 mEq/L (ref 3.5–5.1)
Sodium: 137 mEq/L (ref 135–145)
Total Bilirubin: 0.8 mg/dL (ref 0.2–1.2)
Total Protein: 6.9 g/dL (ref 6.0–8.3)

## 2021-10-17 LAB — CBC WITH DIFFERENTIAL/PLATELET
Basophils Absolute: 0 10*3/uL (ref 0.0–0.1)
Basophils Relative: 0.7 % (ref 0.0–3.0)
Eosinophils Absolute: 0.1 10*3/uL (ref 0.0–0.7)
Eosinophils Relative: 1.2 % (ref 0.0–5.0)
HCT: 46.1 % (ref 39.0–52.0)
Hemoglobin: 15.8 g/dL (ref 13.0–17.0)
Lymphocytes Relative: 27.9 % (ref 12.0–46.0)
Lymphs Abs: 1.5 10*3/uL (ref 0.7–4.0)
MCHC: 34.3 g/dL (ref 30.0–36.0)
MCV: 85.1 fl (ref 78.0–100.0)
Monocytes Absolute: 0.3 10*3/uL (ref 0.1–1.0)
Monocytes Relative: 5.4 % (ref 3.0–12.0)
Neutro Abs: 3.6 10*3/uL (ref 1.4–7.7)
Neutrophils Relative %: 64.8 % (ref 43.0–77.0)
Platelets: 202 10*3/uL (ref 150.0–400.0)
RBC: 5.42 Mil/uL (ref 4.22–5.81)
RDW: 12.4 % (ref 11.5–15.5)
WBC: 5.5 10*3/uL (ref 4.0–10.5)

## 2021-10-17 LAB — LIPID PANEL
Cholesterol: 151 mg/dL (ref 0–200)
HDL: 43.3 mg/dL (ref >39.00)
LDL Cholesterol: 95 mg/dL (ref 0–99)
NonHDL: 107.63
Total CHOL/HDL Ratio: 3
Triglycerides: 62 mg/dL (ref 0.0–149.0)
VLDL: 12.4 mg/dL (ref 0.0–40.0)

## 2021-10-17 LAB — PSA: PSA: 1.73 ng/mL (ref 0.10–4.00)

## 2021-10-17 LAB — HEMOGLOBIN A1C: Hgb A1c MFr Bld: 6 % (ref 4.6–6.5)

## 2021-10-17 LAB — TSH: TSH: 2.73 u[IU]/mL (ref 0.35–5.50)

## 2021-10-17 MED ORDER — LEVOTHYROXINE SODIUM 75 MCG PO TABS: 75.0000 ug | ORAL_TABLET | Freq: Every day | ORAL | 3 refills | Status: DC

## 2021-10-17 MED ORDER — LOVASTATIN 40 MG PO TABS: 40.0000 mg | ORAL_TABLET | Freq: Every day | ORAL | 3 refills | Status: DC

## 2021-10-17 NOTE — Patient Instructions (Signed)
It was great seeing you today   We will follow up with you regarding your lab work   Please let me know if you need anything   

## 2021-10-17 NOTE — Progress Notes (Signed)
Subjective:    Patient ID: [name], male    DOB: 18-Oct-1965, 56 y.o.   MRN: [id number]  HPI Patient presents for yearly preventative medicine examination. He is a pleasant 56 year old male who  has a past medical history of ALLERGIC RHINITIS, ANXIETY, ASTHMA, BACK PAIN, ERECTILE DYSFUNCTION, GERD, HYPERLIPIDEMIA, HYPERTENSION, LUMBAR STRAIN, NECK PAIN, PANCREATITIS, HX OF, PONV (postoperative nausea and vomiting), Seasonal allergies, SHOULDER PAIN, LEFT, Substance abuse ([place]), and TENDINITIS, PATELLAR.  Hypertension-takes Cozaar 100 mg QHS.  He denies dizziness, lightheadedness, chest pain, shortness of breath.  Does not check his blood pressure at home. Denies dizziness, lightheadedness, blurred vision, or headaches.  BP Readings from Last 3 Encounters:  10/17/21 (!) 120/100  09/16/20 (!) 140/100  07/12/20 (!) 144/106   Hyperlipidemia -prescribed lovastatin 40 mg daily.  He denies myalgia or fatigue Lab Results  Component Value Date   CHOL 209 (H) 09/16/2020   HDL 48.60 09/16/2020   LDLCALC 138 (H) 09/16/2020   LDLDIRECT 147.2 07/24/2012   TRIG 110.0 09/16/2020   CHOLHDL 4 09/16/2020   GERD - takes prilosec 20 mg daily. Feels well controlled  Depression -well-controlled with Wellbutrin 200 mg daily.  Chronic knee pain due to osteoarthritis-pain controlled with tramadol 50 mg 3 times daily as needed and Mobic 15mg    ED-uses Viagra as needed  Hypothyroidism - well controlled on Synthroid 75 mcg daily   All immunizations and health maintenance protocols were reviewed with the patient and needed orders were placed.  Appropriate screening laboratory values were ordered for the patient including screening of hyperlipidemia, renal function and hepatic function. If indicated by BPH, a PSA was ordered.  Medication reconciliation,  past medical history, social history, problem list and allergies were reviewed in detail with the patient  Goals were established with regard to  weight loss, exercise, and  diet in compliance with medications.  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  Does try and eat a heart healthy diet   Review of Systems  Constitutional: Negative.   HENT: Negative.    Eyes: Negative.   Respiratory: Negative.    Cardiovascular: Negative.   Gastrointestinal: Negative.   Endocrine: Negative.   Genitourinary: Negative.   Musculoskeletal: Negative.   Skin: Negative.   Allergic/Immunologic: Negative.   Neurological: Negative.   Hematological: Negative.   Psychiatric/Behavioral: Negative.    All other systems reviewed and are negative.  Past Medical History:  Diagnosis Date   ALLERGIC RHINITIS    ANXIETY    ASTHMA    BACK PAIN    ERECTILE DYSFUNCTION    GERD    HYPERLIPIDEMIA    HYPERTENSION    LUMBAR STRAIN    NECK PAIN    PANCREATITIS, HX OF    PONV (postoperative nausea and vomiting)    nausea only    Seasonal allergies    SHOULDER PAIN, LEFT    Substance abuse ([place])    quit in 2007   TENDINITIS, PATELLAR     Social History   Socioeconomic History   Marital status: Married    Spouse name: Not on file   Number of children: Not on file   Years of education: Not on file   Highest education level: Not on file  Occupational History   Not on file  Tobacco Use   Smoking status: Never   Smokeless tobacco: Current    Types: Chew   Tobacco comments:    form given 11/24/15  Vaping Use   Vaping Use:  Never used  Substance and Sexual Activity   Alcohol use: No    Alcohol/week: 0.0 standard drinks    Comment: hx alcohol abuse   Drug use: No   Sexual activity: Not on file  Other Topics Concern   Not on file  Social History Narrative   Works as a Games developer   Married to [place] Determinants of Health   Financial Resource Strain: Not on file  Food Insecurity: Not on file  Transportation Needs: Not on file  Physical Activity: Not on file  Stress: Not on file  Social Connections: Not on file   Intimate Partner Violence: Not on file    Past Surgical History:  Procedure Laterality Date   IMAGE GUIDED SINUS SURGERY N/A 04/13/2015   Procedure: [place];  Surgeon: Carloyn Manner, MD;  Location: [facility];  Service: ENT;  Laterality: N/A;  GAVE DISK TO CE CE   KNEE ARTHROSCOPY Right 2008   KNEE SURGERY Left    x2   MAXILLARY ANTROSTOMY Bilateral 04/13/2015   Procedure: MAXILLARY ANTROSTOMY;  Surgeon: Carloyn Manner, MD;  Location: [facility];  Service: ENT;  Laterality: Bilateral;    Family History  Problem Relation Age of Onset   Lung disease Father    Melanoma Mother    Heart attack Brother    Colon polyps Neg Hx    Colon cancer Neg Hx    Stomach cancer Neg Hx    Pancreatic cancer Neg Hx    Esophageal cancer Neg Hx     Allergies  Allergen Reactions   Aspirin Nausea And Vomiting    Current Outpatient Medications on File Prior to Visit  Medication Sig Dispense Refill   amoxicillin-clavulanate (AUGMENTIN) 875-125 MG tablet Take 1 tablet by mouth 2 (two) times daily. 20 tablet 0   buPROPion ER (WELLBUTRIN SR) 100 MG 12 hr tablet TAKE TWO TABLETS BY MOUTH EVERY MORNING 180 tablet 1   cetirizine (ZYRTEC) 10 MG tablet Take 10 mg by mouth daily as needed for allergies. AM     cyclobenzaprine (FLEXERIL) 10 MG tablet Take 1 tablet (10 mg total) by mouth at bedtime. 90 tablet 0   fluticasone (FLONASE) 50 MCG/ACT nasal spray SPRAY 1 SPRAY INTO EACH NOSTRIL EVERY DAY 48 g 1   hydrocortisone (ANUSOL-HC) 2.5 % rectal cream Place 1 application rectally 2 (two) times daily. 30 g 3   levothyroxine (SYNTHROID) 75 MCG tablet Take 1 tablet (75 mcg total) by mouth daily before breakfast. 90 tablet 3   losartan (COZAAR) 100 MG tablet TAKE ONE TABLET BY MOUTH DAILY 90 tablet 3   lovastatin (MEVACOR) 40 MG tablet Take 1 tablet (40 mg total) by mouth at bedtime. 90 tablet 3   meloxicam (MOBIC) 15 MG tablet TAKE ONE TABLET BY MOUTH DAILY AS NEEDED FOR PAIN  90 tablet 1   omeprazole (PRILOSEC) 20 MG capsule TAKE ONE CAPSULE BY MOUTH DAILY 90 capsule 3   sildenafil (VIAGRA) 100 MG tablet TAKE ONE TABLET BY MOUTH DAILY AS NEEDED FOR ERECTILE DYSFUNCTION 15 tablet 3   traMADol (ULTRAM) 50 MG tablet TAKE ONE TABLET BY MOUTH THREE TIMES A DAY AS NEEDED 90 tablet 2   No current facility-administered medications on file prior to visit.    BP (!) 120/100    Pulse 75    Temp 97.8 F (36.6 C) (Oral)    Ht 6\' 1"  (1.854 m)    Wt 208 lb (94.3 kg)  SpO2 99%    BMI 27.44 kg/m        Objective:   Physical Exam Vitals and nursing note reviewed.  Constitutional:      General: He is not in acute distress.    Appearance: Normal appearance. He is well-developed and normal weight.  HENT:     Head: Normocephalic and atraumatic.     Right Ear: Tympanic membrane, ear canal and external ear normal. There is no impacted cerumen.     Left Ear: Tympanic membrane, ear canal and external ear normal. There is no impacted cerumen.     Nose: Nose normal. No congestion or rhinorrhea.     Mouth/Throat:     Mouth: Mucous membranes are moist.     Pharynx: Oropharynx is clear. No oropharyngeal exudate or posterior oropharyngeal erythema.  Eyes:     General:        Right eye: No discharge.        Left eye: No discharge.     Extraocular Movements: Extraocular movements intact.     Conjunctiva/sclera: Conjunctivae normal.     Pupils: Pupils are equal, round, and reactive to light.  Neck:     Vascular: No carotid bruit.     Trachea: No tracheal deviation.  Cardiovascular:     Rate and Rhythm: Normal rate and regular rhythm.     Pulses: Normal pulses.     Heart sounds: Normal heart sounds. No murmur heard.   No friction rub. No gallop.  Pulmonary:     Effort: Pulmonary effort is normal. No respiratory distress.     Breath sounds: Normal breath sounds. No stridor. No wheezing, rhonchi or rales.  Chest:     Chest wall: No tenderness.  Abdominal:     General: Bowel  sounds are normal. There is no distension.     Palpations: Abdomen is soft. There is no mass.     Tenderness: There is no abdominal tenderness. There is no right CVA tenderness, left CVA tenderness, guarding or rebound.     Hernia: No hernia is present.  Musculoskeletal:        General: No swelling, tenderness, deformity or signs of injury. Normal range of motion.     Right lower leg: No edema.     Left lower leg: No edema.  Lymphadenopathy:     Cervical: No cervical adenopathy.  Skin:    General: Skin is warm and dry.     Capillary Refill: Capillary refill takes less than 2 seconds.     Coloration: Skin is not jaundiced or pale.     Findings: No bruising, erythema, lesion or rash.  Neurological:     General: No focal deficit present.     Mental Status: He is alert and oriented to person, place, and time.     Cranial Nerves: No cranial nerve deficit.     Sensory: No sensory deficit.     Motor: No weakness.     Coordination: Coordination normal.     Gait: Gait normal.     Deep Tendon Reflexes: Reflexes normal.  Psychiatric:        Mood and Affect: Mood normal.        Behavior: Behavior normal.        Thought Content: Thought content normal.        Judgment: Judgment normal.      Assessment & Plan:  1. Routine general medical examination at a health care facility - Continue to stay active and eat healthy  -  Follow up in one year or sooner if needed  - CBC with Differential/Platelet; Future - Comprehensive metabolic panel; Future - Hemoglobin A1c; Future - Lipid panel; Future - TSH; Future  2. Essential hypertension - Well controlled.  - CBC with Differential/Platelet; Future - Comprehensive metabolic panel; Future - Hemoglobin A1c; Future - Lipid panel; Future - TSH; Future  3. Hyperlipidemia, unspecified hyperlipidemia type - Consider change to higher intensity statin  - CBC with Differential/Platelet; Future - Comprehensive metabolic panel; Future - Hemoglobin  A1c; Future - Lipid panel; Future - TSH; Future  4. Reactive depression - Continue with Wellbutrin - CBC with Differential/Platelet; Future - Comprehensive metabolic panel; Future - Hemoglobin A1c; Future - Lipid panel; Future - TSH; Future  5. Prostate cancer screening  - PSA; Future  6. Erectile dysfunction, unspecified erectile dysfunction type - Continue Viagra   7. Gastroesophageal reflux disease with esophagitis without hemorrhage - Continue PPI PRN   8. Need for hepatitis C screening test  - Hep C Antibody; Future  9. Need for shingles vaccine  - Varicella-zoster vaccine IM  Dorothyann Peng, NP

## 2021-10-18 LAB — HEPATITIS C ANTIBODY
Hepatitis C Ab: NONREACTIVE
SIGNAL TO CUT-OFF: <0.02 (ref <1.00)

## 2021-10-21 ENCOUNTER — Other Ambulatory Visit: Payer: Self-pay | Admitting: Adult Health

## 2021-10-21 DIAGNOSIS — R1013 Epigastric pain: Secondary | ICD-10-CM

## 2021-11-07 ENCOUNTER — Ambulatory Visit: Payer: 59 | Admitting: Adult Health

## 2021-11-09 ENCOUNTER — Ambulatory Visit (INDEPENDENT_AMBULATORY_CARE_PROVIDER_SITE_OTHER): Payer: 59

## 2021-11-09 ENCOUNTER — Encounter: Payer: Self-pay | Admitting: Physician Assistant

## 2021-11-09 ENCOUNTER — Ambulatory Visit (INDEPENDENT_AMBULATORY_CARE_PROVIDER_SITE_OTHER): Payer: 59 | Admitting: Physician Assistant

## 2021-11-09 DIAGNOSIS — M7041 Prepatellar bursitis, right knee: Secondary | ICD-10-CM

## 2021-11-09 MED ORDER — PREDNISONE 50 MG PO TABS: 50.0000 mg | ORAL_TABLET | Freq: Every day | ORAL | 0 refills | Status: DC

## 2021-11-09 NOTE — Progress Notes (Signed)
? ?Office Visit Note ?  ?Patient: [name]           ?Date of Birth: May 01, 1966           ?MRN: [id number] ?Visit Date: 11/09/2021 ?             ?Requested by: Dorothyann Peng, NP ?[facility] ?[place],   [id number] ?PCP: Dorothyann Peng, NP ? ? ?Assessment & Plan: ?Visit Diagnoses:  ?1. Prepatellar bursitis, right knee   ? ? ?Plan:  ?Right knee was aspirated by Dr. Ninfa Linden, 20 cc of synovial yellow clear fluid was aspirated patient tolerated well.  We will place him on prednisone 50 mg 1 daily x5 days.  We will see him back in a week sooner if his condition becomes worse.  Questions were encouraged and answered by Dr. Ninfa Linden and myself. ? ?Follow-Up Instructions: Return in about 1 week (around 11/16/2021).  ? ?Orders:  ?Orders Placed This Encounter  ?Procedures  ? XR Knee 1-2 Views Right  ? ?Meds ordered this encounter  ?Medications  ? predniSONE (DELTASONE) 50 MG tablet  ?  Sig: Take 1 tablet (50 mg total) by mouth daily with breakfast.  ?  Dispense:  5 tablet  ?  Refill:  0  ? ? ? ? Procedures: ?No procedures performed ? ? ?Clinical Data: ?No additional findings. ? ? ?Subjective: ?Chief Complaint  ?Patient presents with  ? Right Knee - Pain  ? ? ?HPI ?Patient is a 56 year old male comes in today with right knee pain and swelling.  Is been ongoing for months.  He was seen at the orthopedic urgent care and was to follow-up with Dr. French Ana however his insurance changed and therefore he is following up with [place] today.  He has been on doxycycline and naproxen for a month.  He states he has had no fevers chills.  He states he has had no real change in the overall pain or the swelling of his knee.  He reports a month ago he was crawling on the concrete and developed swelling in the anterior aspect of the right knee.  He has a history of right knee arthroscopy 2008.  He denies any giving way locking catching or painful popping. ?Review of Systems ?See HPI otherwise negative ? ?Objective: ?Vital Signs:  There were no vitals taken for this visit. ? ?Physical Exam ?General well-developed well-nourished male in no acute distress ?Psych: Alert and oriented x3 ?Ortho Exam ?Bilateral knees good range of motion.  Slight patellofemoral crepitus both knees.  Tenderness lateral joint line at right knee only.  Tenderness over the right knee peripatellar region slight erythema.  Slight bogginess.  No effusion of the knee joint itself.  No instability with either knee with valgus varus stressing. ?Specialty Comments:  ?No specialty comments available. ? ?Imaging: ?XR Knee 1-2 Views Right ? ?Result Date: 11/09/2021 ?Right knee 2 views: Knee is well located.  Soft tissue swelling consistent with prepatellar bursitis.  Moderate narrowing medial joint line.  No acute fractures.  No bony abnormalities otherwise.  ? ? ?PMFS History: ?Patient Active Problem List  ? Diagnosis Date Noted  ? Depression 04/16/2018  ? 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 Elevated fasting blood sugar 10/06/2014  ? Reflux esophagitis 04/22/2012  ? HYPOGONADISM 09/19/2010  ? Hyperlipidemia 01/17/2010  ? ASTHMA 10/25/2008  ? ERECTILE DYSFUNCTION 10/08/2008  ? Essential hypertension 10/16/2007  ? Allergic rhinitis 05/13/2007  ? ?Past Medical History:  ?Diagnosis Date  ? ALLERGIC RHINITIS   ?  ANXIETY   ? ASTHMA   ? BACK PAIN   ? ERECTILE DYSFUNCTION   ? GERD   ? HYPERLIPIDEMIA   ? HYPERTENSION   ? LUMBAR STRAIN   ? NECK PAIN   ? PANCREATITIS, HX OF   ? PONV (postoperative nausea and vomiting)   ? nausea only   ? Seasonal allergies   ? SHOULDER PAIN, LEFT   ? Substance abuse ([place])   ? quit in 2007  ? TENDINITIS, PATELLAR   ?  ?Family History  ?Problem Relation Age of Onset  ? Lung disease Father   ? Melanoma Mother   ? Heart attack Brother   ? Colon polyps Neg Hx   ? Colon cancer Neg Hx   ? Stomach cancer Neg Hx   ? Pancreatic cancer Neg Hx   ? Esophageal cancer Neg Hx   ?  ?Past Surgical History:  ?Procedure Laterality Date  ? IMAGE GUIDED SINUS  SURGERY N/A 04/13/2015  ? Procedure: IMAGE GUIDED SINUS SURGERY;  Surgeon: Carloyn Manner, MD;  Location: [facility];  Service: ENT;  Laterality: N/A;  GAVE DISK TO CE CE  ? KNEE ARTHROSCOPY Right 2008  ? KNEE SURGERY Left   ? x2  ? MAXILLARY ANTROSTOMY Bilateral 04/13/2015  ? Procedure: MAXILLARY ANTROSTOMY;  Surgeon: Carloyn Manner, MD;  Location: [facility];  Service: ENT;  Laterality: Bilateral;  ? ?Social History  ? ?Occupational History  ? Not on file  ?Tobacco Use  ? Smoking status: Never  ? Smokeless tobacco: Current  ?  Types: Chew  ? Tobacco comments:  ?  form given 11/24/15  ?Vaping Use  ? Vaping Use: Never used  ?Substance and Sexual Activity  ? Alcohol use: No  ?  Alcohol/week: 0.0 standard drinks  ?  Comment: hx alcohol abuse  ? Drug use: No  ? Sexual activity: Not on file  ? ? ? ? ? ? ?

## 2021-11-16 ENCOUNTER — Ambulatory Visit (INDEPENDENT_AMBULATORY_CARE_PROVIDER_SITE_OTHER): Payer: 59 | Admitting: Physician Assistant

## 2021-11-16 ENCOUNTER — Encounter: Payer: Self-pay | Admitting: Physician Assistant

## 2021-11-16 ENCOUNTER — Other Ambulatory Visit: Payer: Self-pay

## 2021-11-16 VITALS — Ht 74.0 in | Wt 215.0 lb

## 2021-11-16 DIAGNOSIS — M7041 Prepatellar bursitis, right knee: Secondary | ICD-10-CM | POA: Diagnosis not present

## 2021-11-16 MED ORDER — DOXYCYCLINE HYCLATE 100 MG PO TABS
100.0000 mg | ORAL_TABLET | Freq: Two times a day (BID) | ORAL | 0 refills | Status: DC
Start: 2021-11-16 — End: 2021-11-24

## 2021-11-16 NOTE — Progress Notes (Signed)
? ?Office Visit Note ?  ?Patient: [name]           ?Date of Birth: 1966/06/27           ?MRN: [id number] ?Visit Date: 11/16/2021 ?             ?Requested by: Dorothyann Peng, NP ?[facility] ?[place],  [place] [id number] ?PCP: Dorothyann Peng, NP ? ? ?Assessment & Plan: ?Visit Diagnoses:  ?1. Prepatellar bursitis, right knee   ? ? ?Plan: Right knee is prepped with Betadine and then 9 cc of synovial fluid was obtained from the prepatellar aspiration.  Patient tolerated well.  Placed him back on doxycycline 100 mg twice daily.  We will see him back next week if he continues to have erythema and recurrent effusion recommend prepatellar irrigation debridement.  Questions were encouraged and answered. ? ?Follow-Up Instructions: Return in about 1 week (around 11/23/2021).  ? ?Orders:  ?No orders of the defined types were placed in this encounter. ? ?Meds ordered this encounter  ?Medications  ? doxycycline (VIBRA-TABS) 100 MG tablet  ?  Sig: Take 1 tablet (100 mg total) by mouth 2 (two) times daily for 14 days.  ?  Dispense:  28 tablet  ?  Refill:  0  ? ? ? ? Procedures: ?No procedures performed ? ? ?Clinical Data: ?No additional findings. ? ? ?Subjective: ?Chief Complaint  ?Patient presents with  ? Right Knee - Pain, Follow-up  ? ? ?HPI ?[name] returns today for follow-up of his right knee prepatellar bursitis.  He states that he can move the knee better.  However he has fluid on it.  He did finish the prednisone prescription and he had pretty good relief until 2 days after stopping the prednisone and redness and swelling returned.  He has been washing the knee with an antibacterial soap.  He is currently on no antibiotics.  No fevers chills.  No new injury. ? ?Review of Systems ?Please see HPI ? ?Objective: ?Vital Signs: Ht '6\' 2"'$  (1.88 m)   Wt 215 lb (97.5 kg)   BMI 27.60 kg/m?  ? ?Physical Exam ?General well-developed well-nourished male in no acute distress. ?Ortho Exam ?Right knee good range of motion.   No drainage.  Erythema over the prepatellar region with positive effusion. ?Specialty Comments:  ?No specialty comments available. ? ?Imaging: ?No results found. ? ? ?PMFS History: ?Patient Active Problem List  ? Diagnosis Date Noted  ? Depression 04/16/2018  ? 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 Elevated fasting blood sugar 10/06/2014  ? Reflux esophagitis 04/22/2012  ? HYPOGONADISM 09/19/2010  ? Hyperlipidemia 01/17/2010  ? ASTHMA 10/25/2008  ? ERECTILE DYSFUNCTION 10/08/2008  ? Essential hypertension 10/16/2007  ? Allergic rhinitis 05/13/2007  ? ?Past Medical History:  ?Diagnosis Date  ? ALLERGIC RHINITIS   ? ANXIETY   ? ASTHMA   ? BACK PAIN   ? ERECTILE DYSFUNCTION   ? GERD   ? HYPERLIPIDEMIA   ? HYPERTENSION   ? LUMBAR STRAIN   ? NECK PAIN   ? PANCREATITIS, HX OF   ? PONV (postoperative nausea and vomiting)   ? nausea only   ? Seasonal allergies   ? SHOULDER PAIN, LEFT   ? Substance abuse ([place])   ? quit in 2007  ? TENDINITIS, PATELLAR   ?  ?Family History  ?Problem Relation Age of Onset  ? Lung disease Father   ? Melanoma Mother   ? Heart attack Brother   ?  Colon polyps Neg Hx   ? Colon cancer Neg Hx   ? Stomach cancer Neg Hx   ? Pancreatic cancer Neg Hx   ? Esophageal cancer Neg Hx   ?  ?Past Surgical History:  ?Procedure Laterality Date  ? IMAGE GUIDED SINUS SURGERY N/A 04/13/2015  ? Procedure: IMAGE GUIDED SINUS SURGERY;  Surgeon: Carloyn Manner, MD;  Location: [facility];  Service: ENT;  Laterality: N/A;  GAVE DISK TO CE CE  ? KNEE ARTHROSCOPY Right 2008  ? KNEE SURGERY Left   ? x2  ? MAXILLARY ANTROSTOMY Bilateral 04/13/2015  ? Procedure: MAXILLARY ANTROSTOMY;  Surgeon: Carloyn Manner, MD;  Location: [facility];  Service: ENT;  Laterality: Bilateral;  ? ?Social History  ? ?Occupational History  ? Not on file  ?Tobacco Use  ? Smoking status: Never  ? Smokeless tobacco: Current  ?  Types: Chew  ? Tobacco comments:  ?  form given 11/24/15  ?Vaping Use  ? Vaping Use: Never  used  ?Substance and Sexual Activity  ? Alcohol use: No  ?  Alcohol/week: 0.0 standard drinks  ?  Comment: hx alcohol abuse  ? Drug use: No  ? Sexual activity: Not on file  ? ? ? ? ? ? ?

## 2021-11-17 ENCOUNTER — Telehealth: Payer: Self-pay | Admitting: Physician Assistant

## 2021-11-17 NOTE — Telephone Encounter (Signed)
Pt called and would like to talk to someone about the swelling in her knee.  ?

## 2021-11-17 NOTE — Telephone Encounter (Signed)
Patient states he started to swell more again  ?I told to him to rest, elevate, ice, keep up with the antibiotics, but if it got much worse to call and we will work him in Monday instead of waiting until Wednesday  ?

## 2021-11-22 ENCOUNTER — Encounter: Payer: Self-pay | Admitting: Physician Assistant

## 2021-11-22 ENCOUNTER — Ambulatory Visit (INDEPENDENT_AMBULATORY_CARE_PROVIDER_SITE_OTHER): Payer: 59 | Admitting: Physician Assistant

## 2021-11-22 DIAGNOSIS — M7041 Prepatellar bursitis, right knee: Secondary | ICD-10-CM

## 2021-11-22 NOTE — Progress Notes (Signed)
HPI: Mr. Newstrom returns today for follow-up of his right knee prepatellar bursitis.  He continues to have swelling and pain in the knee.  States the swelling returns today after last aspiration.  He has been on doxycycline 100 mg twice daily.  He has had no fevers or chills.  Again he is had 3 aspirations he is been on antibiotics for some time now. ? ?Review of systems see HPI otherwise negative ? ?Physical exam: Right knee slight erythema over the prepatellar bursa region.  Fluctuance consistent with rate current effusion.  Calf supple nontender.  Good range of motion of the knee otherwise. ? ?Impression: Right knee prepatellar bursitis ? ?Plan: Due to the recurrence and the chronic nature of his prepatellar bursitis despite conservative measures recommend right knee bursectomy.  Dr. Marlou Sa examined the patient today discussed with him surgical bursectomy.  Patient would like to proceed with right knee bursectomy near future.  He understands postoperatively he will be in the knee immobilizer need to keep the knee straight for at least 1 week.  He also understands that he will not be able to place pressure directly on the knee or climb ladders for at least 2 months.  Questions encouraged and answered at length.  He will follow-up with Dr. Marlou Sa postop. ?

## 2021-11-23 ENCOUNTER — Encounter (HOSPITAL_COMMUNITY): Payer: Self-pay | Admitting: Orthopedic Surgery

## 2021-11-23 ENCOUNTER — Other Ambulatory Visit: Payer: Self-pay

## 2021-11-23 NOTE — Progress Notes (Signed)
PCP - Dorothyann Peng, NP ?Cardiologist - denies ?EKG - DOS ?Chest x-ray -  ?ECHO -  ?Cardiac Cath -  ?CPAP -  ? ?ERAS Protcol - LIGHT breakfast by 0700, clears until 1345 ?COVID TEST- n/a (pt states he was told going home that day)  ? ?Anesthesia review: n/a ? ? ? ?Per Dr. Marcie Bal, LIGHT breakfast permitted until 1610. (Light breakfast = toast/cereal) ?------------- ? ?SDW INSTRUCTIONS: ? ?Your procedure is scheduled on Friday 3/17. Please report to [facility] Main Entrance "A" at 2:15 P.M., and check in at the Admitting office. Call this number if you have problems the morning of surgery: [phone] ? ? ?Remember: Do not eat after 7AM before your surgery - ok to have LIGHT breakfast  ? ?You may drink clear liquids until 1: 45PM  the day of your surgery.   ?Clear liquids allowed are: Water, Non-Citrus Juices (without pulp), Carbonated Beverages, Clear Tea, Black Coffee Only, and Gatorade (do NOT add anything to drink)  ?  ?Medications to take morning of surgery with a sip of water include: ?buPROPion ER [facility] SR) ?doxycycline (VIBRA-TABS)  ?levothyroxine (SYNTHROID) ?omeprazole (PRILOSEC) ? ?Eye drops if needed ?fluticasone (FLONASE) if needed ?traMADol [facility]) if needed ? ? ?As of today, STOP taking any meloxicam (MOBIC), Aspirin (unless otherwise instructed by your surgeon), Aleve, Naproxen, Ibuprofen, Motrin, Advil, Goody's, BC's, all herbal medications, fish oil, and all vitamins. ? ?  ?The Morning of Surgery ?Do not wear jewelry ?Do not wear lotions, powders, colognes, or deodorant ?Do not bring valuables to the hospital. ?[facility] is not responsible for any belongings or valuables. ? ?If you are a smoker, DO NOT Smoke 24 hours prior to surgery ? ?If you wear a CPAP at night please bring your mask the morning of surgery  ? ?Remember that you must have someone to transport you home after your surgery, and remain with you for 24 hours if you are discharged the same day. ? ?Please bring cases for  contacts, glasses, hearing aids, dentures or bridgework because it cannot be worn into surgery.  ? ?Patients discharged the day of surgery will not be allowed to drive home.  ? ?Please shower the NIGHT BEFORE/MORNING OF SURGERY (use antibacterial soap like DIAL soap if possible). Wear comfortable clothes the morning of surgery. Oral Hygiene is also important to reduce your risk of infection.  Remember - BRUSH YOUR TEETH THE MORNING OF SURGERY WITH YOUR REGULAR TOOTHPASTE ? ?Patient denies shortness of breath, fever, cough and chest pain.  ? ? ?   ? ?

## 2021-11-24 ENCOUNTER — Encounter (HOSPITAL_COMMUNITY): Payer: Self-pay | Admitting: Orthopedic Surgery

## 2021-11-24 ENCOUNTER — Encounter (HOSPITAL_COMMUNITY)
Admission: RE | Disposition: A | Discharge status: Home / Self Care | Payer: Self-pay | Source: Home / Self Care | Attending: Orthopedic Surgery

## 2021-11-24 ENCOUNTER — Ambulatory Visit (HOSPITAL_COMMUNITY): Payer: 59 | Admitting: Anesthesiology

## 2021-11-24 ENCOUNTER — Other Ambulatory Visit: Payer: Self-pay

## 2021-11-24 ENCOUNTER — Ambulatory Visit (HOSPITAL_COMMUNITY)
Admission: RE | Admit: 2021-11-24 | Discharge: 2021-11-24 | Disposition: A | Discharge status: Home / Self Care | Payer: 59 | Attending: Orthopedic Surgery | Admitting: Orthopedic Surgery

## 2021-11-24 ENCOUNTER — Ambulatory Visit (HOSPITAL_BASED_OUTPATIENT_CLINIC_OR_DEPARTMENT_OTHER): Payer: 59 | Admitting: Anesthesiology

## 2021-11-24 DIAGNOSIS — F418 Other specified anxiety disorders: Secondary | ICD-10-CM | POA: Diagnosis not present

## 2021-11-24 DIAGNOSIS — I10 Essential (primary) hypertension: Secondary | ICD-10-CM | POA: Insufficient documentation

## 2021-11-24 DIAGNOSIS — E039 Hypothyroidism, unspecified: Secondary | ICD-10-CM | POA: Insufficient documentation

## 2021-11-24 DIAGNOSIS — M7041 Prepatellar bursitis, right knee: Secondary | ICD-10-CM

## 2021-11-24 DIAGNOSIS — K219 Gastro-esophageal reflux disease without esophagitis: Secondary | ICD-10-CM | POA: Insufficient documentation

## 2021-11-24 DIAGNOSIS — M25561 Pain in right knee: Secondary | ICD-10-CM | POA: Diagnosis present

## 2021-11-24 HISTORY — PX: KNEE BURSECTOMY: SHX5882

## 2021-11-24 LAB — BASIC METABOLIC PANEL
Anion gap: 11 (ref 5–15)
BUN: 16 mg/dL (ref 6–20)
CO2: 23 mmol/L (ref 22–32)
Calcium: 9.1 mg/dL (ref 8.9–10.3)
Chloride: 104 mmol/L (ref 98–111)
Creatinine, Ser: 0.85 mg/dL (ref 0.61–1.24)
GFR, Estimated: >60 mL/min (ref >60)
Glucose, Bld: 106 mg/dL — ABNORMAL HIGH (ref 70–99)
Potassium: 4 mmol/L (ref 3.5–5.1)
Sodium: 138 mmol/L (ref 135–145)

## 2021-11-24 LAB — CBC
HCT: 46.7 % (ref 39.0–52.0)
Hemoglobin: 15.7 g/dL (ref 13.0–17.0)
MCH: 29.1 pg (ref 26.0–34.0)
MCHC: 33.6 g/dL (ref 30.0–36.0)
MCV: 86.5 fL (ref 80.0–100.0)
Platelets: 197 K/uL (ref 150–400)
RBC: 5.4 MIL/uL (ref 4.22–5.81)
RDW: 11.9 % (ref 11.5–15.5)
WBC: 5.9 K/uL (ref 4.0–10.5)
nRBC: 0 % (ref 0.0–0.2)

## 2021-11-24 LAB — SURGICAL PCR SCREEN
MRSA, PCR: NEGATIVE
Staphylococcus aureus: NEGATIVE

## 2021-11-24 SURGERY — BURSECTOMY, KNEE
Anesthesia: General | Site: Knee | Laterality: Right

## 2021-11-24 MED ORDER — LIDOCAINE 2% (20 MG/ML) 5 ML SYRINGE
INTRAMUSCULAR | Status: DC | PRN
  Administered 2021-11-24: 60 mg via INTRAVENOUS

## 2021-11-24 MED ORDER — VANCOMYCIN HCL 1000 MG IV SOLR
INTRAVENOUS | Status: DC | PRN
  Administered 2021-11-24: 1000 mg

## 2021-11-24 MED ORDER — TRANEXAMIC ACID-NACL 1000-0.7 MG/100ML-% IV SOLN
INTRAVENOUS | Status: AC
  Filled 2021-11-24: qty 100

## 2021-11-24 MED ORDER — FENTANYL CITRATE (PF) 250 MCG/5ML IJ SOLN
INTRAMUSCULAR | Status: AC
  Filled 2021-11-24: qty 5

## 2021-11-24 MED ORDER — FENTANYL CITRATE (PF) 250 MCG/5ML IJ SOLN
INTRAMUSCULAR | Status: DC | PRN
  Administered 2021-11-24: 100 ug via INTRAVENOUS
  Administered 2021-11-24 (×3): 50 ug via INTRAVENOUS

## 2021-11-24 MED ORDER — BUPIVACAINE-EPINEPHRINE (PF) 0.25% -1:200000 IJ SOLN
INTRAMUSCULAR | Status: AC
  Filled 2021-11-24: qty 30

## 2021-11-24 MED ORDER — ORAL CARE MOUTH RINSE: 15.0000 mL | Freq: Once | OROMUCOSAL | Status: AC

## 2021-11-24 MED ORDER — POVIDONE-IODINE 10 % EX SWAB
2.0000 "application " | Freq: Once | CUTANEOUS | Status: AC
  Administered 2021-11-24: 2 via TOPICAL

## 2021-11-24 MED ORDER — 0.9 % SODIUM CHLORIDE (POUR BTL) OPTIME
TOPICAL | Status: DC | PRN
  Administered 2021-11-24 (×4): 1000 mL

## 2021-11-24 MED ORDER — ONDANSETRON HCL 4 MG/2ML IJ SOLN
INTRAMUSCULAR | Status: DC | PRN
  Administered 2021-11-24: 4 mg via INTRAVENOUS

## 2021-11-24 MED ORDER — BUPIVACAINE-EPINEPHRINE 0.5% -1:200000 IJ SOLN
INTRAMUSCULAR | Status: AC
  Filled 2021-11-24: qty 1

## 2021-11-24 MED ORDER — BUPIVACAINE HCL (PF) 0.5 % IJ SOLN
INTRAMUSCULAR | Status: AC
  Filled 2021-11-24: qty 30

## 2021-11-24 MED ORDER — DEXAMETHASONE SODIUM PHOSPHATE 10 MG/ML IJ SOLN
INTRAMUSCULAR | Status: DC | PRN
  Administered 2021-11-24: 10 mg via INTRAVENOUS

## 2021-11-24 MED ORDER — CEFAZOLIN SODIUM-DEXTROSE 2-3 GM-%(50ML) IV SOLR
INTRAVENOUS | Status: DC | PRN
  Administered 2021-11-24: 2 g via INTRAVENOUS

## 2021-11-24 MED ORDER — GABAPENTIN 300 MG PO CAPS: 300.0000 mg | ORAL_CAPSULE | Freq: Three times a day (TID) | ORAL | 0 refills | Status: DC

## 2021-11-24 MED ORDER — DOXYCYCLINE HYCLATE 100 MG PO TABS: 100.0000 mg | ORAL_TABLET | Freq: Two times a day (BID) | ORAL | 0 refills | Status: AC

## 2021-11-24 MED ORDER — ASPIRIN 81 MG PO CHEW: 81.0000 mg | CHEWABLE_TABLET | Freq: Every day | ORAL | 0 refills | Status: AC

## 2021-11-24 MED ORDER — MORPHINE SULFATE 4 MG/ML IJ SOLN
INTRAMUSCULAR | Status: DC | PRN
  Administered 2021-11-24: 38 mL via INTRAMUSCULAR

## 2021-11-24 MED ORDER — OXYCODONE HCL 5 MG/5ML PO SOLN: 5.0000 mg | Freq: Once | ORAL | Status: AC | PRN

## 2021-11-24 MED ORDER — ACETAMINOPHEN 500 MG PO TABS
ORAL_TABLET | ORAL | Status: AC
  Filled 2021-11-24: qty 2

## 2021-11-24 MED ORDER — SCOPOLAMINE 1 MG/3DAYS TD PT72
MEDICATED_PATCH | TRANSDERMAL | Status: AC
  Filled 2021-11-24: qty 1

## 2021-11-24 MED ORDER — ONDANSETRON HCL 4 MG/2ML IJ SOLN: 4.0000 mg | Freq: Four times a day (QID) | INTRAMUSCULAR | Status: DC | PRN

## 2021-11-24 MED ORDER — CHLORHEXIDINE GLUCONATE 4 % EX LIQD
60.0000 mL | Freq: Once | CUTANEOUS | Status: AC
  Administered 2021-11-24: 4 via TOPICAL

## 2021-11-24 MED ORDER — FENTANYL CITRATE (PF) 100 MCG/2ML IJ SOLN
INTRAMUSCULAR | Status: AC
  Filled 2021-11-24: qty 2

## 2021-11-24 MED ORDER — ACETAMINOPHEN 500 MG PO TABS
1000.0000 mg | ORAL_TABLET | Freq: Once | ORAL | Status: AC
  Administered 2021-11-24: 1000 mg via ORAL

## 2021-11-24 MED ORDER — LACTATED RINGERS IV SOLN: INTRAVENOUS | Status: DC

## 2021-11-24 MED ORDER — MIDAZOLAM HCL 2 MG/2ML IJ SOLN
INTRAMUSCULAR | Status: DC | PRN
  Administered 2021-11-24: 2 mg via INTRAVENOUS

## 2021-11-24 MED ORDER — CLONIDINE HCL (ANALGESIA) 100 MCG/ML EP SOLN
EPIDURAL | Status: AC
  Filled 2021-11-24: qty 10

## 2021-11-24 MED ORDER — PROPOFOL 10 MG/ML IV BOLUS
INTRAVENOUS | Status: DC | PRN
  Administered 2021-11-24: 200 mg via INTRAVENOUS

## 2021-11-24 MED ORDER — FENTANYL CITRATE (PF) 100 MCG/2ML IJ SOLN
25.0000 ug | INTRAMUSCULAR | Status: DC | PRN
  Administered 2021-11-24 (×2): 50 ug via INTRAVENOUS

## 2021-11-24 MED ORDER — PROPOFOL 500 MG/50ML IV EMUL
INTRAVENOUS | Status: DC | PRN
  Administered 2021-11-24: 100 ug/kg/min via INTRAVENOUS

## 2021-11-24 MED ORDER — TRANEXAMIC ACID-NACL 1000-0.7 MG/100ML-% IV SOLN
1000.0000 mg | INTRAVENOUS | Status: AC
  Administered 2021-11-24: 1000 mg via INTRAVENOUS

## 2021-11-24 MED ORDER — OXYCODONE HCL 5 MG PO TABS
5.0000 mg | ORAL_TABLET | Freq: Once | ORAL | Status: AC | PRN
  Administered 2021-11-24: 5 mg via ORAL

## 2021-11-24 MED ORDER — MORPHINE SULFATE (PF) 4 MG/ML IV SOLN
INTRAVENOUS | Status: AC
  Filled 2021-11-24: qty 2

## 2021-11-24 MED ORDER — CHLORHEXIDINE GLUCONATE 0.12 % MT SOLN
15.0000 mL | Freq: Once | OROMUCOSAL | Status: AC
  Administered 2021-11-24: 15 mL via OROMUCOSAL
  Filled 2021-11-24: qty 15

## 2021-11-24 MED ORDER — BUPIVACAINE HCL 0.5 % IJ SOLN
INTRAMUSCULAR | Status: AC
  Filled 2021-11-24: qty 1

## 2021-11-24 MED ORDER — PROPOFOL 10 MG/ML IV BOLUS
INTRAVENOUS | Status: AC
  Filled 2021-11-24: qty 20

## 2021-11-24 MED ORDER — BUPIVACAINE HCL (PF) 0.25 % IJ SOLN
INTRAMUSCULAR | Status: AC
  Filled 2021-11-24: qty 30

## 2021-11-24 MED ORDER — OXYCODONE HCL 5 MG PO TABS
ORAL_TABLET | ORAL | Status: AC
  Filled 2021-11-24: qty 1

## 2021-11-24 MED ORDER — OXYCODONE-ACETAMINOPHEN 5-325 MG PO TABS: 1.0000 | ORAL_TABLET | ORAL | 0 refills | Status: DC | PRN

## 2021-11-24 MED ORDER — SCOPOLAMINE 1 MG/3DAYS TD PT72
1.0000 | MEDICATED_PATCH | Freq: Once | TRANSDERMAL | Status: DC
  Administered 2021-11-24: 1.5 mg via TRANSDERMAL

## 2021-11-24 SURGICAL SUPPLY — 64 items
BAG COUNTER SPONGE SURGICOUNT (BAG) ×2 IMPLANT
BANDAGE ESMARK 6X9 LF (GAUZE/BANDAGES/DRESSINGS) ×1 IMPLANT
BNDG COHESIVE 6X5 TAN STRL LF (GAUZE/BANDAGES/DRESSINGS) ×2 IMPLANT
BNDG ELASTIC 6X10 VLCR STRL LF (GAUZE/BANDAGES/DRESSINGS) ×4 IMPLANT
BNDG ESMARK 6X9 LF (GAUZE/BANDAGES/DRESSINGS) ×2
CNTNR URN SCR LID CUP LEK RST (MISCELLANEOUS) IMPLANT
CONT SPEC 4OZ STRL OR WHT (MISCELLANEOUS) ×2
COVER SURGICAL LIGHT HANDLE (MISCELLANEOUS) ×2 IMPLANT
CUFF TOURN SGL QUICK 34 (TOURNIQUET CUFF)
CUFF TOURN SGL QUICK 42 (TOURNIQUET CUFF) IMPLANT
CUFF TRNQT CYL 34X4.125X (TOURNIQUET CUFF) IMPLANT
DRAPE INCISE IOBAN 66X45 STRL (DRAPES) ×1 IMPLANT
DRAPE ORTHO SPLIT 77X108 STRL (DRAPES) ×4
DRAPE SURG ORHT 6 SPLT 77X108 (DRAPES) ×3 IMPLANT
DRAPE U-SHAPE 47X51 STRL (DRAPES) ×2 IMPLANT
DRSG AQUACEL AG ADV 3.5X10 (GAUZE/BANDAGES/DRESSINGS) ×1 IMPLANT
DRSG PAD ABDOMINAL 8X10 ST (GAUZE/BANDAGES/DRESSINGS) ×4 IMPLANT
DURAPREP 26ML APPLICATOR (WOUND CARE) ×3 IMPLANT
ELECT REM PT RETURN 9FT ADLT (ELECTROSURGICAL) ×2
ELECTRODE REM PT RTRN 9FT ADLT (ELECTROSURGICAL) ×1 IMPLANT
GAUZE SPONGE 4X4 12PLY STRL (GAUZE/BANDAGES/DRESSINGS) ×2 IMPLANT
GAUZE XEROFORM 5X9 LF (GAUZE/BANDAGES/DRESSINGS) ×2 IMPLANT
GLOVE SRG 8 PF TXTR STRL LF DI (GLOVE) ×1 IMPLANT
GLOVE SURG LTX SZ8 (GLOVE) ×2 IMPLANT
GLOVE SURG UNDER POLY LF SZ8 (GLOVE) ×2
GOWN STRL REUS W/ TWL LRG LVL3 (GOWN DISPOSABLE) ×3 IMPLANT
GOWN STRL REUS W/TWL LRG LVL3 (GOWN DISPOSABLE) ×6
HANDPIECE INTERPULSE COAX TIP (DISPOSABLE) ×2
IMMOBILIZER KNEE 22 UNIV (SOFTGOODS) ×1 IMPLANT
IMMOBILIZER KNEE 24 THIGH 36 (MISCELLANEOUS) IMPLANT
IMMOBILIZER KNEE 24 UNIV (MISCELLANEOUS) ×2
IV NS IRRIG 3000ML ARTHROMATIC (IV SOLUTION) ×2 IMPLANT
KIT BASIN OR (CUSTOM PROCEDURE TRAY) ×2 IMPLANT
KIT TURNOVER KIT B (KITS) ×2 IMPLANT
NDL 18GX1X1/2 (RX/OR ONLY) (NEEDLE) IMPLANT
NDL SPNL 18GX3.5 QUINCKE PK (NEEDLE) IMPLANT
NEEDLE 18GX1X1/2 (RX/OR ONLY) (NEEDLE) ×2 IMPLANT
NEEDLE SPNL 18GX3.5 QUINCKE PK (NEEDLE) IMPLANT
NS IRRIG 1000ML POUR BTL (IV SOLUTION) ×5 IMPLANT
PACK TOTAL JOINT (CUSTOM PROCEDURE TRAY) ×2 IMPLANT
PAD ARMBOARD 7.5X6 YLW CONV (MISCELLANEOUS) ×3 IMPLANT
PAD CAST 4YDX4 CTTN HI CHSV (CAST SUPPLIES) ×1 IMPLANT
PADDING CAST COTTON 4X4 STRL (CAST SUPPLIES) ×2
PADDING CAST COTTON 6X4 STRL (CAST SUPPLIES) ×6 IMPLANT
SET HNDPC FAN SPRY TIP SCT (DISPOSABLE) ×1 IMPLANT
SPONGE T-LAP 18X18 ~~LOC~~+RFID (SPONGE) ×1 IMPLANT
STAPLER VISISTAT 35W (STAPLE) ×2 IMPLANT
SUCTION FRAZIER HANDLE 10FR (MISCELLANEOUS) ×2
SUCTION TUBE FRAZIER 10FR DISP (MISCELLANEOUS) ×1 IMPLANT
SUT ETHILON 3 0 PS 1 (SUTURE) ×8 IMPLANT
SUT VIC AB 0 CT1 27 (SUTURE) ×6
SUT VIC AB 0 CT1 27XBRD ANBCTR (SUTURE) ×3 IMPLANT
SUT VIC AB 2-0 CT1 27 (SUTURE) ×2
SUT VIC AB 2-0 CT1 TAPERPNT 27 (SUTURE) ×2 IMPLANT
SUT VIC AB 3-0 SH 27 (SUTURE) ×10
SUT VIC AB 3-0 SH 27X BRD (SUTURE) IMPLANT
SWAB COLLECTION DEVICE MRSA (MISCELLANEOUS) IMPLANT
SWAB CULTURE ESWAB REG 1ML (MISCELLANEOUS) ×2 IMPLANT
SYR 30ML SLIP (SYRINGE) IMPLANT
SYR TB 1ML LUER SLIP (SYRINGE) IMPLANT
TOWEL GREEN STERILE (TOWEL DISPOSABLE) ×8 IMPLANT
TOWEL GREEN STERILE FF (TOWEL DISPOSABLE) ×2 IMPLANT
TRAY FOLEY MTR SLVR 16FR STAT (SET/KITS/TRAYS/PACK) ×2 IMPLANT
WATER STERILE IRR 1000ML POUR (IV SOLUTION) ×6 IMPLANT

## 2021-11-24 NOTE — Transfer of Care (Signed)
Immediate Anesthesia Transfer of Care Note ? ?Patient: [name] [facility] ? ?Procedure(s) Performed: right knee bursectomy (Right: Knee) ? ?Patient Location: PACU ? ?Anesthesia Type:General ? ?Level of Consciousness: awake ? ?Airway & Oxygen Therapy: Patient Spontanous Breathing ? ?Post-op Assessment: Report given to RN and Post -op Vital signs reviewed and stable ? ?Post vital signs: Reviewed and stable ? ?Last Vitals:  ?Vitals Value Taken Time  ?BP 122/81 11/24/21 2050  ?Temp 36.2 ?C 11/24/21 2045  ?Pulse 69 11/24/21 2057  ?Resp 14 11/24/21 2057  ?SpO2 97 % 11/24/21 2057  ?Vitals shown include unvalidated device data. ? ?Last Pain:  ?Vitals:  ? 11/24/21 2045  ?TempSrc:   ?PainSc: 3   ?   ? ?Patients Stated Pain Goal: 3 (11/24/21 2045) ? ?Complications: No notable events documented. ?

## 2021-11-24 NOTE — H&P (Signed)
[name] is an 56 y.o. male.   ?Chief Complaint: Right knee pain ?HPI: Plan is a 56 year old patient is very active.  Has a several month history of right knee pain with recurrent prepatellar bursa effusions with aspiration and placement on antibiotics several times.  He reports continued recurrence of pain and swelling in that prepatellar bursa region.  He is unable to work which is a very physical job.  No personal or family history of DVT or pulmonary embolism. ? ?Past Medical History:  ?Diagnosis Date  ? ALLERGIC RHINITIS   ? ANXIETY   ? ASTHMA   ? BACK PAIN   ? ERECTILE DYSFUNCTION   ? GERD   ? HYPERLIPIDEMIA   ? HYPERTENSION   ? LUMBAR STRAIN   ? NECK PAIN   ? PANCREATITIS, HX OF   ? PONV (postoperative nausea and vomiting)   ? nausea only   ? Seasonal allergies   ? SHOULDER PAIN, LEFT   ? Substance abuse ([place])   ? quit in 2007  ? TENDINITIS, PATELLAR   ? ? ?Past Surgical History:  ?Procedure Laterality Date  ? IMAGE GUIDED SINUS SURGERY N/A 04/13/2015  ? Procedure: IMAGE GUIDED SINUS SURGERY;  Surgeon: Carloyn Manner, MD;  Location: [place];  Service: ENT;  Laterality: N/A;  GAVE DISK TO CE CE  ? KNEE ARTHROSCOPY Right 2008  ? KNEE SURGERY Left   ? x2  ? MAXILLARY ANTROSTOMY Bilateral 04/13/2015  ? Procedure: MAXILLARY ANTROSTOMY;  Surgeon: Carloyn Manner, MD;  Location: [place];  Service: ENT;  Laterality: Bilateral;  ? ? ?Family History  ?Problem Relation Age of Onset  ? Lung disease Father   ? Melanoma Mother   ? Heart attack Brother   ? Colon polyps Neg Hx   ? Colon cancer Neg Hx   ? Stomach cancer Neg Hx   ? Pancreatic cancer Neg Hx   ? Esophageal cancer Neg Hx   ? ?Social History:  reports that he has never smoked. His smokeless tobacco use includes chew. He reports that he does not drink alcohol and does not use drugs. ? ?Allergies:  ?Allergies  ?Allergen Reactions  ? Aspirin Nausea And Vomiting  ? ? ?Medications Prior to Admission  ?Medication Sig Dispense Refill  ?  Artificial Tear Solution (SOOTHE XP) SOLN Place 1 drop into both eyes daily.    ? buPROPion ER (WELLBUTRIN SR) 100 MG 12 hr tablet TAKE TWO TABLETS BY MOUTH EVERY MORNING 180 tablet 1  ? cyclobenzaprine (FLEXERIL) 10 MG tablet Take 1 tablet (10 mg total) by mouth at bedtime. 90 tablet 0  ? doxycycline (VIBRA-TABS) 100 MG tablet Take 1 tablet (100 mg total) by mouth 2 (two) times daily for 14 days. 28 tablet 0  ? fluticasone (FLONASE) 50 MCG/ACT nasal spray SPRAY 1 SPRAY INTO EACH NOSTRIL EVERY DAY 48 g 1  ? guaiFENesin (MUCINEX) 600 MG 12 hr tablet Take 600 mg by mouth 2 (two) times daily as needed (congestion).    ? levothyroxine (SYNTHROID) 75 MCG tablet Take 1 tablet (75 mcg total) by mouth daily before breakfast. 90 tablet 3  ? losartan (COZAAR) 100 MG tablet TAKE ONE TABLET BY MOUTH DAILY 90 tablet 3  ? lovastatin (MEVACOR) 40 MG tablet Take 1 tablet (40 mg total) by mouth at bedtime. 90 tablet 3  ? meloxicam (MOBIC) 15 MG tablet TAKE ONE TABLET BY MOUTH DAILY AS NEEDED FOR PAIN 90 tablet 1  ? Multiple Vitamin (MULTIVITAMIN WITH MINERALS) TABS  tablet Take 1 tablet by mouth daily.    ? omeprazole (PRILOSEC) 20 MG capsule TAKE ONE CAPSULE BY MOUTH DAILY 90 capsule 3  ? sildenafil (VIAGRA) 100 MG tablet TAKE ONE TABLET BY MOUTH DAILY AS NEEDED FOR ERECTILE DYSFUNCTION 15 tablet 3  ? traMADol (ULTRAM) 50 MG tablet TAKE ONE TABLET BY MOUTH THREE TIMES A DAY AS NEEDED 90 tablet 2  ? hydrocortisone (ANUSOL-HC) 2.5 % rectal cream Place 1 application rectally 2 (two) times daily. (Patient not taking: Reported on 11/23/2021) 30 g 3  ? predniSONE (DELTASONE) 50 MG tablet Take 1 tablet (50 mg total) by mouth daily with breakfast. (Patient not taking: Reported on 11/23/2021) 5 tablet 0  ? ? ?Results for orders placed or performed during the hospital encounter of 11/24/21 (from the past 48 hour(s))  ?CBC per protocol     Status: None  ? Collection Time: 11/24/21  2:26 PM  ?Result Value Ref Range  ? WBC 5.9 4.0 - 10.5 K/uL  ?  RBC 5.40 4.22 - 5.81 MIL/uL  ? Hemoglobin 15.7 13.0 - 17.0 g/dL  ? HCT 46.7 39.0 - 52.0 %  ? MCV 86.5 80.0 - 100.0 fL  ? MCH 29.1 26.0 - 34.0 pg  ? MCHC 33.6 30.0 - 36.0 g/dL  ? RDW 11.9 11.5 - 15.5 %  ? Platelets 197 150 - 400 K/uL  ? nRBC 0.0 0.0 - 0.2 %  ?  Comment: Performed at [facility], [place] [address], [place], [place] [address]  ? ?No results found. ? ?Review of Systems  ?Musculoskeletal:  Positive for arthralgias.  ?All other systems reviewed and are negative. ? ?Blood pressure (!) 155/91, pulse 80, temperature 98.4 ?F (36.9 ?C), temperature source Oral, resp. rate 18, height '6\' 2"'$  (1.88 m), weight 97.5 kg, SpO2 99 %. ?Physical Exam ?Vitals reviewed.  ?HENT:  ?   Head: Normocephalic.  ?   Nose: Nose normal.  ?   Mouth/Throat:  ?   Mouth: Mucous membranes are moist.  ?Eyes:  ?   Pupils: Pupils are equal, round, and reactive to light.  ?Cardiovascular:  ?   Rate and Rhythm: Normal rate.  ?   Pulses: Normal pulses.  ?Pulmonary:  ?   Effort: Pulmonary effort is normal.  ?Abdominal:  ?   General: Abdomen is flat.  ?Musculoskeletal:  ?   Cervical back: Normal range of motion.  ?Skin: ?   General: Skin is warm.  ?   Capillary Refill: Capillary refill takes less than 2 seconds.  ?Neurological:  ?   General: No focal deficit present.  ?   Mental Status: He is alert.  ?Psychiatric:     ?   Mood and Affect: Mood normal.  ?Examination of the right knee demonstrates warm right knee with no effusion but prepatellar bursal fluid collection is present with size of that prepatellar bursa about the size of half an avocado.  No proximal lymphadenopathy.  No skin breakdown or laceration around the knee itself.  Collateral and cruciate ligaments are stable ? ?Assessment/Plan ?Impression is refractory prepatellar bursitis.  Plan is prepatellar bursectomy with sending the fluid for culture and analysis.  This may or may not be infected it is difficult to tell based on the amount of antibiotics the patient has been on  since this started about 4 weeks ago.  Anticipate home on oral antibiotics pending final culture results.  Weightbearing as tolerated in knee immobilizer until return visit.  All questions answered. ? ?Landry Dyke  Marlou Sa, MD ?11/24/2021, 3:22 PM ? ? ? ?

## 2021-11-24 NOTE — Anesthesia Procedure Notes (Signed)
Procedure Name: LMA Insertion ?Date/Time: 11/24/2021 7:35 PM ?Performed by: Clovis Cao, CRNA ?Pre-anesthesia Checklist: Patient identified, Emergency Drugs available, Suction available and Patient being monitored ?Patient Re-evaluated:Patient Re-evaluated prior to induction ?Oxygen Delivery Method: Circle system utilized ?Preoxygenation: Pre-oxygenation with 100% oxygen ?Induction Type: IV induction ?Ventilation: Mask ventilation without difficulty ?LMA: LMA inserted ?LMA Size: 4.0 ?Number of attempts: 1 ?Placement Confirmation: positive ETCO2 and breath sounds checked- equal and bilateral ?Tube secured with: Tape ?Dental Injury: Teeth and Oropharynx as per pre-operative assessment  ? ? ? ? ?

## 2021-11-24 NOTE — Op Note (Signed)
NAME: [name], AFONSO ?MEDICAL RECORD NO: [id number] ?ACCOUNT NO: [id number] ?DATE OF BIRTH: 26-Jun-1966 ?FACILITY: [facility] ?LOCATION: [facility] ?PHYSICIAN: Yetta Barre. Marlou Sa, MD ? ?Operative Report  ? ?DATE OF PROCEDURE: 11/24/2021 ? ? ?PREOPERATIVE DIAGNOSES:  Right knee prepatellar bursitis, possibly infected. ? ?POSTOPERATIVE DIAGNOSES:  Right knee prepatellar bursitis, possibly infected. ? ?PROCEDURES:  Right knee prepatellar bursectomy. ? ?SURGEON:  Yetta Barre. Marlou Sa, MD ? ?ASSISTANT:  Annie Main, PA. ? ?INDICATIONS:  The patient is a 56 year old patient with refractory prepatellar bursitis and swelling that has failed conservative management including antibiotics, multiple aspirations and activity modification.  She presents now for operative management ? after explanation of risks and benefits. ? ?DESCRIPTION OF PROCEDURE:  The patient was brought to the operating room where general anesthetic was induced, preoperative antibiotics were held until cultures were obtained.  Right leg was prescrubbed with alcohol and Betadine, allowed to air dry,  ?Prepped with DuraPrep solution and draped in a sterile manner.  Ioban used to cover the operative field.  Timeout was called.  Leg was elevated and exsanguinated with the Esmarch wrap.  Tourniquet was inflated.  Total tourniquet time 60 minutes.   ?Longitudinal incision was made over the prepatellar bursa.  Serosanguineous fluid was present.  This was sent for culture x2.  Very thick bursa was present over the patella extending over the patellar tendon.  The bursa was meticulously excised, taking  ?care to not violate the overlying skin.  Bursa was sent to pathology.  Bursa was inflamed.  Complete bursectomy performed.  At this time, thorough irrigation was performed.  Tourniquet was released and bleeding points encountered controlled using  ?electrocautery.  Next, 3-0 Vicryl was used to tack down the fascia to the underlying subdermal tissue to close down the dead space.   Vancomycin powder placed.  Once the dead space was closed, the skin was loosely reapproximated using inverted 2-0 Vicryl  ?suture followed by 3-0 nylon suture.  Aquacel dressing placed.  Numbing medicine placed along the skin edges for postoperative pain relief.  The patient tolerated the procedure well without immediate complications, Ace wrap and knee immobilizer placed.   ?Luke's assistance was required at all times for retraction, opening, closing, mobilization of tissue.  His assistance was a medical necessity.  ? ? ? ?[place] ?D: 11/24/2021 8:43:44 pm T: 11/24/2021 10:37:00 pm  ?JOB: [id number]/ [id number]  ?

## 2021-11-24 NOTE — Progress Notes (Signed)
Pacu Discharge Note ? ?Patient instuctions were given to family. Wound care, diet, pain, follow up care and how and whom to contact with concerns were discussed. Family aware someone needs to remain with patient overnight and concerns after receiving anesthesia and what to avoid and safety. Answered all questions and concerns.  ? ?Pt aware to take daily ASA, wear knee immobilizer at all times except while showering.  ? ?Discharge paperwork has clear contact informations for surgeon and 24 hour RN line for concerns.  ? ?Discussed what concerns to look for including infection and signs/symptoms to look for.  ? ?IV was removed prior to discharge. Patient was brought to car with belongings.  ? ?Pt exits my care.   ?

## 2021-11-24 NOTE — Anesthesia Preprocedure Evaluation (Signed)
Anesthesia Evaluation  ?Patient identified by MRN, date of birth, ID band ?Patient awake ? ? ? ?Reviewed: ?Allergy & Precautions, NPO status , Patient's Chart, lab work & pertinent test results ? ?History of Anesthesia Complications ?(+) PONV and history of anesthetic complications ? ?Airway ?Mallampati: II ? ?TM Distance: >3 FB ?Neck ROM: Full ? ? ? Dental ? ?(+) Teeth Intact, Dental Advisory Given, Caps,  ?  ?Pulmonary ?neg pulmonary ROS,  ?  ?Pulmonary exam normal ?breath sounds clear to auscultation ? ? ? ? ? ? Cardiovascular ?hypertension, Pt. on medications ?(-) angina(-) Past MI Normal cardiovascular exam ?Rhythm:Regular Rate:Normal ? ? ?  ?Neuro/Psych ?PSYCHIATRIC DISORDERS Anxiety Depression negative neurological ROS ?   ? GI/Hepatic ?Neg liver ROS, GERD  Medicated and Controlled,  ?Endo/Other  ?Hypothyroidism  ? Renal/GU ?negative Renal ROS  ? ?  ?Musculoskeletal ? ?(+) Arthritis , right knee prepatellar bursitis  ? Abdominal ?  ?Peds ? Hematology ?negative hematology ROS ?(+)   ?Anesthesia Other Findings ?Day of surgery medications reviewed with the patient. ? Reproductive/Obstetrics ? ?  ? ? ? ? ? ? ? ? ? ? ? ? ? ?  ?  ? ? ? ? ? ? ? ? ?Anesthesia Physical ?Anesthesia Plan ? ?ASA: 2 ? ?Anesthesia Plan: General  ? ?Post-op Pain Management: Tylenol PO (pre-op)*  ? ?Induction: Intravenous ? ?PONV Risk Score and Plan: 3 and Midazolam, Dexamethasone, Ondansetron, Scopolamine patch - Pre-op and TIVA ? ?Airway Management Planned: LMA ? ?Additional Equipment:  ? ?Intra-op Plan:  ? ?Post-operative Plan: Extubation in OR ? ?Informed Consent: I have reviewed the patients History and Physical, chart, labs and discussed the procedure including the risks, benefits and alternatives for the proposed anesthesia with the patient or authorized representative who has indicated his/her understanding and acceptance.  ? ? ? ?Dental advisory given ? ?Plan Discussed with: CRNA ? ?Anesthesia Plan  Comments:   ? ? ? ? ? ? ?Anesthesia Quick Evaluation ? ?

## 2021-11-24 NOTE — Brief Op Note (Signed)
? ?  11/24/2021 ? ?8:39 PM ? ?PATIENT:  [name]  56 y.o. male ? ?PRE-OPERATIVE DIAGNOSIS:  right knee prepatellar bursitis ? ?POST-OPERATIVE DIAGNOSIS:  right knee prepatellar bursitis ? ?PROCEDURE:  Procedure(s): ?right knee bursectomy ? ?SURGEON:  Surgeon(s): ?Meredith Pel, MD ? ?ASSISTANT: magnant pa ? ?ANESTHESIA:   general ? ?EBL: 25 ml   ? ?Total I/O ?In: 200 [I.V.:100; IV Piggyback:100] ?Out: 50 [Blood:50] ? ?BLOOD ADMINISTERED: none ? ?DRAINS: none  ? ?LOCAL MEDICATIONS USED:  marcaine mso4 clonidine ? ?SPECIMEN:  No Specimen ? ?COUNTS:  YES ? ?TOURNIQUET:   ?Total Tourniquet Time Documented: ?Thigh (Right) - 16 minutes ?Total: Thigh (Right) - 16 minutes ? ? ?DICTATION: .Other Dictation: Dictation Number [id number] ? ?PLAN OF CARE: Discharge to home after PACU ? ?PATIENT DISPOSITION:  PACU - hemodynamically stable ? ? ? ? ? ? ? ? ? ? ? ? ?  ?

## 2021-11-26 NOTE — Anesthesia Postprocedure Evaluation (Signed)
Anesthesia Post Note ? ?Patient: [name] [facility] ? ?Procedure(s) Performed: right knee bursectomy (Right: Knee) ? ?  ? ?Patient location during evaluation: PACU ?Anesthesia Type: General ?Level of consciousness: awake and alert ?Pain management: pain level controlled ?Vital Signs Assessment: post-procedure vital signs reviewed and stable ?Respiratory status: spontaneous breathing, nonlabored ventilation, respiratory function stable and patient connected to nasal cannula oxygen ?Cardiovascular status: blood pressure returned to baseline and stable ?Postop Assessment: no apparent nausea or vomiting ?Anesthetic complications: no ? ? ?No notable events documented. ? ?Last Vitals:  ?Vitals:  ? 11/24/21 2135 11/24/21 2140  ?BP: 133/88 135/87  ?Pulse:  65  ?Resp:  14  ?Temp: 36.5 ?C   ?SpO2:  96%  ?  ?Last Pain:  ?Vitals:  ? 11/24/21 2140  ?TempSrc:   ?PainSc: 1   ? ? ?  ?  ?  ?  ?  ?  ? ?[place] S ? ? ? ? ?

## 2021-11-27 ENCOUNTER — Encounter (HOSPITAL_COMMUNITY): Payer: Self-pay | Admitting: Orthopedic Surgery

## 2021-11-28 LAB — SURGICAL PATHOLOGY

## 2021-11-29 LAB — AEROBIC/ANAEROBIC CULTURE W GRAM STAIN (SURGICAL/DEEP WOUND): Culture: NO GROWTH

## 2021-11-30 LAB — AEROBIC/ANAEROBIC CULTURE W GRAM STAIN (SURGICAL/DEEP WOUND)
Culture: NO GROWTH
Gram Stain: NONE SEEN

## 2021-12-01 ENCOUNTER — Ambulatory Visit (INDEPENDENT_AMBULATORY_CARE_PROVIDER_SITE_OTHER): Payer: 59 | Admitting: Surgical

## 2021-12-01 ENCOUNTER — Other Ambulatory Visit: Payer: Self-pay

## 2021-12-01 DIAGNOSIS — M7041 Prepatellar bursitis, right knee: Secondary | ICD-10-CM

## 2021-12-02 ENCOUNTER — Encounter: Payer: Self-pay | Admitting: Surgical

## 2021-12-02 DIAGNOSIS — M7041 Prepatellar bursitis, right knee: Secondary | ICD-10-CM

## 2021-12-02 NOTE — Progress Notes (Signed)
? ?Post-Op Visit Note ?  ?Patient: [name] [facility]           ?Date of Birth: [birth year]           ?MRN: [id number] ?Visit Date: 12/01/2021 ?PCP: Dorothyann Peng, NP ? ? ?Assessment & Plan: ? ?Chief Complaint:  ?Chief Complaint  ?Patient presents with  ? Right Knee - Routine Post Op  ?  11/24/21 (7d) right knee bursectomy  ? ?  ? ?Visit Diagnoses:  ?1. Prepatellar bursitis, right knee   ? ? ?Plan: Patient is a 56 year old male who presents s/p right knee prepatellar bursectomy on 11/24/2021 for chronic prepatellar bursitis.  He is overall doing well with no issues.  He has been ambulating full weightbearing with knee immobilizer.  He has returned to work.  Cultures were taken at the time of the procedure that have had no growth.  Pathology specimen has shown acute and chronic bursitis with no other concerning elements.  On exam, incision looks to be healing well.  Sutures are intact and there is no surrounding cellulitis or gapping of the incision.  He has no calf tenderness.  Negative Homans' sign.  Able to perform straight leg raise without any extensor lag or any difficulty.  Denies any complaint of chest pain or shortness of breath.  He has no fevers or chills.  He has finished his course of postop antibiotics.  Overall feels well.  Recommended he continue with returning to work but wear the knee immobilizer when he is at work.  He will return on Wednesday for suture removal which will mark 12 days out from procedure.  Okay for range of motion from 0 degrees to 45 degrees.  Patient agreed with plan.  He will call the office with any concerns in the meantime.  Follow-up on Wednesday. ? ?Follow-Up Instructions: No follow-ups on file.  ? ?Orders:  ?No orders of the defined types were placed in this encounter. ? ?No orders of the defined types were placed in this encounter. ? ? ?Imaging: ?No results found. ? ?PMFS History: ?Patient Active Problem List  ? Diagnosis Date Noted  ? Depression 04/16/2018  ? Degenerative joint  disease of knee, 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  ? Elevated fasting blood sugar 10/06/2014  ? Reflux esophagitis 04/22/2012  ? HYPOGONADISM 09/19/2010  ? Hyperlipidemia 01/17/2010  ? ASTHMA 10/25/2008  ? ERECTILE DYSFUNCTION 10/08/2008  ? Essential hypertension 10/16/2007  ? Allergic rhinitis 05/13/2007  ? ?Past Medical History:  ?Diagnosis Date  ? ALLERGIC RHINITIS   ? ANXIETY   ? ASTHMA   ? BACK PAIN   ? ERECTILE DYSFUNCTION   ? GERD   ? HYPERLIPIDEMIA   ? HYPERTENSION   ? LUMBAR STRAIN   ? NECK PAIN   ? PANCREATITIS, HX OF   ? PONV (postoperative nausea and vomiting)   ? nausea only   ? Seasonal allergies   ? SHOULDER PAIN, LEFT   ? Substance abuse ([place])   ? quit in 2007  ? TENDINITIS, PATELLAR   ?  ?Family History  ?Problem Relation Age of Onset  ? Lung disease Father   ? Melanoma Mother   ? Heart attack Brother   ? Colon polyps Neg Hx   ? Colon cancer Neg Hx   ? Stomach cancer Neg Hx   ? Pancreatic cancer Neg Hx   ? Esophageal cancer Neg Hx   ?  ?Past Surgical History:  ?Procedure Laterality Date  ? IMAGE GUIDED SINUS SURGERY N/A 04/13/2015  ? Procedure: IMAGE  GUIDED SINUS SURGERY;  Surgeon: Carloyn Manner, MD;  Location: [place];  Service: ENT;  Laterality: N/A;  GAVE DISK TO CE CE  ? KNEE ARTHROSCOPY Right 2008  ? KNEE BURSECTOMY Right 11/24/2021  ? Procedure: right knee bursectomy;  Surgeon: Meredith Pel, MD;  Location: [employer];  Service: Orthopedics;  Laterality: Right;  ? KNEE SURGERY Left   ? x2  ? MAXILLARY ANTROSTOMY Bilateral 04/13/2015  ? Procedure: MAXILLARY ANTROSTOMY;  Surgeon: Carloyn Manner, MD;  Location: [place];  Service: ENT;  Laterality: Bilateral;  ? ?Social History  ? ?Occupational History  ? Not on file  ?Tobacco Use  ? Smoking status: Never  ? Smokeless tobacco: Current  ?  Types: Chew  ? Tobacco comments:  ?  form given 11/24/15  ?Vaping Use  ? Vaping Use: Never used  ?Substance and Sexual Activity  ? Alcohol use: No  ?  Alcohol/week: 0.0 standard drinks  ?  Comment: hx  alcohol abuse  ? Drug use: No  ? Sexual activity: Not on file  ? ? ? ?

## 2021-12-06 ENCOUNTER — Other Ambulatory Visit: Payer: Self-pay

## 2021-12-06 ENCOUNTER — Telehealth: Payer: Self-pay

## 2021-12-06 ENCOUNTER — Ambulatory Visit (INDEPENDENT_AMBULATORY_CARE_PROVIDER_SITE_OTHER): Payer: 59 | Admitting: Orthopedic Surgery

## 2021-12-06 DIAGNOSIS — M7041 Prepatellar bursitis, right knee: Secondary | ICD-10-CM

## 2021-12-06 NOTE — Telephone Encounter (Signed)
Auth needed for left knee gel injection  

## 2021-12-07 ENCOUNTER — Encounter: Payer: Self-pay | Admitting: Orthopedic Surgery

## 2021-12-07 NOTE — Progress Notes (Signed)
? ?Post-Op Visit Note ?  ?Patient: [name] [facility]           ?Date of Birth: 16-Apr-1966           ?MRN: [id number] ?Visit Date: 12/06/2021 ?PCP: Dorothyann Peng, NP ? ? ?Assessment & Plan: ? ?Chief Complaint:  ?Chief Complaint  ?Patient presents with  ? Right Knee - Routine Post Op  ?  11/24/21 (12d) right knee bursectomy  ? ?  ? ?Visit Diagnoses:  ?1. Prepatellar bursitis, right knee   ? ? ?Plan: [name] is a 56 year old patient who is now about 2 weeks out right knee bursectomy.  On examination the incision is intact.  No recurrence of fluid.  Patient has left knee arthritis as well.  We will preapproved left knee gel injection for the left knee and see him back at that time.  Cultures remain negative from the bursectomy. ? ?This patient is diagnosed with osteoarthritis of the knee(s).   ? ?Radiographs show evidence of joint space narrowing, osteophytes, subchondral sclerosis and/or subchondral cysts.  This patient has knee pain which interferes with functional and activities of daily living.   ? ?This patient has experienced inadequate response, adverse effects and/or intolerance with conservative treatments such as acetaminophen, NSAIDS, topical creams, physical therapy or regular exercise, knee bracing and/or weight loss.  ? ?This patient has experienced inadequate response or has a contraindication to intra articular steroid injections for at least 3 months.  ? ?This patient is not scheduled to have a total knee replacement within 6 months of starting treatment with viscosupplementation. ? ? ?Follow-Up Instructions: Return if symptoms worsen or fail to improve.  ? ?Orders:  ?No orders of the defined types were placed in this encounter. ? ?No orders of the defined types were placed in this encounter. ? ? ?Imaging: ?No results found. ? ?PMFS History: ?Patient Active Problem List  ? Diagnosis Date Noted  ? Prepatellar bursitis, right knee   ? Depression 04/16/2018  ? Degenerative joint disease of knee, 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   ? Elevated fasting blood sugar 10/06/2014  ? Reflux esophagitis 04/22/2012  ? HYPOGONADISM 09/19/2010  ? Hyperlipidemia 01/17/2010  ? ASTHMA 10/25/2008  ? ERECTILE DYSFUNCTION 10/08/2008  ? Essential hypertension 10/16/2007  ? Allergic rhinitis 05/13/2007  ? ?Past Medical History:  ?Diagnosis Date  ? ALLERGIC RHINITIS   ? ANXIETY   ? ASTHMA   ? BACK PAIN   ? ERECTILE DYSFUNCTION   ? GERD   ? HYPERLIPIDEMIA   ? HYPERTENSION   ? LUMBAR STRAIN   ? NECK PAIN   ? PANCREATITIS, HX OF   ? PONV (postoperative nausea and vomiting)   ? nausea only   ? Seasonal allergies   ? SHOULDER PAIN, LEFT   ? Substance abuse ([place])   ? quit in 2007  ? TENDINITIS, PATELLAR   ?  ?Family History  ?Problem Relation Age of Onset  ? Lung disease Father   ? Melanoma Mother   ? Heart attack Brother   ? Colon polyps Neg Hx   ? Colon cancer Neg Hx   ? Stomach cancer Neg Hx   ? Pancreatic cancer Neg Hx   ? Esophageal cancer Neg Hx   ?  ?Past Surgical History:  ?Procedure Laterality Date  ? IMAGE GUIDED SINUS SURGERY N/A 04/13/2015  ? Procedure: IMAGE GUIDED SINUS SURGERY;  Surgeon: Carloyn Manner, MD;  Location: [place];  Service: ENT;  Laterality: N/A;  GAVE DISK TO CE CE  ? KNEE ARTHROSCOPY Right  2008  ? KNEE BURSECTOMY Right 11/24/2021  ? Procedure: right knee bursectomy;  Surgeon: Meredith Pel, MD;  Location: [place];  Service: Orthopedics;  Laterality: Right;  ? KNEE SURGERY Left   ? x2  ? MAXILLARY ANTROSTOMY Bilateral 04/13/2015  ? Procedure: MAXILLARY ANTROSTOMY;  Surgeon: Carloyn Manner, MD;  Location: [place];  Service: ENT;  Laterality: Bilateral;  ? ?Social History  ? ?Occupational History  ? Not on file  ?Tobacco Use  ? Smoking status: Never  ? Smokeless tobacco: Current  ?  Types: Chew  ? Tobacco comments:  ?  form given 11/24/15  ?Vaping Use  ? Vaping Use: Never used  ?Substance and Sexual Activity  ? Alcohol use: No  ?  Alcohol/week: 0.0 standard drinks  ?  Comment: hx alcohol abuse  ? Drug use: No  ?  Sexual activity: Not on file  ? ? ? ?

## 2021-12-10 ENCOUNTER — Other Ambulatory Visit: Payer: Self-pay | Admitting: Adult Health

## 2021-12-11 NOTE — Telephone Encounter (Signed)
Noted  

## 2021-12-12 NOTE — Telephone Encounter (Signed)
Is patient WC? If so, Dr. Marlou Sa wants patient to have a gel injection in left knee.  Please advise.  Thank you. ?

## 2021-12-14 NOTE — Telephone Encounter (Signed)
Noted! Thank you

## 2021-12-20 ENCOUNTER — Ambulatory Visit (INDEPENDENT_AMBULATORY_CARE_PROVIDER_SITE_OTHER): Payer: 59 | Admitting: Surgical

## 2021-12-20 ENCOUNTER — Encounter: Payer: Self-pay | Admitting: Orthopedic Surgery

## 2021-12-20 ENCOUNTER — Telehealth: Payer: Self-pay

## 2021-12-20 DIAGNOSIS — M7041 Prepatellar bursitis, right knee: Secondary | ICD-10-CM

## 2021-12-20 NOTE — Telephone Encounter (Signed)
Please provide update for gel injection auth to patient.  ?

## 2021-12-20 NOTE — Telephone Encounter (Signed)
Talked with patient and advised him that insurance does not cover gel injections.  Patient voiced that he understands. ? ?Did advise patient of TriVisc , but patient declined at the moment and will call back when he is ready to proceed.  ?

## 2021-12-20 NOTE — Progress Notes (Signed)
? ?Post-Op Visit Note ?  ?Patient: [name] [facility]           ?Date of Birth: March 04, 1966           ?MRN: [id number] ?Visit Date: 12/20/2021 ?PCP: Dorothyann Peng, NP ? ? ?Assessment & Plan: ? ?Chief Complaint:  ?Chief Complaint  ?Patient presents with  ? Right Knee - Routine Post Op  ?  11/24/21 ight knee bursectomy   ? ?Visit Diagnoses:  ?1. Prepatellar bursitis, right knee   ? ? ?Plan: Patient is a 56 year old male who presents s/p right knee bursectomy of prepatellar bursa on 11/24/2021.  He is doing well and range of motion is improving.  He returns with some concern over his knee after he noticed a little bit of increased warmth and swelling last Sunday.  His wife noticed it 2.  He denies any fevers, chills, night sweats, drainage, calf pain, chest pain, shortness of breath.  Incision looks to be healing well on exam.  There is no recurrence of fluid in the prepatellar bursa.  No effusion noted.  No increased warmth.  There is a little bit of redness but this improves with elevation of the leg.  No calf tenderness.  Negative Homans' sign.  Range of motion from 0 to 120 degrees of knee flexion.  Plan is continue with working on range of motion.  There was 1 suture that was removed today that was left over from prior suture removal.  He will follow-up with the office as needed.  We are waiting for gel injection approval for the other knee. ? ?Follow-Up Instructions: No follow-ups on file.  ? ?Orders:  ?No orders of the defined types were placed in this encounter. ? ?No orders of the defined types were placed in this encounter. ? ? ?Imaging: ?No results found. ? ?PMFS History: ?Patient Active Problem List  ? Diagnosis Date Noted  ? Prepatellar bursitis, right knee   ? Depression 04/16/2018  ? 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 Elevated fasting blood sugar 10/06/2014  ? Reflux esophagitis 04/22/2012  ? HYPOGONADISM 09/19/2010  ? Hyperlipidemia 01/17/2010  ? ASTHMA 10/25/2008  ? ERECTILE DYSFUNCTION  10/08/2008  ? Essential hypertension 10/16/2007  ? Allergic rhinitis 05/13/2007  ? ?Past Medical History:  ?Diagnosis Date  ? ALLERGIC RHINITIS   ? ANXIETY   ? ASTHMA   ? BACK PAIN   ? ERECTILE DYSFUNCTION   ? GERD   ? HYPERLIPIDEMIA   ? HYPERTENSION   ? LUMBAR STRAIN   ? NECK PAIN   ? PANCREATITIS, HX OF   ? PONV (postoperative nausea and vomiting)   ? nausea only   ? Seasonal allergies   ? SHOULDER PAIN, LEFT   ? Substance abuse ([place])   ? quit in 2007  ? TENDINITIS, PATELLAR   ?  ?Family History  ?Problem Relation Age of Onset  ? Lung disease Father   ? Melanoma Mother   ? Heart attack Brother   ? Colon polyps Neg Hx   ? Colon cancer Neg Hx   ? Stomach cancer Neg Hx   ? Pancreatic cancer Neg Hx   ? Esophageal cancer Neg Hx   ?  ?Past Surgical History:  ?Procedure Laterality Date  ? IMAGE GUIDED SINUS SURGERY N/A 04/13/2015  ? Procedure: IMAGE GUIDED SINUS SURGERY;  Surgeon: Carloyn Manner, MD;  Location: [place];  Service: ENT;  Laterality: N/A;  GAVE DISK TO CE CE  ? KNEE ARTHROSCOPY Right 2008  ?  KNEE BURSECTOMY Right 11/24/2021  ? Procedure: right knee bursectomy;  Surgeon: Meredith Pel, MD;  Location: [place];  Service: Orthopedics;  Laterality: Right;  ? KNEE SURGERY Left   ? x2  ? MAXILLARY ANTROSTOMY Bilateral 04/13/2015  ? Procedure: MAXILLARY ANTROSTOMY;  Surgeon: Carloyn Manner, MD;  Location: [place];  Service: ENT;  Laterality: Bilateral;  ? ?Social History  ? ?Occupational History  ? Not on file  ?Tobacco Use  ? Smoking status: Never  ? Smokeless tobacco: Current  ?  Types: Chew  ? Tobacco comments:  ?  form given 11/24/15  ?Vaping Use  ? Vaping Use: Never used  ?Substance and Sexual Activity  ? Alcohol use: No  ?  Alcohol/week: 0.0 standard drinks  ?  Comment: hx alcohol abuse  ? Drug use: No  ? Sexual activity: Not on file  ? ? ? ?

## 2022-01-03 ENCOUNTER — Other Ambulatory Visit: Payer: Self-pay | Admitting: Adult Health

## 2022-01-08 ENCOUNTER — Ambulatory Visit (INDEPENDENT_AMBULATORY_CARE_PROVIDER_SITE_OTHER): Payer: 59 | Admitting: Family Medicine

## 2022-01-08 ENCOUNTER — Encounter: Payer: Self-pay | Admitting: Family Medicine

## 2022-01-08 VITALS — BP 134/80 | HR 86 | Temp 97.9°F | Ht 74.0 in | Wt 213.7 lb

## 2022-01-08 DIAGNOSIS — M542 Cervicalgia: Secondary | ICD-10-CM

## 2022-01-08 MED ORDER — PREDNISONE 20 MG PO TABS: ORAL_TABLET | ORAL | 0 refills | Status: DC

## 2022-01-08 NOTE — Progress Notes (Signed)
? ?Established Patient Office Visit ? ?Subjective   ?Patient ID: [name] [facility], male    DOB: [birth year]  Age: 56 y.o. MRN: [id number] ? ?Chief Complaint  ?Patient presents with  ? Neck Pain  ?  Patient complains of neck pain,x1 day, Tried Ibuprofen with little relief   ? ? ?HPI ? ? ?Seen with left cervical neck pain.  Started Sunday when he first woke up.  Denies any injury.  He wonders if he may have slept wrong.  He tried some Aleve with slight relief.  Also had some leftover Flexeril which she took earlier today.  No radiculitis symptoms.  Pain is worse with rotation to the left side.  Slightly restricted range of motion.  No fever.  No adenopathy.  No sore throat symptoms.  No upper extremity weakness. ? ?Past Medical History:  ?Diagnosis Date  ? ALLERGIC RHINITIS   ? ANXIETY   ? ASTHMA   ? BACK PAIN   ? ERECTILE DYSFUNCTION   ? GERD   ? HYPERLIPIDEMIA   ? HYPERTENSION   ? LUMBAR STRAIN   ? NECK PAIN   ? PANCREATITIS, HX OF   ? PONV (postoperative nausea and vomiting)   ? nausea only   ? Seasonal allergies   ? SHOULDER PAIN, LEFT   ? Substance abuse ([place])   ? quit in 2007  ? TENDINITIS, PATELLAR   ? ?Past Surgical History:  ?Procedure Laterality Date  ? IMAGE GUIDED SINUS SURGERY N/A 04/13/2015  ? Procedure: IMAGE GUIDED SINUS SURGERY;  Surgeon: Carloyn Manner, MD;  Location: [place];  Service: ENT;  Laterality: N/A;  GAVE DISK TO CE CE  ? KNEE ARTHROSCOPY Right 2008  ? KNEE BURSECTOMY Right 11/24/2021  ? Procedure: right knee bursectomy;  Surgeon: Meredith Pel, MD;  Location: [place];  Service: Orthopedics;  Laterality: Right;  ? KNEE SURGERY Left   ? x2  ? MAXILLARY ANTROSTOMY Bilateral 04/13/2015  ? Procedure: MAXILLARY ANTROSTOMY;  Surgeon: Carloyn Manner, MD;  Location: [place];  Service: ENT;  Laterality: Bilateral;  ? ? reports that he has never smoked. His smokeless tobacco use includes chew. He reports that he does not drink alcohol and does not use drugs. ?family history  includes Heart attack in his brother; Lung disease in his father; Melanoma in his mother. ?Allergies  ?Allergen Reactions  ? Aspirin Nausea And Vomiting  ? ? ?Review of Systems  ?Cardiovascular:  Negative for chest pain.  ?Musculoskeletal:  Positive for neck pain.  ?Neurological:  Negative for weakness.  ? ?  ?Objective:  ?  ? ?BP 134/80 (BP Location: Left Arm, Patient Position: Sitting, Cuff Size: Normal)   Pulse 86   Temp 97.9 ?F (36.6 ?C) (Oral)   Ht '6\' 2"'$  (1.88 m)   Wt 213 lb 11.2 oz (96.9 kg)   SpO2 98%   BMI 27.44 kg/m?  ? ? ?Physical Exam ?Vitals reviewed.  ?Constitutional:   ?   Appearance: Normal appearance.  ?Neck:  ?   Comments: No cervical adenopathy noted.  No spinal tenderness.  No trapezius tenderness.  Fairly good range of motion with flexion and extension.  Does have some pain with rotation to the left ?Cardiovascular:  ?   Rate and Rhythm: Normal rate and regular rhythm.  ?Neurological:  ?   Mental Status: He is alert.  ?   Comments: Full strength upper extremities.  No upper extremity numbness  ? ? ? ?No results found for any visits on 01/08/22. ? ? ? ?  The 10-year ASCVD risk score (Arnett DK, et al., 2019) is: 5.8% ? ?  ?Assessment & Plan:  ? ?Problem List Items Addressed This Visit   ?None ?Visit Diagnoses   ? ? Neck pain    -  Primary  ? ?  ?Continue Flexeril 10 mg nightly ?-Trial of prednisone 20 mg 2 tablets daily for 6 days ?-Muscle massage and heat or ice for symptom relief ? ?No follow-ups on file.  ? ? ?Carolann Littler, MD ? ?

## 2022-01-15 ENCOUNTER — Ambulatory Visit (INDEPENDENT_AMBULATORY_CARE_PROVIDER_SITE_OTHER): Payer: 59 | Admitting: *Deleted

## 2022-01-15 DIAGNOSIS — Z23 Encounter for immunization: Secondary | ICD-10-CM

## 2022-01-22 ENCOUNTER — Other Ambulatory Visit: Payer: Self-pay | Admitting: Adult Health

## 2022-01-23 NOTE — Telephone Encounter (Signed)
Please see note from pharmacy below in yellow ?

## 2022-01-31 ENCOUNTER — Encounter: Payer: Self-pay | Admitting: Adult Health

## 2022-01-31 MED ORDER — HYDROCORTISONE (PERIANAL) 2.5 % EX CREA: 1.0000 "application " | TOPICAL_CREAM | Freq: Two times a day (BID) | CUTANEOUS | 3 refills | Status: DC

## 2022-02-20 MED FILL — Clonidine HCl Inj (For Epidural Infusion) 100 MCG/ML: EPIDURAL | Qty: 10 | Status: AC

## 2022-02-28 ENCOUNTER — Telehealth (INDEPENDENT_AMBULATORY_CARE_PROVIDER_SITE_OTHER): Payer: 59 | Admitting: Adult Health

## 2022-02-28 ENCOUNTER — Ambulatory Visit: Payer: 59 | Admitting: Adult Health

## 2022-02-28 ENCOUNTER — Encounter: Payer: Self-pay | Admitting: Adult Health

## 2022-02-28 VITALS — Ht 74.0 in | Wt 210.0 lb

## 2022-02-28 DIAGNOSIS — J0140 Acute pansinusitis, unspecified: Secondary | ICD-10-CM | POA: Diagnosis not present

## 2022-02-28 DIAGNOSIS — R051 Acute cough: Secondary | ICD-10-CM

## 2022-02-28 MED ORDER — DOXYCYCLINE HYCLATE 100 MG PO CAPS: 100.0000 mg | ORAL_CAPSULE | Freq: Two times a day (BID) | ORAL | 0 refills | Status: DC

## 2022-02-28 MED ORDER — HYDROCODONE BIT-HOMATROP MBR 5-1.5 MG/5ML PO SOLN: 5.0000 mL | Freq: Three times a day (TID) | ORAL | 0 refills | Status: DC | PRN

## 2022-02-28 NOTE — Progress Notes (Signed)
Virtual Visit via Video Note  I connected with [facility] on 02/28/22 at  3:00 PM EDT by a video enabled telemedicine application and verified that I am speaking with the correct person using two identifiers.  Location patient: home Location provider:work or home office Persons participating in the virtual visit: patient, provider  I discussed the limitations of evaluation and management by telemedicine and the availability of in person appointments. The patient expressed understanding and agreed to proceed.   HPI: 56 year old male who is being evaluated today for an acute issue.  His symptoms started less than a week ago.  Symptoms include nasal congestion, sinus pain and pressure, headache, and a productive cough.  He denies fevers, chills, sore throat, ear pain, or body aches.  His wife has had the same symptoms for slightly longer timeframe and was recently started on antibiotics.  He also has a granddaughter that is sick with the same symptoms   ROS: See pertinent positives and negatives per HPI.  Past Medical History:  Diagnosis Date   ALLERGIC RHINITIS    ANXIETY    ASTHMA    BACK PAIN    ERECTILE DYSFUNCTION    GERD    HYPERLIPIDEMIA    HYPERTENSION    LUMBAR STRAIN    NECK PAIN    PANCREATITIS, HX OF    PONV (postoperative nausea and vomiting)    nausea only    Seasonal allergies    SHOULDER PAIN, LEFT    Substance abuse ([place])    quit in 2007   TENDINITIS, PATELLAR     Past Surgical History:  Procedure Laterality Date   IMAGE GUIDED SINUS SURGERY N/A 04/13/2015   Procedure: IMAGE GUIDED SINUS SURGERY;  Surgeon: Carloyn Manner, MD;  Location: [place];  Service: ENT;  Laterality: N/A;  GAVE DISK TO CE CE   KNEE ARTHROSCOPY Right 2008   KNEE BURSECTOMY Right 11/24/2021   Procedure: right knee bursectomy;  Surgeon: Meredith Pel, MD;  Location: [place];  Service: Orthopedics;  Laterality: Right;   KNEE SURGERY Left    x2   MAXILLARY ANTROSTOMY  Bilateral 04/13/2015   Procedure: MAXILLARY ANTROSTOMY;  Surgeon: Carloyn Manner, MD;  Location: [place];  Service: ENT;  Laterality: Bilateral;    Family History  Problem Relation Age of Onset   Lung disease Father    Melanoma Mother    Heart attack Brother    Colon polyps Neg Hx    Colon cancer Neg Hx    Stomach cancer Neg Hx    Pancreatic cancer Neg Hx    Esophageal cancer Neg Hx        Current Outpatient Medications:    Omega-3 Fatty Acids (FISH OIL) 1200 MG CAPS, , Disp: , Rfl:    Probiotic Product (PROBIOTIC DAILY PO), , Disp: , Rfl:    Turmeric (QC TUMERIC COMPLEX) 500 MG CAPS, , Disp: , Rfl:    Artificial Tear Solution (SOOTHE XP) SOLN, Place 1 drop into both eyes daily., Disp: , Rfl:    buPROPion ER (WELLBUTRIN SR) 100 MG 12 hr tablet, TAKE TWO TABLETS BY MOUTH EVERY MORNING, Disp: 180 tablet, Rfl: 1   cyclobenzaprine (FLEXERIL) 10 MG tablet, TAKE ONE TABLET BY MOUTH AT BEDTIME, Disp: 90 tablet, Rfl: 0   fluticasone (FLONASE) 50 MCG/ACT nasal spray, SPRAY 1 SPRAY INTO EACH NOSTRIL EVERY DAY, Disp: 48 g, Rfl: 1   guaiFENesin (MUCINEX) 600 MG 12 hr tablet, Take 600 mg by mouth 2 (two) times daily  as needed (congestion)., Disp: , Rfl:    hydrocortisone (ANUSOL-HC) 2.5 % rectal cream, Place 1 application. rectally 2 (two) times daily., Disp: 30 g, Rfl: 3   levothyroxine (SYNTHROID) 75 MCG tablet, Take 1 tablet (75 mcg total) by mouth daily before breakfast., Disp: 90 tablet, Rfl: 3   losartan (COZAAR) 100 MG tablet, TAKE ONE TABLET BY MOUTH DAILY, Disp: 90 tablet, Rfl: 3   lovastatin (MEVACOR) 40 MG tablet, Take 1 tablet (40 mg total) by mouth at bedtime., Disp: 90 tablet, Rfl: 3   meloxicam (MOBIC) 15 MG tablet, TAKE ONE TABLET BY MOUTH DAILY AS NEEDED FOR PAIN, Disp: 90 tablet, Rfl: 1   Multiple Vitamin (MULTIVITAMIN WITH MINERALS) TABS tablet, Take 1 tablet by mouth daily., Disp: , Rfl:    omeprazole (PRILOSEC) 20 MG capsule, TAKE ONE CAPSULE BY MOUTH DAILY,  Disp: 90 capsule, Rfl: 3   sildenafil (VIAGRA) 100 MG tablet, TAKE ONE TABLET BY MOUTH DAILY AS NEEDED FOR ERECTILE DYSFUNCTION, Disp: 15 tablet, Rfl: 3   traMADol (ULTRAM) 50 MG tablet, Take 1 tablet (50 mg total) by mouth 3 (three) times daily., Disp: 90 tablet, Rfl: 2  EXAM:  VITALS per patient if applicable:  GENERAL: alert, oriented, appears well and in no acute distress  HEENT: atraumatic, conjunttiva clear, no obvious abnormalities on inspection of external nose and ears  NECK: normal movements of the head and neck  LUNGS: on inspection no signs of respiratory distress, breathing rate appears normal, no obvious gross SOB, gasping or wheezing  CV: no obvious cyanosis  MS: moves all visible extremities without noticeable abnormality  PSYCH/NEURO: pleasant and cooperative, no obvious depression or anxiety, speech and thought processing grossly intact  ASSESSMENT AND PLAN:  Discussed the following assessment and plan: 1. Acute non-recurrent pansinusitis  - doxycycline (VIBRAMYCIN) 100 MG capsule; Take 1 capsule (100 mg total) by mouth 2 (two) times daily.  Dispense: 14 capsule; Refill: 0 - Follow up if no improvement in the next 2-3 days   2. Acute cough  - HYDROcodone bit-homatropine (HYCODAN) 5-1.5 MG/5ML syrup; Take 5 mLs by mouth every 8 (eight) hours as needed for cough.  Dispense: 120 mL; Refill: 0      I discussed the assessment and treatment plan with the patient. The patient was provided an opportunity to ask questions and all were answered. The patient agreed with the plan and demonstrated an understanding of the instructions.   The patient was advised to call back or seek an in-person evaluation if the symptoms worsen or if the condition fails to improve as anticipated.   Dorothyann Peng, NP

## 2022-03-04 ENCOUNTER — Ambulatory Visit (HOSPITAL_COMMUNITY)
Admission: EM | Admit: 2022-03-04 | Discharge: 2022-03-04 | Disposition: A | Discharge status: Home / Self Care | Payer: 59 | Attending: Physician Assistant | Admitting: Physician Assistant

## 2022-03-04 ENCOUNTER — Encounter (HOSPITAL_COMMUNITY): Payer: Self-pay | Admitting: Emergency Medicine

## 2022-03-04 ENCOUNTER — Telehealth: Payer: Self-pay | Admitting: Family Medicine

## 2022-03-04 DIAGNOSIS — G9331 Postviral fatigue syndrome: Secondary | ICD-10-CM

## 2022-03-04 DIAGNOSIS — J069 Acute upper respiratory infection, unspecified: Secondary | ICD-10-CM

## 2022-03-04 DIAGNOSIS — J0140 Acute pansinusitis, unspecified: Secondary | ICD-10-CM

## 2022-03-04 DIAGNOSIS — J209 Acute bronchitis, unspecified: Secondary | ICD-10-CM

## 2022-03-04 MED ORDER — DOXYCYCLINE HYCLATE 100 MG PO CAPS: 100.0000 mg | ORAL_CAPSULE | Freq: Two times a day (BID) | ORAL | 0 refills | Status: DC

## 2022-03-04 MED ORDER — PREDNISONE 10 MG PO TABS: 10.0000 mg | ORAL_TABLET | Freq: Three times a day (TID) | ORAL | 0 refills | Status: DC

## 2022-03-04 NOTE — Telephone Encounter (Signed)
Weekend call from the nurse line  Feeling much worse  Body aches/? Fever Vomiting Taking the doxycycline  Adv to go to UC or ER now for evaluation

## 2022-03-05 ENCOUNTER — Ambulatory Visit (HOSPITAL_COMMUNITY): Payer: 59

## 2022-03-14 ENCOUNTER — Other Ambulatory Visit: Payer: Self-pay | Admitting: Adult Health

## 2022-03-24 ENCOUNTER — Encounter (HOSPITAL_COMMUNITY): Payer: Self-pay | Admitting: Emergency Medicine

## 2022-03-24 ENCOUNTER — Ambulatory Visit (HOSPITAL_COMMUNITY)
Admission: EM | Admit: 2022-03-24 | Discharge: 2022-03-24 | Disposition: A | Discharge status: Home / Self Care | Payer: 59 | Attending: Student | Admitting: Student

## 2022-03-24 DIAGNOSIS — J0100 Acute maxillary sinusitis, unspecified: Secondary | ICD-10-CM

## 2022-03-24 DIAGNOSIS — K219 Gastro-esophageal reflux disease without esophagitis: Secondary | ICD-10-CM | POA: Diagnosis not present

## 2022-03-24 MED ORDER — AMOXICILLIN-POT CLAVULANATE 875-125 MG PO TABS: 1.0000 | ORAL_TABLET | Freq: Two times a day (BID) | ORAL | 0 refills | Status: DC

## 2022-03-24 MED ORDER — LANSOPRAZOLE 30 MG PO CPDR: 30.0000 mg | DELAYED_RELEASE_CAPSULE | Freq: Every day | ORAL | 0 refills | Status: DC

## 2022-03-24 NOTE — ED Triage Notes (Signed)
Pt reports cough, congestion, nausea, sore throat, sinus pressure for 3 days.

## 2022-03-24 NOTE — Discharge Instructions (Addendum)
-  Start the antibiotic-Augmentin (amoxicillin-clavulanate), 1 pill every 12 hours for 7 days.  You can take this with food like with breakfast and dinner. -Stop omeprazole and start start lansoprazole daily -Limit acidic foods, spicy foods, ibuprofen/NSAIDs

## 2022-03-24 NOTE — ED Provider Notes (Signed)
[place]    CSN: [id number] Arrival date & time: 03/24/22  1212      History   Chief Complaint Chief Complaint  Patient presents with   Cough   Sore Throat   Nausea    HPI [name] is a 56 y.o. male presenting with cough, congestion, sore throat, epigastric burning.  History of GERD, currently taking daily Prilosec for this.  He was apparently on doxycycline for 2 months for right knee bursitis, and finished this about 2 weeks ago.  States the burning began after finishing the doxycycline, and is worse on empty stomach.  He states he had similar symptoms before when he was taking excessive ibuprofen, but currently is not taking excessive ibuprofen or eating spicy foods.  He also notes that he was sick with congestion about 3 weeks ago, felt better after prednisone, but is now having progressively worsening facial pressure radiating to his teeth.  He states he has had sinus surgery in the past and is prone to sinusitis.  Denies shortness of breath, chest pain, dizziness.  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   Elevated fasting blood sugar 10/06/2014   Reflux esophagitis 04/22/2012   HYPOGONADISM 09/19/2010   Hyperlipidemia 01/17/2010   ASTHMA 10/25/2008   ERECTILE DYSFUNCTION 10/08/2008   Essential hypertension 10/16/2007   Allergic rhinitis 05/13/2007    Past Surgical History:  Procedure Laterality Date   IMAGE GUIDED SINUS  SURGERY N/A 04/13/2015   Procedure: IMAGE GUIDED SINUS SURGERY;  Surgeon: Carloyn Manner, MD;  Location: [place];  Service: ENT;  Laterality: N/A;  GAVE DISK TO CE CE   KNEE ARTHROSCOPY Right 2008   KNEE BURSECTOMY Right 11/24/2021   Procedure: right knee bursectomy;  Surgeon: Meredith Pel, MD;  Location: [place];  Service: Orthopedics;  Laterality: Right;   KNEE SURGERY Left    x2   MAXILLARY ANTROSTOMY Bilateral 04/13/2015   Procedure: MAXILLARY ANTROSTOMY;  Surgeon: Carloyn Manner, MD;  Location: [place];  Service: ENT;  Laterality: Bilateral;       Home Medications    Prior to Admission medications   Medication Sig Start Date End Date Taking? Authorizing Provider  amoxicillin-clavulanate (AUGMENTIN) 875-125 MG tablet Take 1 tablet by mouth every 12 (twelve) hours. 03/24/22  Yes Hazel Sams, PA-C  lansoprazole (PREVACID) 30 MG capsule Take 1 capsule (30 mg total) by mouth daily at 12 noon. 03/24/22 04/23/22 Yes Hazel Sams, PA-C  Artificial Tear Solution (SOOTHE XP) SOLN Place 1 drop into both eyes daily.    [provider]  buPROPion ER [facility] SR) 100 MG 12 hr tablet TAKE TWO TABLETS BY MOUTH EVERY MORNING 10/23/21   Nafziger, Tommi Rumps, NP  cyclobenzaprine (FLEXERIL) 10 MG tablet TAKE ONE TABLET BY  MOUTH AT BEDTIME 03/14/22   Nafziger, Tommi Rumps, NP  fluticasone [facility]) 50 MCG/ACT nasal spray SPRAY 1 SPRAY INTO EACH NOSTRIL EVERY DAY 11/18/18   Nafziger, Tommi Rumps, NP  guaiFENesin (MUCINEX) 600 MG 12 hr tablet Take 600 mg by mouth 2 (two) times daily as needed (congestion).    [provider]  HYDROcodone bit-homatropine (HYCODAN) 5-1.5 MG/5ML syrup Take 5 mLs by mouth every 8 (eight) hours as needed for cough. 02/28/22   Nafziger, Tommi Rumps, NP  hydrocortisone (ANUSOL-HC) 2.5 % rectal cream Place 1 application. rectally 2 (two) times daily. 01/31/22   Nafziger, Tommi Rumps, NP  levothyroxine (SYNTHROID) 75 MCG tablet Take 1 tablet (75 mcg total) by mouth  daily before breakfast. 10/17/21   Nafziger, Tommi Rumps, NP  losartan (COZAAR) 100 MG tablet TAKE ONE TABLET BY MOUTH DAILY 08/15/21   Nafziger, Tommi Rumps, NP  lovastatin (MEVACOR) 40 MG tablet Take 1 tablet (40 mg total) by mouth at bedtime. 10/17/21   Nafziger, Tommi Rumps, NP  meloxicam (MOBIC) 15 MG tablet TAKE ONE TABLET BY MOUTH DAILY AS NEEDED FOR PAIN 01/03/22   Dorothyann Peng, NP  Multiple Vitamin (MULTIVITAMIN WITH MINERALS) TABS tablet Take 1 tablet by mouth daily.    [provider]  Omega-3 Fatty Acids (FISH OIL) 1200 MG CAPS  09/10/21   [provider]  omeprazole (PRILOSEC) 20 MG capsule TAKE ONE CAPSULE BY MOUTH DAILY 10/23/21   Nafziger, Tommi Rumps, NP  predniSONE (DELTASONE) 10 MG tablet Take 1 tablet (10 mg total) by mouth 3 (three) times daily. 03/04/22   Nyoka Lint, PA-C  Probiotic Product (PROBIOTIC DAILY PO)  09/10/17   [provider]  sildenafil (VIAGRA) 100 MG tablet TAKE ONE TABLET BY MOUTH DAILY AS NEEDED FOR ERECTILE DYSFUNCTION 03/09/21   Nafziger, Tommi Rumps, NP  traMADol (ULTRAM) 50 MG tablet Take 1 tablet (50 mg total) by mouth 3 (three) times daily. 01/23/22   Nafziger, Tommi Rumps, NP  Turmeric (QC TUMERIC COMPLEX) 500 MG CAPS  09/10/17   [provider]    Family History Family History  Problem Relation Age of Onset   Lung disease Father    Melanoma Mother    Heart attack Brother    Colon polyps Neg Hx    Colon cancer Neg Hx    Stomach cancer Neg Hx    Pancreatic cancer Neg Hx    Esophageal cancer Neg Hx     Social History Social History   Tobacco Use   Smoking status: Never   Smokeless tobacco: Current    Types: Chew   Tobacco comments:    form given 11/24/15  Vaping Use   Vaping Use: Never used  Substance Use Topics   Alcohol use: No    Alcohol/week: 0.0 standard drinks of alcohol    Comment: hx alcohol abuse   Drug use: No     Allergies   Aspirin   Review of Systems Review of Systems  Constitutional:  Negative for appetite change, chills and  fever.  HENT:  Positive for sinus pain. Negative for congestion, ear pain, rhinorrhea, sinus pressure and sore throat.   Eyes:  Negative for redness and visual disturbance.  Respiratory:  Negative for cough, chest tightness, shortness of breath and wheezing.   Cardiovascular:  Negative for chest pain and palpitations.  Gastrointestinal:  Positive for abdominal pain. Negative for constipation, diarrhea, nausea and vomiting.  Genitourinary:  Negative for dysuria, frequency and urgency.  Musculoskeletal:  Negative for myalgias.  Neurological:  Negative for dizziness, weakness and headaches.  Psychiatric/Behavioral:  Negative for confusion.   All other systems reviewed and are negative.    Physical Exam Triage Vital Signs ED Triage Vitals  Enc Vitals Group     BP 03/24/22 1243 125/83     Pulse Rate 03/24/22 1243 81     Resp 03/24/22 1243 17     Temp 03/24/22 1243 98.2 F (36.8 C)     Temp Source 03/24/22 1243 Oral     SpO2 03/24/22 1243 100 %     Weight --      Height --      Head Circumference --      Peak Flow --      Pain Score 03/24/22 1242 4     Pain Loc --      Pain Edu? --      Excl. in [place]? --    No data found.  Updated Vital Signs BP 125/83 (BP Location: Left Arm)   Pulse 81   Temp 98.2 F (36.8 C) (Oral)   Resp 17   SpO2 100%   Visual Acuity Right Eye Distance:   Left Eye Distance:   Bilateral Distance:    Right Eye Near:   Left Eye Near:    Bilateral Near:     Physical Exam Vitals reviewed.  Constitutional:      General: He is not in acute distress.    Appearance: Normal appearance. He is not ill-appearing.  HENT:     Head: Normocephalic and atraumatic.     Nose:     Right Sinus: Maxillary sinus tenderness present.     Left Sinus: Maxillary sinus tenderness present.     Mouth/Throat:     Mouth: Mucous membranes are moist.     Pharynx: Oropharynx is clear. Uvula midline. No posterior oropharyngeal erythema.     Comments: Moist mucous  membranes Eyes:     Extraocular Movements: Extraocular movements intact.     Pupils: Pupils are equal, round, and reactive to light.  Cardiovascular:     Rate and Rhythm: Normal rate and regular rhythm.     Heart sounds: Normal heart sounds.  Pulmonary:     Effort: Pulmonary effort is normal.     Breath sounds: Normal breath sounds. No wheezing, rhonchi or rales.  Abdominal:     General: Bowel sounds are normal. There is no distension.     Palpations: Abdomen is soft. There is no mass.     Tenderness: There is abdominal tenderness in the epigastric area. There is no right CVA tenderness, left CVA tenderness, guarding or rebound. Negative signs include Murphy's sign, Rovsing's sign and McBurney's sign.     Comments: Minimally tender epigastric region without guarding or rebound. Comfortable throughout exam .  Skin:    General: Skin is warm.     Capillary Refill: Capillary refill takes less than 2 seconds.     Comments: Good skin turgor  Neurological:     General: No focal deficit present.     Mental Status: He is alert and oriented to person, place, and time.  Psychiatric:        Mood and Affect: Mood normal.        Behavior: Behavior normal.      UC Treatments / Results  Labs (all labs ordered are listed, but only abnormal results are displayed) Labs Reviewed - No data to display  EKG   Radiology No results found.  Procedures Procedures (including critical care time)  Medications Ordered in UC Medications - No data  to display  Initial Impression / Assessment and Plan / UC Course  I have reviewed the triage vital signs and the nursing notes.  Pertinent labs & imaging results that were available during my care of the patient were reviewed by me and considered in my medical decision making (see chart for details).     This patient is a very pleasant 56 y.o. year old male presenting with GERD and sinusitis. Afebrile, nontachy.  Suspect the exacerbation of chronic GERD  is related to doxycycline for 2 months, which he recently finished.  We will stop the Prilosec and start lansoprazole for 1 month.  Limit spicy foods, NSAIDs.  He also presents with a bacterial sinus infection today.  Discussed that the new antibiotic may unfortunately make the abdominal symptoms worse, but we will proceed with Augmentin.  If he does develop worsening abdominal pain, nausea, vomiting, etc.-seek additional medical attention.  He verbalizes agreement.   Final Clinical Impressions(s) / UC Diagnoses   Final diagnoses:  Acute non-recurrent maxillary sinusitis  Gastroesophageal reflux disease without esophagitis     Discharge Instructions      -Start the antibiotic-Augmentin (amoxicillin-clavulanate), 1 pill every 12 hours for 7 days.  You can take this with food like with breakfast and dinner. -Stop omeprazole and start start lansoprazole daily -Limit acidic foods, spicy foods, ibuprofen/NSAIDs   ED Prescriptions     Medication Sig Dispense Auth. Provider   amoxicillin-clavulanate (AUGMENTIN) 875-125 MG tablet Take 1 tablet by mouth every 12 (twelve) hours. 14 tablet Hazel Sams, PA-C   lansoprazole (PREVACID) 30 MG capsule Take 1 capsule (30 mg total) by mouth daily at 12 noon. 30 capsule Hazel Sams, PA-C      PDMP not reviewed this encounter.   Hazel Sams, PA-C 03/24/22 1332

## 2022-04-19 ENCOUNTER — Other Ambulatory Visit: Payer: Self-pay | Admitting: Adult Health

## 2022-04-24 ENCOUNTER — Encounter: Payer: Self-pay | Admitting: Adult Health

## 2022-04-24 ENCOUNTER — Telehealth: Payer: 59 | Admitting: Physician Assistant

## 2022-04-24 DIAGNOSIS — H109 Unspecified conjunctivitis: Secondary | ICD-10-CM

## 2022-04-24 MED ORDER — OFLOXACIN 0.3 % OP SOLN: 1.0000 [drp] | Freq: Four times a day (QID) | OPHTHALMIC | 0 refills | Status: AC

## 2022-04-24 NOTE — Progress Notes (Signed)
E-Visit for [employer]   We are sorry that you are not feeling well.  Here is how we plan to help!  Based on what you have shared with me it looks like you have conjunctivitis.  Conjunctivitis is a common inflammatory or infectious condition of the eye that is often referred to as "pink eye".  In most cases it is contagious (viral or bacterial). However, not all conjunctivitis requires antibiotics (ex. Allergic).  We have made appropriate suggestions for you based upon your presentation.  I have prescribed Oflaxacin 1-2 drops 4 times a day times 5 days   Pink eye can be highly contagious.  It is typically spread through direct contact with secretions, or contaminated objects or surfaces that one may have touched.  Strict handwashing is suggested with soap and water is urged.  If not available, use alcohol based had sanitizer.  Avoid unnecessary touching of the eye.  If you wear contact lenses, you will need to refrain from wearing them until you see no white discharge from the eye for at least 24 hours after being on medication.  You should see symptom improvement in 1-2 days after starting the medication regimen.  Call us if symptoms are not improved in 1-2 days.  Home Care: Wash your hands often! Do not wear your contacts until you complete your treatment plan. Avoid sharing towels, bed linen, personal items with a person who has pink eye. See attention for anyone in your home with similar symptoms.  Get Help Right Away If: Your symptoms do not improve. You develop blurred or loss of vision. Your symptoms worsen (increased discharge, pain or redness)   Thank you for choosing an e-visit.  Your e-visit answers were reviewed by a board certified advanced clinical practitioner to complete your personal care plan. Depending upon the condition, your plan could have included both over the counter or prescription medications.  Please review your pharmacy choice. Make sure the pharmacy is open so  you can pick up prescription now. If there is a problem, you may contact your provider through [employer] and have the prescription routed to another pharmacy.  Your safety is important to [place]. If you have drug allergies check your prescription carefully.   For the next 24 hours you can use MyChart to ask questions about today's visit, request a non-urgent call back, or ask for a work or school excuse. You will get an email in the next two days asking about your experience. I hope that your e-visit has been valuable and will speed your recovery.  Approximately 5 minutes was spent documenting and reviewing patient's chart.

## 2022-06-08 ENCOUNTER — Other Ambulatory Visit: Payer: Self-pay | Admitting: Adult Health

## 2022-06-08 NOTE — Telephone Encounter (Signed)
Okay for refill?  

## 2022-06-17 ENCOUNTER — Other Ambulatory Visit: Payer: Self-pay | Admitting: Adult Health

## 2022-07-04 ENCOUNTER — Encounter (HOSPITAL_COMMUNITY): Payer: Self-pay

## 2022-07-04 ENCOUNTER — Ambulatory Visit (HOSPITAL_COMMUNITY)
Admission: RE | Admit: 2022-07-04 | Discharge: 2022-07-04 | Disposition: A | Discharge status: Home / Self Care | Payer: Commercial Managed Care - HMO | Source: Ambulatory Visit | Attending: Emergency Medicine | Admitting: Emergency Medicine

## 2022-07-04 VITALS — BP 138/91 | HR 85 | Temp 98.4°F | Resp 18

## 2022-07-04 DIAGNOSIS — J069 Acute upper respiratory infection, unspecified: Secondary | ICD-10-CM | POA: Insufficient documentation

## 2022-07-04 DIAGNOSIS — Z1152 Encounter for screening for COVID-19: Secondary | ICD-10-CM | POA: Diagnosis present

## 2022-07-04 NOTE — ED Triage Notes (Signed)
Pt reports a cough, nasal congestion and intermittent bilateral ear pain x 2 days. States he's having a back surgery on Monday and needs a covid test.

## 2022-07-04 NOTE — ED Provider Notes (Signed)
[place]    CSN: [id number] Arrival date & time: 07/04/22  1712     History   Chief Complaint Chief Complaint  Patient presents with   Nasal Congestion   Cough   Otalgia    HPI [name] is a 56 y.o. male.  Presents with 2 day history of sinus congestion, bilateral ear pressure, dry cough Needs a covid test, scheduled to have back surgery on Monday  Denies fever. No sore throat, chest pain, shortness of breath. No GI symptoms. Wife sick with URI this week  Has been taking mucinex   History of sinus surgery   Past Medical History:  Diagnosis Date   ALLERGIC RHINITIS    ANXIETY    ASTHMA    BACK PAIN    ERECTILE DYSFUNCTION    GERD    HYPERLIPIDEMIA    HYPERTENSION    LUMBAR STRAIN    NECK PAIN    PANCREATITIS, HX OF    PONV (postoperative nausea and vomiting)    nausea only    Seasonal allergies    SHOULDER PAIN, LEFT    Substance abuse ([place])    quit in 2007   TENDINITIS, PATELLAR     Patient Active Problem List   Diagnosis Date Noted   Prepatellar bursitis, right knee    Depression 04/16/2018   Degenerative joint disease of knee, 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   Elevated fasting blood sugar 10/06/2014   Reflux esophagitis 04/22/2012   HYPOGONADISM 09/19/2010   Hyperlipidemia 01/17/2010   ASTHMA 10/25/2008   ERECTILE DYSFUNCTION 10/08/2008   Essential hypertension 10/16/2007   Allergic rhinitis 05/13/2007    Past Surgical History:  Procedure Laterality Date   IMAGE GUIDED SINUS SURGERY N/A 04/13/2015   Procedure: IMAGE GUIDED SINUS SURGERY;  Surgeon: Carloyn Manner, MD;  Location: [place];  Service: ENT;  Laterality: N/A;  GAVE DISK TO CE CE   KNEE ARTHROSCOPY Right 2008   KNEE BURSECTOMY Right 11/24/2021   Procedure: right knee bursectomy;  Surgeon: Meredith Pel, MD;  Location: [place];  Service: Orthopedics;  Laterality: Right;   KNEE SURGERY Left    x2   MAXILLARY ANTROSTOMY Bilateral 04/13/2015   Procedure: MAXILLARY  ANTROSTOMY;  Surgeon: Carloyn Manner, MD;  Location: [place];  Service: ENT;  Laterality: Bilateral;       Home Medications    Prior to Admission medications   Medication Sig Start Date End Date Taking? Authorizing Provider  amoxicillin-clavulanate (AUGMENTIN) 875-125 MG tablet Take 1 tablet by mouth every 12 (twelve) hours. 03/24/22   Hazel Sams, PA-C  Artificial Tear Solution (SOOTHE XP) SOLN Place 1 drop into both eyes daily.    [provider]  buPROPion ER [facility] SR) 100 MG 12 hr tablet TAKE TWO TABLETS BY MOUTH EVERY MORNING 04/19/22   Nafziger, Tommi Rumps, NP  cyclobenzaprine (FLEXERIL) 10 MG tablet TAKE 1 TABLET BY MOUTH AT BEDTIME 06/19/22   Nafziger, Tommi Rumps, NP  fluticasone (FLONASE) 50 MCG/ACT nasal spray SPRAY 1 SPRAY INTO EACH NOSTRIL EVERY DAY 11/18/18   Nafziger, Tommi Rumps, NP  guaiFENesin (MUCINEX) 600 MG 12 hr tablet Take 600 mg by mouth 2 (two) times daily as needed (congestion).    [provider]  HYDROcodone bit-homatropine (HYCODAN) 5-1.5 MG/5ML syrup Take 5 mLs by mouth every 8 (eight) hours as needed for cough. 02/28/22   Nafziger, Tommi Rumps, NP  hydrocortisone (ANUSOL-HC) 2.5 % rectal cream Place 1 application. rectally 2 (two) times daily. 01/31/22   Dorothyann Peng, NP  lansoprazole (PREVACID) 30 MG capsule Take 1 capsule (30 mg total) by mouth daily at 12 noon. 03/24/22 04/23/22  Hazel Sams, PA-C  levothyroxine (SYNTHROID) 75 MCG tablet Take 1 tablet (75 mcg total) by mouth daily before breakfast. 10/17/21   Nafziger, Tommi Rumps, NP  losartan (COZAAR) 100 MG tablet TAKE ONE TABLET BY MOUTH DAILY 08/15/21   Nafziger, Tommi Rumps, NP  lovastatin (MEVACOR) 40 MG tablet Take 1 tablet (40 mg total) by mouth at bedtime. 10/17/21   Nafziger, Tommi Rumps, NP  meloxicam (MOBIC) 15 MG tablet TAKE ONE TABLET BY MOUTH DAILY AS NEEDED FOR PAIN 01/03/22   Dorothyann Peng, NP  Multiple Vitamin (MULTIVITAMIN WITH MINERALS) TABS tablet Take 1 tablet by mouth daily.    [provider]  Omega-3 Fatty Acids (FISH OIL) 1200 MG CAPS  09/10/21   [provider]  omeprazole (PRILOSEC) 20 MG capsule TAKE ONE CAPSULE BY MOUTH DAILY 10/23/21   Nafziger, Tommi Rumps, NP  predniSONE (DELTASONE) 10 MG tablet Take 1 tablet (10 mg total) by mouth 3 (three) times daily. 03/04/22   Nyoka Lint, PA-C  Probiotic Product (PROBIOTIC DAILY PO)  09/10/17   [provider]  sildenafil (VIAGRA) 100 MG tablet TAKE ONE TABLET BY MOUTH DAILY AS NEEDED FOR ERECTILE DYSFUNCTION 03/09/21   Nafziger, Tommi Rumps, NP  traMADol (ULTRAM) 50 MG tablet TAKE ONE TABLET BY MOUTH THREE TIMES A DAY 06/08/22   Nafziger, Tommi Rumps, NP  Turmeric (QC TUMERIC COMPLEX) 500 MG CAPS  09/10/17   [provider]    Family History Family History  Problem Relation Age of Onset   Lung disease Father    Melanoma Mother    Heart attack Brother    Colon polyps Neg Hx    Colon cancer Neg Hx    Stomach cancer Neg Hx    Pancreatic cancer Neg Hx    Esophageal cancer Neg Hx     Social History Social History   Tobacco Use   Smoking status: Never   Smokeless tobacco: Current    Types: Chew   Tobacco comments:    form given 11/24/15  Vaping Use   Vaping Use: Never used  Substance Use Topics   Alcohol use: No    Alcohol/week: 0.0 standard drinks of alcohol    Comment: hx alcohol abuse   Drug use: No     Allergies   Aspirin   Review of Systems Review of Systems  HENT:  Positive for ear pain.   Respiratory:  Positive for cough.    Per HPI  Physical Exam Triage Vital Signs ED Triage Vitals [07/04/22 1728]  Enc Vitals Group     BP (!) 138/91     Pulse Rate 85     Resp 18     Temp 98.4 F (36.9 C)     Temp Source Oral     SpO2 100 %     Weight      Height      Head Circumference      Peak Flow      Pain Score 0     Pain Loc      Pain Edu?      Excl. in [place]?    No data found.  Updated Vital Signs BP (!) 138/91 (BP Location: Left Arm)   Pulse 85   Temp 98.4 F (36.9 C) (Oral)    Resp 18   SpO2 100%    Physical Exam Vitals and nursing note reviewed.  Constitutional:  General: He is not in acute distress. HENT:     Right Ear: Tympanic membrane and ear canal normal.     Left Ear: Tympanic membrane and ear canal normal.     Nose: Congestion present. No rhinorrhea.     Mouth/Throat:     Mouth: Mucous membranes are moist.     Pharynx: Oropharynx is clear. No posterior oropharyngeal erythema.  Eyes:     Conjunctiva/sclera: Conjunctivae normal.  Cardiovascular:     Rate and Rhythm: Normal rate and regular rhythm.     Heart sounds: Normal heart sounds.  Pulmonary:     Effort: Pulmonary effort is normal.     Breath sounds: Normal breath sounds.  Lymphadenopathy:     Cervical: No cervical adenopathy.  Skin:    General: Skin is warm and dry.  Neurological:     Mental Status: He is alert and oriented to person, place, and time.     UC Treatments / Results  Labs (all labs ordered are listed, but only abnormal results are displayed) Labs Reviewed  SARS CORONAVIRUS 2 (TAT 6-24 HRS)    EKG   Radiology No results found.  Procedures Procedures (including critical care time)  Medications Ordered in UC Medications - No data to display  Initial Impression / Assessment and Plan / UC Course  I have reviewed the triage vital signs and the nursing notes.  Pertinent labs & imaging results that were available during my care of the patient were reviewed by me and considered in my medical decision making (see chart for details).  Well appearing Covid test pending - if positive, antivirals. GFR 94 Discussed symptomatic care Recommend continue mucinex 600 mg BID Nasal spray, allergy med, lots of fluids Return precautions discussed. Patient agrees to plan  Final Clinical Impressions(s) / UC Diagnoses   Final diagnoses:  Viral URI with cough  Encounter for screening for COVID-19     Discharge Instructions      We will call you if your covid test  returns positive. If so, we can send the antivirals to your pharmacy.  Continue symptomatic care and drink lots of fluids    ED Prescriptions   None    PDMP not reviewed this encounter.   Phenix Vandermeulen, Vernice Jefferson 07/04/22 1816

## 2022-07-04 NOTE — Discharge Instructions (Signed)
We will call you if your covid test returns positive. If so, we can send the antivirals to your pharmacy.  Continue symptomatic care and drink lots of fluids

## 2022-07-05 LAB — SARS CORONAVIRUS 2 (TAT 6-24 HRS): SARS Coronavirus 2: NEGATIVE

## 2022-07-17 ENCOUNTER — Ambulatory Visit: Payer: Self-pay | Admitting: Orthopaedic Surgery

## 2022-09-08 ENCOUNTER — Other Ambulatory Visit: Payer: Self-pay | Admitting: Adult Health

## 2022-09-08 DIAGNOSIS — I10 Essential (primary) hypertension: Secondary | ICD-10-CM

## 2022-09-20 ENCOUNTER — Telehealth (INDEPENDENT_AMBULATORY_CARE_PROVIDER_SITE_OTHER): Payer: Commercial Managed Care - HMO | Admitting: Internal Medicine

## 2022-09-20 ENCOUNTER — Encounter: Payer: Self-pay | Admitting: Internal Medicine

## 2022-09-20 VITALS — Wt 220.0 lb

## 2022-09-20 DIAGNOSIS — R051 Acute cough: Secondary | ICD-10-CM | POA: Diagnosis not present

## 2022-09-20 DIAGNOSIS — U071 COVID-19: Secondary | ICD-10-CM | POA: Diagnosis not present

## 2022-09-20 DIAGNOSIS — R0981 Nasal congestion: Secondary | ICD-10-CM | POA: Diagnosis not present

## 2022-09-20 DIAGNOSIS — R519 Headache, unspecified: Secondary | ICD-10-CM

## 2022-09-20 LAB — POC COVID19 BINAXNOW: SARS Coronavirus 2 Ag: POSITIVE — AB

## 2022-09-20 MED ORDER — MOLNUPIRAVIR EUA 200MG CAPSULE: 4.0000 | ORAL_CAPSULE | Freq: Two times a day (BID) | ORAL | 0 refills | Status: AC

## 2022-09-20 NOTE — Progress Notes (Signed)
Virtual Visit via Video Note  I connected with [place] on 09/20/22 at  3:30 PM EST by a video enabled telemedicine application and verified that I am speaking with the correct person using two identifiers.  Location patient: home Location provider: work office Persons participating in the virtual visit: patient, provider  I discussed the limitations of evaluation and management by telemedicine and the availability of in person appointments. The patient expressed understanding and agreed to proceed.   HPI: For the past 24 hours he has been having cough, headache, congestion, rhinorrhea and fatigue.  He came in early to the office and had a COVID test that was positive.  He has a history of hyperlipidemia, hypertension among other issues.   ROS: Constitutional: Positive for  chills, diaphoresis, appetite change and fatigue.  HEENT: Denies photophobia, eye pain, redness,  mouth sores, trouble swallowing, neck pain, neck stiffness and tinnitus.   Respiratory: Denies SOB, DOE, chest tightness,  and wheezing.   Cardiovascular: Denies chest pain, palpitations and leg swelling.  Gastrointestinal: Denies nausea, vomiting, abdominal pain, diarrhea, constipation, blood in stool and abdominal distention.  Genitourinary: Denies dysuria, urgency, frequency, hematuria, flank pain and difficulty urinating.  Endocrine: Denies: hot or cold intolerance, sweats, changes in hair or nails, polyuria, polydipsia. Musculoskeletal: Denies myalgias, back pain, joint swelling, arthralgias and gait problem.  Skin: Denies pallor, rash and wound.  Neurological: Denies dizziness, seizures, syncope, weakness, light-headedness, numbness and headaches.  Hematological: Denies adenopathy. Easy bruising, personal or family bleeding history  Psychiatric/Behavioral: Denies suicidal ideation, mood changes, confusion, nervousness, sleep disturbance and agitation   Past Medical History:  Diagnosis Date   ALLERGIC  RHINITIS    ANXIETY    ASTHMA    BACK PAIN    ERECTILE DYSFUNCTION    GERD    HYPERLIPIDEMIA    HYPERTENSION    LUMBAR STRAIN    NECK PAIN    PANCREATITIS, HX OF    PONV (postoperative nausea and vomiting)    nausea only    Seasonal allergies    SHOULDER PAIN, LEFT    Substance abuse ([place])    quit in 2007   TENDINITIS, PATELLAR     Past Surgical History:  Procedure Laterality Date   IMAGE GUIDED SINUS SURGERY N/A 04/13/2015   Procedure: IMAGE GUIDED SINUS SURGERY;  Surgeon: Carloyn Manner, MD;  Location: [facility];  Service: ENT;  Laterality: N/A;  GAVE DISK TO CE CE   KNEE ARTHROSCOPY Right 2008   KNEE BURSECTOMY Right 11/24/2021   Procedure: right knee bursectomy;  Surgeon: Meredith Pel, MD;  Location: [facility];  Service: Orthopedics;  Laterality: Right;   KNEE SURGERY Left    x2   MAXILLARY ANTROSTOMY Bilateral 04/13/2015   Procedure: MAXILLARY ANTROSTOMY;  Surgeon: Carloyn Manner, MD;  Location: [facility];  Service: ENT;  Laterality: Bilateral;    Family History  Problem Relation Age of Onset   Lung disease Father    Melanoma Mother    Heart attack Brother    Colon polyps Neg Hx    Colon cancer Neg Hx    Stomach cancer Neg Hx    Pancreatic cancer Neg Hx    Esophageal cancer Neg Hx     SOCIAL HX:   reports that he has never smoked. His smokeless tobacco use includes chew. He reports that he does not drink alcohol and does not use drugs.   Current Outpatient Medications:    Artificial Tear Solution (SOOTHE XP) SOLN,  Place 1 drop into both eyes daily., Disp: , Rfl:    buPROPion ER (WELLBUTRIN SR) 100 MG 12 hr tablet, TAKE TWO TABLETS BY MOUTH EVERY MORNING, Disp: 180 tablet, Rfl: 1   cyclobenzaprine (FLEXERIL) 10 MG tablet, TAKE 1 TABLET BY MOUTH AT BEDTIME, Disp: 90 tablet, Rfl: 0   fluticasone (FLONASE) 50 MCG/ACT nasal spray, SPRAY 1 SPRAY INTO EACH NOSTRIL EVERY DAY, Disp: 48 g, Rfl: 1   guaiFENesin (MUCINEX) 600 MG 12 hr tablet, Take  600 mg by mouth 2 (two) times daily as needed (congestion)., Disp: , Rfl:    levothyroxine (SYNTHROID) 75 MCG tablet, Take 1 tablet (75 mcg total) by mouth daily before breakfast., Disp: 90 tablet, Rfl: 3   losartan (COZAAR) 100 MG tablet, TAKE ONE TABLET BY MOUTH DAILY, Disp: 90 tablet, Rfl: 3   lovastatin (MEVACOR) 40 MG tablet, Take 1 tablet (40 mg total) by mouth at bedtime., Disp: 90 tablet, Rfl: 3   meloxicam (MOBIC) 15 MG tablet, TAKE ONE TABLET BY MOUTH DAILY AS NEEDED FOR PAIN, Disp: 90 tablet, Rfl: 1   molnupiravir EUA (LAGEVRIO) 200 mg CAPS capsule, Take 4 capsules (800 mg total) by mouth 2 (two) times daily for 5 days., Disp: 40 capsule, Rfl: 0   Multiple Vitamin (MULTIVITAMIN WITH MINERALS) TABS tablet, Take 1 tablet by mouth daily., Disp: , Rfl:    Omega-3 Fatty Acids (FISH OIL) 1200 MG CAPS, , Disp: , Rfl:    omeprazole (PRILOSEC) 20 MG capsule, TAKE ONE CAPSULE BY MOUTH DAILY, Disp: 90 capsule, Rfl: 3   Probiotic Product (PROBIOTIC DAILY PO), , Disp: , Rfl:    sildenafil (VIAGRA) 100 MG tablet, TAKE ONE TABLET BY MOUTH DAILY AS NEEDED FOR ERECTILE DYSFUNCTION, Disp: 15 tablet, Rfl: 3   traMADol (ULTRAM) 50 MG tablet, TAKE ONE TABLET BY MOUTH THREE TIMES A DAY, Disp: 90 tablet, Rfl: 2   Turmeric (QC TUMERIC COMPLEX) 500 MG CAPS, , Disp: , Rfl:    HYDROcodone bit-homatropine (HYCODAN) 5-1.5 MG/5ML syrup, Take 5 mLs by mouth every 8 (eight) hours as needed for cough. (Patient not taking: Reported on 09/20/2022), Disp: 120 mL, Rfl: 0   hydrocortisone (ANUSOL-HC) 2.5 % rectal cream, Place 1 application. rectally 2 (two) times daily. (Patient not taking: Reported on 09/20/2022), Disp: 30 g, Rfl: 3   lansoprazole (PREVACID) 30 MG capsule, Take 1 capsule (30 mg total) by mouth daily at 12 noon., Disp: 30 capsule, Rfl: 0  EXAM:   VITALS per patient if applicable: None reported  GENERAL: alert, oriented, appears well and in no acute distress, sounds congested  HEENT: atraumatic,  conjunttiva clear, no obvious abnormalities on inspection of external nose and ears  NECK: normal movements of the head and neck  LUNGS: on inspection no signs of respiratory distress, breathing rate appears normal, no obvious gross increased work of breathing, gasping or wheezing  CV: no obvious cyanosis  MS: moves all visible extremities without noticeable abnormality  PSYCH/NEURO: pleasant and cooperative, no obvious depression or anxiety, speech and thought processing grossly intact  ASSESSMENT AND PLAN:   COVID-19 - Plan: molnupiravir EUA (LAGEVRIO) 200 mg CAPS capsule  Nasal congestion - Plan: POC COVID-19 BinaxNow  Acute cough - Plan: POC COVID-19 BinaxNow  Acute nonintractable headache, unspecified headache type - Plan: POC COVID-19 BinaxNow  -Prescribed molnupiravir given his age and chronic conditions. -He may also use OTC medications such as antihistamines, decongestants, pain relievers, guaifenesin. -We have reviewed quarantine period of 5 days. -We have discussed symptoms that  would promote ED evaluation. -He knows to follow with [place] if symptoms fail to resolve.    I discussed the assessment and treatment plan with the patient. The patient was provided an opportunity to ask questions and all were answered. The patient agreed with the plan and demonstrated an understanding of the instructions.   The patient was advised to call back or seek an in-person evaluation if the symptoms worsen or if the condition fails to improve as anticipated.    Lelon Frohlich, MD  [facility] at [facility]

## 2022-09-24 ENCOUNTER — Encounter: Payer: Self-pay | Admitting: Internal Medicine

## 2022-09-25 ENCOUNTER — Other Ambulatory Visit: Payer: Self-pay

## 2022-09-25 DIAGNOSIS — N529 Male erectile dysfunction, unspecified: Secondary | ICD-10-CM

## 2022-09-25 MED ORDER — SILDENAFIL CITRATE 100 MG PO TABS: ORAL_TABLET | ORAL | 1 refills | Status: DC

## 2022-09-28 ENCOUNTER — Other Ambulatory Visit: Payer: Self-pay | Admitting: Adult Health

## 2022-10-02 NOTE — Telephone Encounter (Signed)
Patient need to schedule an ov for more refills. 

## 2022-10-30 ENCOUNTER — Encounter: Payer: Self-pay | Admitting: Adult Health

## 2022-10-30 MED ORDER — MELOXICAM 15 MG PO TABS: ORAL_TABLET | ORAL | 0 refills | Status: DC

## 2022-10-30 MED ORDER — TRAMADOL HCL 50 MG PO TABS: 50.0000 mg | ORAL_TABLET | Freq: Three times a day (TID) | ORAL | 0 refills | Status: DC

## 2022-10-31 NOTE — Telephone Encounter (Signed)
Rx refilled by provider

## 2022-11-15 ENCOUNTER — Encounter: Payer: Self-pay | Admitting: Radiology

## 2022-11-30 ENCOUNTER — Other Ambulatory Visit: Payer: Self-pay

## 2022-11-30 ENCOUNTER — Ambulatory Visit (INDEPENDENT_AMBULATORY_CARE_PROVIDER_SITE_OTHER): Payer: Commercial Managed Care - HMO | Admitting: Adult Health

## 2022-11-30 ENCOUNTER — Encounter: Payer: Self-pay | Admitting: Adult Health

## 2022-11-30 VITALS — BP 110/70 | HR 87 | Temp 98.2°F | Ht 74.0 in | Wt 219.0 lb

## 2022-11-30 DIAGNOSIS — E785 Hyperlipidemia, unspecified: Secondary | ICD-10-CM

## 2022-11-30 DIAGNOSIS — Z125 Encounter for screening for malignant neoplasm of prostate: Secondary | ICD-10-CM | POA: Diagnosis not present

## 2022-11-30 DIAGNOSIS — Z0000 Encounter for general adult medical examination without abnormal findings: Secondary | ICD-10-CM | POA: Diagnosis not present

## 2022-11-30 DIAGNOSIS — I10 Essential (primary) hypertension: Secondary | ICD-10-CM

## 2022-11-30 DIAGNOSIS — E039 Hypothyroidism, unspecified: Secondary | ICD-10-CM

## 2022-11-30 DIAGNOSIS — N529 Male erectile dysfunction, unspecified: Secondary | ICD-10-CM

## 2022-11-30 DIAGNOSIS — K2100 Gastro-esophageal reflux disease with esophagitis, without bleeding: Secondary | ICD-10-CM

## 2022-11-30 DIAGNOSIS — M170 Bilateral primary osteoarthritis of knee: Secondary | ICD-10-CM

## 2022-11-30 DIAGNOSIS — F329 Major depressive disorder, single episode, unspecified: Secondary | ICD-10-CM

## 2022-11-30 LAB — COMPREHENSIVE METABOLIC PANEL
ALT: 42 U/L (ref 0–53)
AST: 28 U/L (ref 0–37)
Albumin: 4.8 g/dL (ref 3.5–5.2)
Alkaline Phosphatase: 61 U/L (ref 39–117)
BUN: 22 mg/dL (ref 6–23)
CO2: 27 mEq/L (ref 19–32)
Calcium: 9.5 mg/dL (ref 8.4–10.5)
Chloride: 102 mEq/L (ref 96–112)
Creatinine, Ser: 0.9 mg/dL (ref 0.40–1.50)
GFR: 95.35 mL/min (ref >60.00)
Glucose, Bld: 135 mg/dL — ABNORMAL HIGH (ref 70–99)
Potassium: 4.7 mEq/L (ref 3.5–5.1)
Sodium: 140 mEq/L (ref 135–145)
Total Bilirubin: 0.7 mg/dL (ref 0.2–1.2)
Total Protein: 6.8 g/dL (ref 6.0–8.3)

## 2022-11-30 LAB — LIPID PANEL
Cholesterol: 162 mg/dL (ref 0–200)
HDL: 39.9 mg/dL (ref >39.00)
LDL Cholesterol: 105 mg/dL — ABNORMAL HIGH (ref 0–99)
NonHDL: 121.62
Total CHOL/HDL Ratio: 4
Triglycerides: 84 mg/dL (ref 0.0–149.0)
VLDL: 16.8 mg/dL (ref 0.0–40.0)

## 2022-11-30 LAB — CBC
HCT: 48.5 % (ref 39.0–52.0)
Hemoglobin: 16.7 g/dL (ref 13.0–17.0)
MCHC: 34.5 g/dL (ref 30.0–36.0)
MCV: 85.5 fl (ref 78.0–100.0)
Platelets: 206 10*3/uL (ref 150.0–400.0)
RBC: 5.67 Mil/uL (ref 4.22–5.81)
RDW: 12.7 % (ref 11.5–15.5)
WBC: 5.5 10*3/uL (ref 4.0–10.5)

## 2022-11-30 LAB — HEMOGLOBIN A1C: Hgb A1c MFr Bld: 6.2 % (ref 4.6–6.5)

## 2022-11-30 LAB — TSH: TSH: 3.28 u[IU]/mL (ref 0.35–5.50)

## 2022-11-30 LAB — PSA: PSA: 1.84 ng/mL (ref 0.10–4.00)

## 2022-11-30 MED ORDER — METFORMIN HCL 500 MG PO TABS: 500.0000 mg | ORAL_TABLET | Freq: Every day | ORAL | 0 refills | Status: DC

## 2022-11-30 NOTE — Progress Notes (Signed)
Subjective:    Patient ID: [name], male    DOB: 05-21-1966, 57 y.o.   MRN: [id number]  HPI Patient presents for yearly preventative medicine examination. He is a pleasant 57 year old male who  has a past medical history of ALLERGIC RHINITIS, ANXIETY, ASTHMA, BACK PAIN, ERECTILE DYSFUNCTION, GERD, HYPERLIPIDEMIA, HYPERTENSION, LUMBAR STRAIN, NECK PAIN, PANCREATITIS, HX OF, PONV (postoperative nausea and vomiting), Seasonal allergies, SHOULDER PAIN, LEFT, Substance abuse ([place]), and TENDINITIS, PATELLAR.  Hypertension-takes Cozaar 100 mg QHS.  He denies dizziness, lightheadedness, chest pain, shortness of breath.  Does not check his blood pressure at home. Denies dizziness, lightheadedness, blurred vision, or headaches.  BP Readings from Last 3 Encounters:  11/30/22 110/70  07/04/22 (!) 138/91  03/24/22 125/83   Hyperlipidemia -prescribed lovastatin 40 mg daily.  He denies myalgia or fatigue Lab Results  Component Value Date   CHOL 151 10/17/2021   HDL 43.30 10/17/2021   LDLCALC 95 10/17/2021   LDLDIRECT 147.2 07/24/2012   TRIG 62.0 10/17/2021   CHOLHDL 3 10/17/2021   GERD - takes prilosec 20 mg daily. Feels well controlled  Depression -well-controlled with Wellbutrin 200 mg daily.  Chronic knee pain due to osteoarthritis-pain controlled with tramadol 50 mg 3 times daily as needed and Mobic 15mg . He had back surgery and knee surgery over the last year. He will work on reducing Tramadol   ED-uses Viagra as needed  Hypothyroidism - well controlled on Synthroid 75 mcg daily   All immunizations and health maintenance protocols were reviewed with the patient and needed orders were placed.  Appropriate screening laboratory values were ordered for the patient including screening of hyperlipidemia, renal function and hepatic function.   Medication reconciliation,  past medical history, social history, problem list and allergies were reviewed in detail with the patient  Goals  were established with regard to weight loss, exercise, and  diet in compliance with medications Wt Readings from Last 3 Encounters:  11/30/22 219 lb (99.3 kg)  09/20/22 220 lb (99.8 kg)  02/28/22 210 lb (95.3 kg)   He is up to date on routine colon cancer screening   Review of Systems  Constitutional: Negative.   HENT: Negative.    Eyes: Negative.   Respiratory: Negative.    Cardiovascular: Negative.   Gastrointestinal: Negative.   Endocrine: Negative.   Genitourinary: Negative.   Musculoskeletal:  Positive for arthralgias and back pain.  Skin: Negative.   Allergic/Immunologic: Negative.   Neurological: Negative.   Hematological: Negative.   Psychiatric/Behavioral: Negative.    All other systems reviewed and are negative.  Past Medical History:  Diagnosis Date   ALLERGIC RHINITIS    ANXIETY    ASTHMA    BACK PAIN    ERECTILE DYSFUNCTION    GERD    HYPERLIPIDEMIA    HYPERTENSION    LUMBAR STRAIN    NECK PAIN    PANCREATITIS, HX OF    PONV (postoperative nausea and vomiting)    nausea only    Seasonal allergies    SHOULDER PAIN, LEFT    Substance abuse ([place])    quit in 2007   TENDINITIS, PATELLAR     Social History   Socioeconomic History   Marital status: Married    Spouse name: Not on file   Number of children: Not on file   Years of education: Not on file   Highest education level: Not on file  Occupational History   Not on file  Tobacco Use   Smoking  status: Never   Smokeless tobacco: Current    Types: Chew   Tobacco comments:    form given 11/24/15  Vaping Use   Vaping Use: Never used  Substance and Sexual Activity   Alcohol use: No    Alcohol/week: 0.0 standard drinks of alcohol    Comment: hx alcohol abuse   Drug use: No   Sexual activity: Not on file  Other Topics Concern   Not on file  Social History Narrative   Works as a Games developer   Married to [place] Determinants of Health   Financial Resource Strain: Not on  file  Food Insecurity: Not on file  Transportation Needs: Not on file  Physical Activity: Not on file  Stress: Not on file  Social Connections: Not on file  Intimate Partner Violence: Not on file    Past Surgical History:  Procedure Laterality Date   IMAGE GUIDED SINUS SURGERY N/A 04/13/2015   Procedure: [place];  Surgeon: Carloyn Manner, MD;  Location: [facility];  Service: ENT;  Laterality: N/A;  GAVE DISK TO CE CE   KNEE ARTHROSCOPY Right 2008   KNEE BURSECTOMY Right 11/24/2021   Procedure: right knee bursectomy;  Surgeon: Meredith Pel, MD;  Location: [facility];  Service: Orthopedics;  Laterality: Right;   KNEE SURGERY Left    x2   MAXILLARY ANTROSTOMY Bilateral 04/13/2015   Procedure: MAXILLARY ANTROSTOMY;  Surgeon: Carloyn Manner, MD;  Location: [facility];  Service: ENT;  Laterality: Bilateral;    Family History  Problem Relation Age of Onset   Lung disease Father    Melanoma Mother    Heart attack Brother    Colon polyps Neg Hx    Colon cancer Neg Hx    Stomach cancer Neg Hx    Pancreatic cancer Neg Hx    Esophageal cancer Neg Hx     Allergies  Allergen Reactions   Aspirin Nausea And Vomiting    Current Outpatient Medications on File Prior to Visit  Medication Sig Dispense Refill   Artificial Tear Solution (SOOTHE XP) SOLN Place 1 drop into both eyes daily.     buPROPion ER (WELLBUTRIN SR) 100 MG 12 hr tablet TAKE 2 TABLETS BY MOUTH EVERY MORNING 180 tablet 0   cyclobenzaprine (FLEXERIL) 10 MG tablet TAKE 1 TABLET BY MOUTH AT BEDTIME 90 tablet 0   fluticasone (FLONASE) 50 MCG/ACT nasal spray SPRAY 1 SPRAY INTO EACH NOSTRIL EVERY DAY 48 g 1   gabapentin (NEURONTIN) 300 MG capsule TAKE 1 CAPSULE BY MOUTH THREE TIMES A DAY AS NEEDED     guaiFENesin (MUCINEX) 600 MG 12 hr tablet Take 600 mg by mouth 2 (two) times daily as needed (congestion).     HYDROcodone bit-homatropine (HYCODAN) 5-1.5 MG/5ML syrup Take 5 mLs by mouth every  8 (eight) hours as needed for cough. 120 mL 0   hydrocortisone (ANUSOL-HC) 2.5 % rectal cream Place 1 application. rectally 2 (two) times daily. 30 g 3   levothyroxine (SYNTHROID) 75 MCG tablet Take 1 tablet (75 mcg total) by mouth daily before breakfast. 90 tablet 3   losartan (COZAAR) 100 MG tablet TAKE ONE TABLET BY MOUTH DAILY 90 tablet 3   lovastatin (MEVACOR) 40 MG tablet Take 1 tablet (40 mg total) by mouth at bedtime. 90 tablet 3   meloxicam (MOBIC) 15 MG tablet TAKE ONE TABLET BY MOUTH DAILY AS NEEDED FOR PAIN 90 tablet 0   Multiple Vitamin (  MULTIVITAMIN WITH MINERALS) TABS tablet Take 1 tablet by mouth daily.     Omega-3 Fatty Acids (FISH OIL) 1200 MG CAPS      omeprazole (PRILOSEC) 20 MG capsule TAKE ONE CAPSULE BY MOUTH DAILY 90 capsule 3   Probiotic Product (PROBIOTIC DAILY PO)      sildenafil (VIAGRA) 100 MG tablet TAKE ONE TABLET BY MOUTH DAILY AS NEEDED FOR ERECTILE DYSFUNCTION 15 tablet 1   traMADol (ULTRAM) 50 MG tablet Take 1 tablet (50 mg total) by mouth 3 (three) times daily. 90 tablet 0   Turmeric (QC TUMERIC COMPLEX) 500 MG CAPS      lansoprazole (PREVACID) 30 MG capsule Take 1 capsule (30 mg total) by mouth daily at 12 noon. 30 capsule 0   No current facility-administered medications on file prior to visit.    BP 110/70   Pulse 87   Temp 98.2 F (36.8 C) (Oral)   Ht 6\' 2"  (1.88 m)   Wt 219 lb (99.3 kg)   SpO2 98%   BMI 28.12 kg/m       Objective:   Physical Exam Vitals and nursing note reviewed.  Constitutional:      General: He is not in acute distress.    Appearance: Normal appearance. He is not ill-appearing.  HENT:     Head: Normocephalic and atraumatic.     Right Ear: Tympanic membrane, ear canal and external ear normal. There is no impacted cerumen.     Left Ear: Tympanic membrane, ear canal and external ear normal. There is no impacted cerumen.     Nose: Nose normal. No congestion or rhinorrhea.     Mouth/Throat:     Mouth: Mucous membranes are  moist.     Pharynx: Oropharynx is clear.  Eyes:     Extraocular Movements: Extraocular movements intact.     Conjunctiva/sclera: Conjunctivae normal.     Pupils: Pupils are equal, round, and reactive to light.  Neck:     Vascular: No carotid bruit.  Cardiovascular:     Rate and Rhythm: Normal rate and regular rhythm.     Pulses: Normal pulses.     Heart sounds: No murmur heard.    No friction rub. No gallop.  Pulmonary:     Effort: Pulmonary effort is normal.     Breath sounds: Normal breath sounds.  Abdominal:     General: Abdomen is flat. Bowel sounds are normal. There is no distension.     Palpations: Abdomen is soft. There is no mass.     Tenderness: There is no abdominal tenderness. There is no guarding or rebound.     Hernia: No hernia is present.  Musculoskeletal:        General: Normal range of motion.     Cervical back: Normal range of motion and neck supple.  Lymphadenopathy:     Cervical: No cervical adenopathy.  Skin:    General: Skin is warm and dry.     Capillary Refill: Capillary refill takes less than 2 seconds.  Neurological:     General: No focal deficit present.     Mental Status: He is alert and oriented to person, place, and time.  Psychiatric:        Mood and Affect: Mood normal.        Behavior: Behavior normal.        Thought Content: Thought content normal.        Judgment: Judgment normal.        Assessment & Plan:  1. Routine general medical examination at a health care facility Today patient counseled on age appropriate routine health concerns for screening and prevention, each reviewed and up to date or declined. Immunizations reviewed and up to date or declined. Labs ordered and reviewed. Risk factors for depression reviewed and negative. Hearing function and visual acuity are intact. ADLs screened and addressed as needed. Functional ability and level of safety reviewed and appropriate. Education, counseling and referrals performed based on  assessed risks today. Patient provided with a copy of personalized plan for preventive services.   2. Essential hypertension - well controlled. No change in medication  - Lipid panel; Future - TSH; Future - CBC; Future - Comprehensive metabolic panel; Future - Hemoglobin A1c; Future  3. Hyperlipidemia, unspecified hyperlipidemia type - Consider increase in statin  - Lipid panel; Future - TSH; Future - CBC; Future - Comprehensive metabolic panel; Future - Hemoglobin A1c; Future  4. Reactive depression - Continue wellbutrin  - Lipid panel; Future - TSH; Future - CBC; Future - Comprehensive metabolic panel; Future - Hemoglobin A1c; Future  5. Prostate cancer screening  - PSA; Future  6. Gastroesophageal reflux disease with esophagitis without hemorrhage - Continue PPI  - Lipid panel; Future - TSH; Future - CBC; Future - Comprehensive metabolic panel; Future - Hemoglobin A1c; Future  7. Erectile dysfunction, unspecified erectile dysfunction type - Continue Viagra PRN  - Lipid panel; Future - TSH; Future - CBC; Future - Comprehensive metabolic panel; Future - Hemoglobin A1c; Future  8. Acquired hypothyroidism  - Lipid panel; Future - TSH; Future - CBC; Future - Comprehensive metabolic panel; Future - Hemoglobin A1c; Future  9. Primary osteoarthritis of both knees - Will work on reducing Tramadol. Continue with Mobic    Dorothyann Peng, NP

## 2022-12-06 ENCOUNTER — Other Ambulatory Visit: Payer: Self-pay | Admitting: Adult Health

## 2022-12-06 DIAGNOSIS — R1013 Epigastric pain: Secondary | ICD-10-CM

## 2023-01-02 ENCOUNTER — Other Ambulatory Visit: Payer: Self-pay | Admitting: Adult Health

## 2023-01-14 ENCOUNTER — Other Ambulatory Visit: Payer: Self-pay | Admitting: Adult Health

## 2023-02-03 ENCOUNTER — Other Ambulatory Visit: Payer: Self-pay | Admitting: Adult Health

## 2023-02-24 ENCOUNTER — Other Ambulatory Visit: Payer: Self-pay | Admitting: Adult Health

## 2023-02-26 ENCOUNTER — Encounter: Payer: Self-pay | Admitting: Adult Health

## 2023-02-26 ENCOUNTER — Ambulatory Visit (INDEPENDENT_AMBULATORY_CARE_PROVIDER_SITE_OTHER): Payer: Commercial Managed Care - HMO | Admitting: Adult Health

## 2023-02-26 VITALS — BP 120/70 | HR 90 | Temp 98.6°F | Ht 74.0 in | Wt 214.0 lb

## 2023-02-26 DIAGNOSIS — R7303 Prediabetes: Secondary | ICD-10-CM

## 2023-02-26 DIAGNOSIS — I10 Essential (primary) hypertension: Secondary | ICD-10-CM

## 2023-02-26 LAB — POCT GLYCOSYLATED HEMOGLOBIN (HGB A1C): Hemoglobin A1C: 5.8 % — AB (ref 4.0–5.6)

## 2023-02-26 MED ORDER — METFORMIN HCL 500 MG PO TABS: 500.0000 mg | ORAL_TABLET | Freq: Every day | ORAL | 2 refills | Status: DC

## 2023-02-26 NOTE — Progress Notes (Signed)
Subjective:    Patient ID: [name], male    DOB: 02/04/1966, 57 y.o.   MRN: [id number]  HPI 57 year old male who  has a past medical history of ALLERGIC RHINITIS, ANXIETY, ASTHMA, BACK PAIN, ERECTILE DYSFUNCTION, GERD, HYPERLIPIDEMIA, HYPERTENSION, LUMBAR STRAIN, NECK PAIN, PANCREATITIS, HX OF, PONV (postoperative nausea and vomiting), Seasonal allergies, SHOULDER PAIN, LEFT, Substance abuse (HCC), and TENDINITIS, PATELLAR.  He presents to the office today for 12-month follow-up regarding prediabetes and HTN .  During his physical exam 3 months ago his A1c had increased to 6.2.  I had him start taking metformin 500 mg daily and advised to make lifestyle modifications such as cutting back on sugars and carbs.   For blood pressure control he takes Cozaar 100 mg nightly.  He denies dizziness, lightheadedness, chest pain, shortness of breath.  He does not check his pressure at home. BP Readings from Last 3 Encounters:  02/26/23 120/70  11/30/22 110/70  07/04/22 (!) 138/91   Wt Readings from Last 3 Encounters:  02/26/23 214 lb (97.1 kg)  11/30/22 219 lb (99.3 kg)  09/20/22 220 lb (99.8 kg)     Review of Systems See HPI   Past Medical History:  Diagnosis Date   ALLERGIC RHINITIS    ANXIETY    ASTHMA    BACK PAIN    ERECTILE DYSFUNCTION    GERD    HYPERLIPIDEMIA    HYPERTENSION    LUMBAR STRAIN    NECK PAIN    PANCREATITIS, HX OF    PONV (postoperative nausea and vomiting)    nausea only    Seasonal allergies    SHOULDER PAIN, LEFT    Substance abuse (HCC)    quit in 2007   TENDINITIS, PATELLAR     Social History   Socioeconomic History   Marital status: Married    Spouse name: Not on file   Number of children: Not on file   Years of education: Not on file   Highest education level: Not on file  Occupational History   Not on file  Tobacco Use   Smoking status: Never   Smokeless tobacco: Current    Types: Chew   Tobacco comments:    form given 11/24/15   Vaping Use   Vaping Use: Never used  Substance and Sexual Activity   Alcohol use: No    Alcohol/week: 0.0 standard drinks of alcohol    Comment: hx alcohol abuse   Drug use: No   Sexual activity: Not on file  Other Topics Concern   Not on file  Social History Narrative   Works as a Music therapist   Married to [employer]       Social Determinants of Health   Financial Resource Strain: Not on file  Food Insecurity: Not on file  Transportation Needs: Not on file  Physical Activity: Not on file  Stress: Not on file  Social Connections: Not on file  Intimate Partner Violence: Not on file    Past Surgical History:  Procedure Laterality Date   IMAGE GUIDED SINUS SURGERY N/A 04/13/2015   Procedure: IMAGE GUIDED SINUS SURGERY;  Surgeon: Bud Face, MD;  Location: [facility];  Service: ENT;  Laterality: N/A;  GAVE DISK TO CE CE   KNEE ARTHROSCOPY Right 2008   KNEE BURSECTOMY Right 11/24/2021   Procedure: right knee bursectomy;  Surgeon: Cammy Copa, MD;  Location: [facility] OR;  Service: Orthopedics;  Laterality: Right;   KNEE SURGERY Left  x2   MAXILLARY ANTROSTOMY Bilateral 04/13/2015   Procedure: MAXILLARY ANTROSTOMY;  Surgeon: Bud Face, MD;  Location: [facility];  Service: ENT;  Laterality: Bilateral;    Family History  Problem Relation Age of Onset   Lung disease Father    Melanoma Mother    Heart attack Brother    Colon polyps Neg Hx    Colon cancer Neg Hx    Stomach cancer Neg Hx    Pancreatic cancer Neg Hx    Esophageal cancer Neg Hx     Allergies  Allergen Reactions   Aspirin Nausea And Vomiting    Current Outpatient Medications on File Prior to Visit  Medication Sig Dispense Refill   Artificial Tear Solution (SOOTHE XP) SOLN Place 1 drop into both eyes daily.     buPROPion ER (WELLBUTRIN SR) 100 MG 12 hr tablet TAKE 2 TABLETS BY MOUTH EVERY MORNING 180 tablet 0   cyclobenzaprine (FLEXERIL) 10 MG tablet TAKE 1 TABLET BY MOUTH AT  BEDTIME 90 tablet 0   fluticasone (FLONASE) 50 MCG/ACT nasal spray SPRAY 1 SPRAY INTO EACH NOSTRIL EVERY DAY 48 g 1   gabapentin (NEURONTIN) 300 MG capsule TAKE 1 CAPSULE BY MOUTH THREE TIMES A DAY AS NEEDED     guaiFENesin (MUCINEX) 600 MG 12 hr tablet Take 600 mg by mouth 2 (two) times daily as needed (congestion).     hydrocortisone (ANUSOL-HC) 2.5 % rectal cream Place 1 application. rectally 2 (two) times daily. 30 g 3   lansoprazole (PREVACID) 30 MG capsule Take 1 capsule (30 mg total) by mouth daily at 12 noon. 30 capsule 0   levothyroxine (SYNTHROID) 75 MCG tablet Take 1 tablet (75 mcg total) by mouth daily before breakfast. 90 tablet 3   losartan (COZAAR) 100 MG tablet TAKE ONE TABLET BY MOUTH DAILY 90 tablet 3   lovastatin (MEVACOR) 40 MG tablet TAKE ONE TABLET BY MOUTH AT BEDTIME 90 tablet 3   meloxicam (MOBIC) 15 MG tablet TAKE 1 TABLET BY MOUTH DAILY AS NEEDED FOR PAIN 90 tablet 0   Multiple Vitamin (MULTIVITAMIN WITH MINERALS) TABS tablet Take 1 tablet by mouth daily.     Omega-3 Fatty Acids (FISH OIL) 1200 MG CAPS      omeprazole (PRILOSEC) 20 MG capsule TAKE ONE CAPSULE BY MOUTH DAILY 90 capsule 3   Probiotic Product (PROBIOTIC DAILY PO)      sildenafil (VIAGRA) 100 MG tablet TAKE ONE TABLET BY MOUTH DAILY AS NEEDED FOR ERECTILE DYSFUNCTION 15 tablet 1   traMADol (ULTRAM) 50 MG tablet Take 1 tablet (50 mg total) by mouth 3 (three) times daily. 90 tablet 0   Turmeric (QC TUMERIC COMPLEX) 500 MG CAPS      No current facility-administered medications on file prior to visit.    BP 120/70   Pulse 90   Temp 98.6 F (37 C)   Wt 214 lb (97.1 kg)   SpO2 100%   BMI 27.48 kg/m       Objective:   Physical Exam Vitals and nursing note reviewed.  Constitutional:      Appearance: Normal appearance. He is obese.  Cardiovascular:     Rate and Rhythm: Normal rate and regular rhythm.     Pulses: Normal pulses.     Heart sounds: Normal heart sounds.  Pulmonary:     Effort:  Pulmonary effort is normal.     Breath sounds: Normal breath sounds.  Skin:    General: Skin is warm and dry.  Neurological:  General: No focal deficit present.     Mental Status: He is alert and oriented to person, place, and time.  Psychiatric:        Mood and Affect: Mood normal.        Behavior: Behavior normal.        Thought Content: Thought content normal.        Judgment: Judgment normal.       Assessment & Plan:  1. Essential hypertension - well controlled. - No change in medication   2. Prediabetes  - POC HgB A1c- 5.8 - has improved  - Will keep him on Metformin until we see him next for his physical in 9 months   Shirline Frees, NP

## 2023-02-26 NOTE — Patient Instructions (Signed)
There are no preventive care reminders to display for this patient.    Row Labels 11/30/2022    7:08 AM 09/20/2022    3:04 PM 10/17/2021    7:03 AM  Depression screen PHQ 2/9   Section Header. No data exists in this row.     Decreased Interest   0 0 0  Down, Depressed, Hopeless   0 0 0  PHQ - 2 Score   0 0 0  Altered sleeping   0 0 1  Tired, decreased energy   0 0 1  Change in appetite   0 0 0  Feeling bad or failure about yourself    0 0 0  Trouble concentrating   0 0 0  Moving slowly or fidgety/restless   0 0 0  Suicidal thoughts   0 0 0  PHQ-9 Score   0 0 2  Difficult doing work/chores   Not difficult at all Not difficult at all Not difficult at all

## 2023-03-06 ENCOUNTER — Other Ambulatory Visit: Payer: Self-pay | Admitting: Adult Health

## 2023-03-06 MED ORDER — HYDROCORTISONE (PERIANAL) 2.5 % EX CREA: 1.0000 | TOPICAL_CREAM | Freq: Two times a day (BID) | CUTANEOUS | 3 refills | Status: DC

## 2023-03-30 ENCOUNTER — Other Ambulatory Visit: Payer: Self-pay | Admitting: Adult Health

## 2023-04-19 ENCOUNTER — Other Ambulatory Visit: Payer: Self-pay | Admitting: Adult Health

## 2023-05-14 ENCOUNTER — Other Ambulatory Visit: Payer: Self-pay | Admitting: Adult Health

## 2023-06-07 ENCOUNTER — Encounter: Payer: Self-pay | Admitting: Family Medicine

## 2023-06-07 ENCOUNTER — Ambulatory Visit (INDEPENDENT_AMBULATORY_CARE_PROVIDER_SITE_OTHER): Payer: Managed Care, Other (non HMO) | Admitting: Family Medicine

## 2023-06-07 VITALS — BP 124/74 | HR 78 | Temp 98.1°F | Wt 214.0 lb

## 2023-06-07 DIAGNOSIS — B356 Tinea cruris: Secondary | ICD-10-CM

## 2023-06-07 DIAGNOSIS — S29011A Strain of muscle and tendon of front wall of thorax, initial encounter: Secondary | ICD-10-CM | POA: Diagnosis not present

## 2023-06-07 MED ORDER — KETOCONAZOLE 2 % EX CREA: 1.0000 | TOPICAL_CREAM | Freq: Two times a day (BID) | CUTANEOUS | 2 refills | Status: DC | PRN

## 2023-06-07 NOTE — Progress Notes (Signed)
+    Subjective:    Patient ID: [name], male    DOB: 05-31-1966, 57 y.o.   MRN: [id number]  HPI Here for 2 issues. First one week ago while he was lifting a load of bricks up in the air he felt as sudden sharp pain in the left chest area. He applied ice and took Ibuprofen, and it slowly felt better over the next few days. Then yesterday as he was pushing a miter saw assembly onto his truck, the pain flared up again. No SOB. Also he has had recurring rashes between the buttocks for the past few months. They itch and burn.    Review of Systems  Constitutional: Negative.   Respiratory: Negative.    Cardiovascular:  Positive for chest pain. Negative for palpitations and leg swelling.  Skin:  Positive for rash.       Objective:   Physical Exam Constitutional:      Appearance: Normal appearance. He is not ill-appearing.  Cardiovascular:     Rate and Rhythm: Normal rate and regular rhythm.     Pulses: Normal pulses.     Heart sounds: Normal heart sounds.  Pulmonary:     Effort: Pulmonary effort is normal.     Breath sounds: Normal breath sounds.     Comments: He is tender in a narrow band from the left side of the sternum to the axillary line. No crepitus  Skin:    Comments: There are areas of macular erythema between the buttocks in the coccygeal area   Neurological:     Mental Status: He is alert.           Assessment & Plan:  He either has a strained intercostal muscle or a rib separation. He can avoid heavy lifting for a few weeks and apply ice. Since he already takes Meloxicam daily I advised him to take Tylenol rather than Ibuprofen. He also has tinea cruris and we will treat this with Ketoconazole cream.  Gershon Crane, MD

## 2023-06-12 ENCOUNTER — Other Ambulatory Visit: Payer: Self-pay | Admitting: Adult Health

## 2023-06-12 DIAGNOSIS — N529 Male erectile dysfunction, unspecified: Secondary | ICD-10-CM

## 2023-06-27 ENCOUNTER — Ambulatory Visit: Payer: Commercial Managed Care - HMO | Admitting: Adult Health

## 2023-06-27 ENCOUNTER — Encounter: Payer: Self-pay | Admitting: Adult Health

## 2023-06-27 VITALS — BP 130/88 | HR 69 | Temp 98.1°F | Ht 74.0 in | Wt 219.0 lb

## 2023-06-27 DIAGNOSIS — B356 Tinea cruris: Secondary | ICD-10-CM | POA: Diagnosis not present

## 2023-06-27 MED ORDER — TERBINAFINE HCL 250 MG PO TABS
250.0000 mg | ORAL_TABLET | Freq: Every day | ORAL | 0 refills | Status: AC
Start: 2023-06-27 — End: 2023-07-18

## 2023-06-27 NOTE — Patient Instructions (Addendum)
I am going to prescribe you Lamisil oral medication. Take this daily for three weeks.   Stop your cholesterol medication, Lovastatin while taking this

## 2023-06-27 NOTE — Progress Notes (Signed)
Subjective:    Patient ID: [name], male    DOB: May 17, 1966, 57 y.o.   MRN: [id number]  HPI 57 year old male who  has a past medical history of ALLERGIC RHINITIS, ANXIETY, ASTHMA, BACK PAIN, ERECTILE DYSFUNCTION, GERD, HYPERLIPIDEMIA, HYPERTENSION, LUMBAR STRAIN, NECK PAIN, PANCREATITIS, HX OF, PONV (postoperative nausea and vomiting), Seasonal allergies, SHOULDER PAIN, LEFT, Substance abuse (HCC), and TENDINITIS, PATELLAR.  He presents to the office today for the complaint of a " rash". He reports that for the last year he has had an on again and off again rash on  between his buttocks, hips, and into his groin. He was seen by another provider in the office about 3 weeks ago and prescribed Ketoconazole cream. This has not made much improvement. This rash burns and itches, is red and flat. He has not noticed any drainage   Review of Systems See HPI   Past Medical History:  Diagnosis Date   ALLERGIC RHINITIS    ANXIETY    ASTHMA    BACK PAIN    ERECTILE DYSFUNCTION    GERD    HYPERLIPIDEMIA    HYPERTENSION    LUMBAR STRAIN    NECK PAIN    PANCREATITIS, HX OF    PONV (postoperative nausea and vomiting)    nausea only    Seasonal allergies    SHOULDER PAIN, LEFT    Substance abuse (HCC)    quit in 2007   TENDINITIS, PATELLAR     Social History   Socioeconomic History   Marital status: Married    Spouse name: Not on file   Number of children: Not on file   Years of education: Not on file   Highest education level: Not on file  Occupational History   Not on file  Tobacco Use   Smoking status: Never   Smokeless tobacco: Current    Types: Chew   Tobacco comments:    form given 11/24/15  Vaping Use   Vaping status: Never Used  Substance and Sexual Activity   Alcohol use: No    Alcohol/week: 0.0 standard drinks of alcohol    Comment: hx alcohol abuse   Drug use: No   Sexual activity: Not on file  Other Topics Concern   Not on file  Social History Narrative    Works as a Music therapist   Married to [employer]       Social Determinants of Health   Financial Resource Strain: Not on file  Food Insecurity: Not on file  Transportation Needs: Not on file  Physical Activity: Not on file  Stress: Not on file  Social Connections: Not on file  Intimate Partner Violence: Not on file    Past Surgical History:  Procedure Laterality Date   IMAGE GUIDED SINUS SURGERY N/A 04/13/2015   Procedure: IMAGE GUIDED SINUS SURGERY;  Surgeon: Bud Face, MD;  Location: [facility];  Service: ENT;  Laterality: N/A;  GAVE DISK TO CE CE   KNEE ARTHROSCOPY Right 2008   KNEE BURSECTOMY Right 11/24/2021   Procedure: right knee bursectomy;  Surgeon: Cammy Copa, MD;  Location: [facility] OR;  Service: Orthopedics;  Laterality: Right;   KNEE SURGERY Left    x2   MAXILLARY ANTROSTOMY Bilateral 04/13/2015   Procedure: MAXILLARY ANTROSTOMY;  Surgeon: Bud Face, MD;  Location: [facility];  Service: ENT;  Laterality: Bilateral;    Family History  Problem Relation Age of Onset   Lung disease Father  Melanoma Mother    Heart attack Brother    Colon polyps Neg Hx    Colon cancer Neg Hx    Stomach cancer Neg Hx    Pancreatic cancer Neg Hx    Esophageal cancer Neg Hx     Allergies  Allergen Reactions   Aspirin Nausea And Vomiting    Current Outpatient Medications on File Prior to Visit  Medication Sig Dispense Refill   Artificial Tear Solution (SOOTHE XP) SOLN Place 1 drop into both eyes daily.     buPROPion ER (WELLBUTRIN SR) 100 MG 12 hr tablet TAKE 2 TABLETS BY MOUTH EVERY MORNING 180 tablet 0   cyclobenzaprine (FLEXERIL) 10 MG tablet TAKE 1 TABLET BY MOUTH AT BEDTIME 90 tablet 0   fluticasone (FLONASE) 50 MCG/ACT nasal spray SPRAY 1 SPRAY INTO EACH NOSTRIL EVERY DAY 48 g 1   gabapentin (NEURONTIN) 300 MG capsule TAKE 1 CAPSULE BY MOUTH THREE TIMES A DAY AS NEEDED     guaiFENesin (MUCINEX) 600 MG 12 hr tablet Take 600 mg by mouth 2  (two) times daily as needed (congestion).     hydrocortisone (ANUSOL-HC) 2.5 % rectal cream Place 1 Application rectally 2 (two) times daily. 30 g 3   ketoconazole (NIZORAL) 2 % cream Apply 1 Application topically 2 (two) times daily as needed for irritation. 30 g 2   levothyroxine (SYNTHROID) 75 MCG tablet Take 1 tablet (75 mcg total) by mouth daily before breakfast. 90 tablet 3   losartan (COZAAR) 100 MG tablet TAKE ONE TABLET BY MOUTH DAILY 90 tablet 3   lovastatin (MEVACOR) 40 MG tablet TAKE ONE TABLET BY MOUTH AT BEDTIME 90 tablet 3   meloxicam (MOBIC) 15 MG tablet TAKE 1 TABLET BY MOUTH DAILY AS NEEDED FOR PAIN 90 tablet 0   Multiple Vitamin (MULTIVITAMIN WITH MINERALS) TABS tablet Take 1 tablet by mouth daily.     Omega-3 Fatty Acids (FISH OIL) 1200 MG CAPS      omeprazole (PRILOSEC) 20 MG capsule TAKE ONE CAPSULE BY MOUTH DAILY 90 capsule 3   Probiotic Product (PROBIOTIC DAILY PO)      sildenafil (VIAGRA) 100 MG tablet TAKE 1 TABLET BY MOUTH EVERY DAY AS NEEDED FOR ERECTILE DYSFUNCTION 15 tablet 1   Turmeric (QC TUMERIC COMPLEX) 500 MG CAPS      lansoprazole (PREVACID) 30 MG capsule Take 1 capsule (30 mg total) by mouth daily at 12 noon. 30 capsule 0   metFORMIN (GLUCOPHAGE) 500 MG tablet Take 1 tablet (500 mg total) by mouth daily. 90 tablet 2   No current facility-administered medications on file prior to visit.    Pulse 69   Temp 98.1 F (36.7 C) (Oral)   Ht 6\' 2"  (1.88 m)   Wt 219 lb (99.3 kg)   SpO2 100%   BMI 28.12 kg/m       Objective:   Physical Exam Vitals and nursing note reviewed.  Constitutional:      Appearance: Normal appearance.  Cardiovascular:     Rate and Rhythm: Regular rhythm.  Skin:    General: Skin is warm and dry.     Findings: Erythema and rash present. Rash is macular.     Comments: He has areas of macular erythema into his groin and gluteal cleft  Neurological:     General: No focal deficit present.     Mental Status: He is alert and  oriented to person, place, and time.  Psychiatric:        Mood and  Affect: Mood normal.        Behavior: Behavior normal.        Thought Content: Thought content normal.        Judgment: Judgment normal.       Assessment & Plan:  1. Tinea cruris - Tinea infection not responding to antifungal cream. Will place on Lamisil oral. He can stop his lovastatin while taking this medication. Also advised using OTC antifungal powder - terbinafine (LAMISIL) 250 MG tablet; Take 1 tablet (250 mg total) by mouth daily for 21 days.  Dispense: 21 tablet; Refill: 0  Shirline Frees, NP

## 2023-07-09 ENCOUNTER — Other Ambulatory Visit: Payer: Self-pay | Admitting: Adult Health

## 2023-07-23 ENCOUNTER — Other Ambulatory Visit: Payer: Self-pay | Admitting: Adult Health

## 2023-08-13 ENCOUNTER — Other Ambulatory Visit: Payer: Self-pay | Admitting: Adult Health

## 2023-08-20 ENCOUNTER — Other Ambulatory Visit: Payer: Self-pay | Admitting: Adult Health

## 2023-09-17 ENCOUNTER — Encounter: Payer: Self-pay | Admitting: Adult Health

## 2023-09-18 ENCOUNTER — Telehealth: Payer: Commercial Managed Care - HMO | Admitting: Physician Assistant

## 2023-09-18 DIAGNOSIS — J019 Acute sinusitis, unspecified: Secondary | ICD-10-CM

## 2023-09-18 DIAGNOSIS — B9689 Other specified bacterial agents as the cause of diseases classified elsewhere: Secondary | ICD-10-CM

## 2023-09-18 MED ORDER — DOXYCYCLINE HYCLATE 100 MG PO TABS
100.0000 mg | ORAL_TABLET | Freq: Two times a day (BID) | ORAL | 0 refills | Status: DC
Start: 2023-09-18 — End: 2023-12-04

## 2023-09-18 MED ORDER — FLUTICASONE PROPIONATE 50 MCG/ACT NA SUSP: 2.0000 | Freq: Every day | NASAL | 0 refills | Status: AC

## 2023-09-18 NOTE — Progress Notes (Signed)
 Virtual Visit Consent   [name], you are scheduled for a virtual visit with a [facility] provider today. Just as with appointments in the office, your consent must be obtained to participate. Your consent will be active for this visit and any virtual visit you may have with one of our providers in the next 365 days. If you have a MyChart account, a copy of this consent can be sent to you electronically.  As this is a virtual visit, video technology does not allow for your provider to perform a traditional examination. This may limit your provider's ability to fully assess your condition. If your provider identifies any concerns that need to be evaluated in person or the need to arrange testing (such as labs, EKG, etc.), we will make arrangements to do so. Although advances in technology are sophisticated, we cannot ensure that it will always work on either your end or our end. If the connection with a video visit is poor, the visit may have to be switched to a telephone visit. With either a video or telephone visit, we are not always able to ensure that we have a secure connection.  By engaging in this virtual visit, you consent to the provision of healthcare and authorize for your insurance to be billed (if applicable) for the services provided during this visit. Depending on your insurance coverage, you may receive a charge related to this service.  I need to obtain your verbal consent now. Are you willing to proceed with your visit today? [name] has provided verbal consent on 09/18/2023 for a virtual visit (video or telephone). [name], [place]  Date: 09/18/2023 7:47 AM  Virtual Visit via Video Note   I, [name], connected with  [name] [facility]  ([id number], May 28, 1966) on 09/18/23 at  7:45 AM EST by a video-enabled telemedicine application and verified that I am speaking with the correct person using two identifiers.  Location: Patient: Virtual Visit Location  Patient: Home Provider: Virtual Visit Location Provider: Home Office   I discussed the limitations of evaluation and management by telemedicine and the availability of in person appointments. The patient expressed understanding and agreed to proceed.    History of Present Illness: [name] is a 58 y.o. who identifies as a male who was assigned male at birth, and is being seen today for several days of progressive nasal congestion, sinus pressure and drainage that is now thick and green-yellow. Denies fever, chills, aches. Denies recent travel or sick contact. Maxillary sinus pain.   OTC -- Mucinex  HPI: HPI  Problems:  Patient Active Problem List   Diagnosis Date Noted   Prepatellar bursitis, right knee    Depression 04/16/2018   Degenerative joint disease of knee, 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   Elevated fasting blood sugar 10/06/2014   Reflux esophagitis 04/22/2012   HYPOGONADISM 09/19/2010   Hyperlipidemia 01/17/2010   ASTHMA 10/25/2008   ERECTILE DYSFUNCTION 10/08/2008   Essential hypertension 10/16/2007   Allergic rhinitis 05/13/2007    Allergies:  Allergies  Allergen Reactions   Aspirin  Nausea And Vomiting   Medications:  Current Outpatient Medications:    doxycycline  (VIBRA -TABS) 100 MG tablet, Take 1 tablet (100 mg total) by mouth 2 (two) times daily., Disp: 20 tablet, Rfl: 0   fluticasone  (FLONASE ) 50 MCG/ACT nasal spray, Place 2 sprays into both nostrils daily., Disp: 16 g, Rfl: 0   Artificial Tear Solution (SOOTHE XP) SOLN, Place 1 drop into both eyes daily., Disp: ,  Rfl:    buPROPion  ER (WELLBUTRIN  SR) 100 MG 12 hr tablet, TAKE 2 TABLETS BY MOUTH EVERY MORNING, Disp: 180 tablet, Rfl: 0   cyclobenzaprine  (FLEXERIL ) 10 MG tablet, TAKE 1 TABLET BY MOUTH AT BEDTIME, Disp: 90 tablet, Rfl: 0   guaiFENesin (MUCINEX) 600 MG 12 hr tablet, Take 600 mg by mouth 2 (two) times daily as needed (congestion)., Disp: , Rfl:    hydrocortisone  (ANUSOL -HC) 2.5 % rectal cream, Place 1  Application rectally 2 (two) times daily., Disp: 30 g, Rfl: 3   losartan  (COZAAR ) 100 MG tablet, TAKE ONE TABLET BY MOUTH DAILY, Disp: 90 tablet, Rfl: 3   lovastatin  (MEVACOR ) 40 MG tablet, TAKE ONE TABLET BY MOUTH AT BEDTIME, Disp: 90 tablet, Rfl: 3   meloxicam  (MOBIC ) 15 MG tablet, TAKE 1 TABLET BY MOUTH DAILY AS NEEDED FOR PAIN, Disp: 90 tablet, Rfl: 1   metFORMIN  (GLUCOPHAGE ) 500 MG tablet, Take 1 tablet (500 mg total) by mouth daily., Disp: 90 tablet, Rfl: 2   Multiple Vitamin (MULTIVITAMIN WITH MINERALS) TABS tablet, Take 1 tablet by mouth daily., Disp: , Rfl:    Omega-3 Fatty Acids (FISH OIL) 1200 MG CAPS, , Disp: , Rfl:    omeprazole  (PRILOSEC) 20 MG capsule, TAKE ONE CAPSULE BY MOUTH DAILY, Disp: 90 capsule, Rfl: 3   Probiotic Product (PROBIOTIC DAILY PO), , Disp: , Rfl:    sildenafil  (VIAGRA ) 100 MG tablet, TAKE 1 TABLET BY MOUTH EVERY DAY AS NEEDED FOR ERECTILE DYSFUNCTION, Disp: 15 tablet, Rfl: 1   Turmeric (QC TUMERIC COMPLEX) 500 MG CAPS, , Disp: , Rfl:   Observations/Objective: Patient is well-developed, well-nourished in no acute distress.  Resting comfortably at home.  Head is normocephalic, atraumatic.  No labored breathing. Speech is clear and coherent with logical content.  Patient is alert and oriented at baseline.   Assessment and Plan: 1. Acute bacterial sinusitis (Primary) - fluticasone  (FLONASE ) 50 MCG/ACT nasal spray; Place 2 sprays into both nostrils daily.  Dispense: 16 g; Refill: 0 - doxycycline  (VIBRA -TABS) 100 MG tablet; Take 1 tablet (100 mg total) by mouth 2 (two) times daily.  Dispense: 20 tablet; Refill: 0  Rx Doxycycline .  Increase fluids.  Rest.  Saline nasal spray.  Probiotic.  Mucinex as directed.  Humidifier in bedroom. Flonase  per orders.  Call or return to clinic if symptoms are not improving.   Follow Up Instructions: I discussed the assessment and treatment plan with the patient. The patient was provided an opportunity to ask questions and all  were answered. The patient agreed with the plan and demonstrated an understanding of the instructions.  A copy of instructions were sent to the patient via MyChart unless otherwise noted below.   The patient was advised to call back or seek an in-person evaluation if the symptoms worsen or if the condition fails to improve as anticipated.    Jason Velma Lunger, PA-C

## 2023-09-18 NOTE — Patient Instructions (Signed)
 [name], thank you for joining Jason Velma Lunger, PA-C for today's virtual visit.  While this provider is not your primary care provider (PCP), if your PCP is located in our provider database this encounter information will be shared with them immediately following your visit.   A [name] MyChart account gives you access to today's visit and all your visits, tests, and labs performed at [facility]  click here if you don't have a [name] account or go to [email or web address]  Consent: (Patient) [name] provided verbal consent for this virtual visit at the beginning of the encounter.  Current Medications:  Current Outpatient Medications:    Artificial Tear Solution (SOOTHE XP) SOLN, Place 1 drop into both eyes daily., Disp: , Rfl:    buPROPion  ER (WELLBUTRIN  SR) 100 MG 12 hr tablet, TAKE 2 TABLETS BY MOUTH EVERY MORNING, Disp: 180 tablet, Rfl: 0   cyclobenzaprine  (FLEXERIL ) 10 MG tablet, TAKE 1 TABLET BY MOUTH AT BEDTIME, Disp: 90 tablet, Rfl: 0   fluticasone  (FLONASE ) 50 MCG/ACT nasal spray, SPRAY 1 SPRAY INTO EACH NOSTRIL EVERY DAY, Disp: 48 g, Rfl: 1   gabapentin  (NEURONTIN ) 300 MG capsule, TAKE 1 CAPSULE BY MOUTH THREE TIMES A DAY AS NEEDED, Disp: , Rfl:    guaiFENesin (MUCINEX) 600 MG 12 hr tablet, Take 600 mg by mouth 2 (two) times daily as needed (congestion)., Disp: , Rfl:    hydrocortisone  (ANUSOL -HC) 2.5 % rectal cream, Place 1 Application rectally 2 (two) times daily., Disp: 30 g, Rfl: 3   ketoconazole  (NIZORAL ) 2 % cream, Apply 1 Application topically 2 (two) times daily as needed for irritation., Disp: 30 g, Rfl: 2   lansoprazole  (PREVACID ) 30 MG capsule, Take 1 capsule (30 mg total) by mouth daily at 12 noon., Disp: 30 capsule, Rfl: 0   levothyroxine  (SYNTHROID ) 75 MCG tablet, Take 1 tablet (75 mcg total) by mouth daily before breakfast., Disp: 90 tablet, Rfl: 3   losartan  (COZAAR ) 100 MG tablet, TAKE ONE TABLET BY MOUTH DAILY,  Disp: 90 tablet, Rfl: 3   lovastatin  (MEVACOR ) 40 MG tablet, TAKE ONE TABLET BY MOUTH AT BEDTIME, Disp: 90 tablet, Rfl: 3   meloxicam  (MOBIC ) 15 MG tablet, TAKE 1 TABLET BY MOUTH DAILY AS NEEDED FOR PAIN, Disp: 90 tablet, Rfl: 1   metFORMIN  (GLUCOPHAGE ) 500 MG tablet, Take 1 tablet (500 mg total) by mouth daily., Disp: 90 tablet, Rfl: 2   Multiple Vitamin (MULTIVITAMIN WITH MINERALS) TABS tablet, Take 1 tablet by mouth daily., Disp: , Rfl:    Omega-3 Fatty Acids (FISH OIL) 1200 MG CAPS, , Disp: , Rfl:    omeprazole  (PRILOSEC) 20 MG capsule, TAKE ONE CAPSULE BY MOUTH DAILY, Disp: 90 capsule, Rfl: 3   Probiotic Product (PROBIOTIC DAILY PO), , Disp: , Rfl:    sildenafil  (VIAGRA ) 100 MG tablet, TAKE 1 TABLET BY MOUTH EVERY DAY AS NEEDED FOR ERECTILE DYSFUNCTION, Disp: 15 tablet, Rfl: 1   Turmeric (QC TUMERIC COMPLEX) 500 MG CAPS, , Disp: , Rfl:    Medications ordered in this encounter:  No orders of the defined types were placed in this encounter.    *If you need refills on other medications prior to your next appointment, please contact your pharmacy*  Follow-Up: Call back or seek an in-person evaluation if the symptoms worsen or if the condition fails to improve as anticipated.  [facility] [phone]  Other Instructions Please take antibiotic as directed.  Increase fluid intake.  Use Saline  nasal spray.  Take a daily multivitamin. Use the Flonase  as directed. Continue the Mucinex as directed.  Place a humidifier in the bedroom.  Please call or return clinic if symptoms are not improving.  Sinusitis Sinusitis is redness, soreness, and swelling (inflammation) of the paranasal sinuses. Paranasal sinuses are air pockets within the bones of your face (beneath the eyes, the middle of the forehead, or above the eyes). In healthy paranasal sinuses, mucus is able to drain out, and air is able to circulate through them by way of your nose. However, when your paranasal sinuses are  inflamed, mucus and air can become trapped. This can allow bacteria and other germs to grow and cause infection. Sinusitis can develop quickly and last only a short time (acute) or continue over a long period (chronic). Sinusitis that lasts for more than 12 weeks is considered chronic.  CAUSES  Causes of sinusitis include: Allergies. Structural abnormalities, such as displacement of the cartilage that separates your nostrils (deviated septum), which can decrease the air flow through your nose and sinuses and affect sinus drainage. Functional abnormalities, such as when the small hairs (cilia) that line your sinuses and help remove mucus do not work properly or are not present. SYMPTOMS  Symptoms of acute and chronic sinusitis are the same. The primary symptoms are pain and pressure around the affected sinuses. Other symptoms include: Upper toothache. Earache. Headache. Bad breath. Decreased sense of smell and taste. A cough, which worsens when you are lying flat. Fatigue. Fever. Thick drainage from your nose, which often is green and may contain pus (purulent). Swelling and warmth over the affected sinuses. DIAGNOSIS  Your caregiver will perform a physical exam. During the exam, your caregiver may: Look in your nose for signs of abnormal growths in your nostrils (nasal polyps). Tap over the affected sinus to check for signs of infection. View the inside of your sinuses (endoscopy) with a special imaging device with a light attached (endoscope), which is inserted into your sinuses. If your caregiver suspects that you have chronic sinusitis, one or more of the following tests may be recommended: Allergy tests. Nasal culture A sample of mucus is taken from your nose and sent to a lab and screened for bacteria. Nasal cytology A sample of mucus is taken from your nose and examined by your caregiver to determine if your sinusitis is related to an allergy. TREATMENT  Most cases of acute  sinusitis are related to a viral infection and will resolve on their own within 10 days. Sometimes medicines are prescribed to help relieve symptoms (pain medicine, decongestants, nasal steroid sprays, or saline sprays).  However, for sinusitis related to a bacterial infection, your caregiver will prescribe antibiotic medicines. These are medicines that will help kill the bacteria causing the infection.  Rarely, sinusitis is caused by a fungal infection. In theses cases, your caregiver will prescribe antifungal medicine. For some cases of chronic sinusitis, surgery is needed. Generally, these are cases in which sinusitis recurs more than 3 times per year, despite other treatments. HOME CARE INSTRUCTIONS  Drink plenty of water. Water helps thin the mucus so your sinuses can drain more easily. Use a humidifier. Inhale steam 3 to 4 times a day (for example, sit in the bathroom with the shower running). Apply a warm, moist washcloth to your face 3 to 4 times a day, or as directed by your caregiver. Use saline nasal sprays to help moisten and clean your sinuses. Take over-the-counter or prescription medicines for pain,  discomfort, or fever only as directed by your caregiver. SEEK IMMEDIATE MEDICAL CARE IF: You have increasing pain or severe headaches. You have nausea, vomiting, or drowsiness. You have swelling around your face. You have vision problems. You have a stiff neck. You have difficulty breathing. MAKE SURE YOU:  Understand these instructions. Will watch your condition. Will get help right away if you are not doing well or get worse. Document Released: 08/27/2005 Document Revised: 11/19/2011 Document Reviewed: 09/11/2011 [facility] Patient Information 2014 [place], [place].    If you have been instructed to have an in-person evaluation today at a local Urgent Care facility, please use the link below. It will take you to a list of all of our available [facility], including  address, phone number and hours of operation. Please do not delay care.  [facility]  If you or a family member do not have a primary care provider, use the link below to schedule a visit and establish care. When you choose a [facility] primary care physician or advanced practice provider, you gain a long-term partner in health. Find a Primary Care Provider  Learn more about [facility] in-office and virtual care options: [facility] - Get Care Now

## 2023-09-24 ENCOUNTER — Encounter: Payer: Self-pay | Admitting: Adult Health

## 2023-09-24 ENCOUNTER — Telehealth: Payer: Commercial Managed Care - HMO | Admitting: Adult Health

## 2023-09-24 DIAGNOSIS — I10 Essential (primary) hypertension: Secondary | ICD-10-CM

## 2023-09-24 MED ORDER — LOSARTAN POTASSIUM 100 MG PO TABS: 100.0000 mg | ORAL_TABLET | Freq: Every day | ORAL | 0 refills | Status: DC

## 2023-10-15 ENCOUNTER — Other Ambulatory Visit: Payer: Self-pay | Admitting: Adult Health

## 2023-10-24 ENCOUNTER — Encounter: Payer: Commercial Managed Care - HMO | Admitting: Adult Health

## 2023-10-27 ENCOUNTER — Encounter: Payer: Self-pay | Admitting: Adult Health

## 2023-11-06 ENCOUNTER — Encounter: Payer: Self-pay | Admitting: Adult Health

## 2023-11-06 MED ORDER — LOVASTATIN 40 MG PO TABS: 40.0000 mg | ORAL_TABLET | Freq: Every day | ORAL | 0 refills | Status: DC

## 2023-11-06 NOTE — Addendum Note (Signed)
 Addended by: Waymon Amato R on: 11/06/2023 04:22 PM   Modules accepted: Orders

## 2023-11-07 MED ORDER — CYCLOBENZAPRINE HCL 10 MG PO TABS: 10.0000 mg | ORAL_TABLET | Freq: Every day | ORAL | 0 refills | Status: DC

## 2023-11-07 MED ORDER — METFORMIN HCL 500 MG PO TABS: 500.0000 mg | ORAL_TABLET | Freq: Every day | ORAL | 0 refills | Status: DC

## 2023-12-04 ENCOUNTER — Encounter: Payer: Self-pay | Admitting: Adult Health

## 2023-12-04 ENCOUNTER — Ambulatory Visit (INDEPENDENT_AMBULATORY_CARE_PROVIDER_SITE_OTHER): Payer: Commercial Managed Care - HMO | Admitting: Adult Health

## 2023-12-04 VITALS — BP 122/82 | HR 84 | Temp 98.3°F | Ht 73.0 in | Wt 216.0 lb

## 2023-12-04 DIAGNOSIS — Z0000 Encounter for general adult medical examination without abnormal findings: Secondary | ICD-10-CM

## 2023-12-04 DIAGNOSIS — R7303 Prediabetes: Secondary | ICD-10-CM

## 2023-12-04 DIAGNOSIS — K2100 Gastro-esophageal reflux disease with esophagitis, without bleeding: Secondary | ICD-10-CM | POA: Diagnosis not present

## 2023-12-04 DIAGNOSIS — Z125 Encounter for screening for malignant neoplasm of prostate: Secondary | ICD-10-CM

## 2023-12-04 DIAGNOSIS — F329 Major depressive disorder, single episode, unspecified: Secondary | ICD-10-CM | POA: Diagnosis not present

## 2023-12-04 DIAGNOSIS — I10 Essential (primary) hypertension: Secondary | ICD-10-CM | POA: Diagnosis not present

## 2023-12-04 DIAGNOSIS — E785 Hyperlipidemia, unspecified: Secondary | ICD-10-CM | POA: Diagnosis not present

## 2023-12-04 DIAGNOSIS — Z23 Encounter for immunization: Secondary | ICD-10-CM | POA: Diagnosis not present

## 2023-12-04 DIAGNOSIS — M170 Bilateral primary osteoarthritis of knee: Secondary | ICD-10-CM

## 2023-12-04 DIAGNOSIS — N529 Male erectile dysfunction, unspecified: Secondary | ICD-10-CM

## 2023-12-04 DIAGNOSIS — E039 Hypothyroidism, unspecified: Secondary | ICD-10-CM

## 2023-12-04 LAB — COMPREHENSIVE METABOLIC PANEL WITH GFR
ALT: 28 U/L (ref 0–53)
AST: 17 U/L (ref 0–37)
Albumin: 4.7 g/dL (ref 3.5–5.2)
Alkaline Phosphatase: 42 U/L (ref 39–117)
BUN: 26 mg/dL — ABNORMAL HIGH (ref 6–23)
CO2: 27 meq/L (ref 19–32)
Calcium: 9.4 mg/dL (ref 8.4–10.5)
Chloride: 103 meq/L (ref 96–112)
Creatinine, Ser: 0.91 mg/dL (ref 0.40–1.50)
GFR: 93.43 mL/min (ref >60.00)
Glucose, Bld: 122 mg/dL — ABNORMAL HIGH (ref 70–99)
Potassium: 4.5 meq/L (ref 3.5–5.1)
Sodium: 138 meq/L (ref 135–145)
Total Bilirubin: 0.7 mg/dL (ref 0.2–1.2)
Total Protein: 6.7 g/dL (ref 6.0–8.3)

## 2023-12-04 LAB — CBC
HCT: 45.1 % (ref 39.0–52.0)
Hemoglobin: 15.4 g/dL (ref 13.0–17.0)
MCHC: 34.2 g/dL (ref 30.0–36.0)
MCV: 88.1 fl (ref 78.0–100.0)
Platelets: 185 10*3/uL (ref 150.0–400.0)
RBC: 5.13 Mil/uL (ref 4.22–5.81)
RDW: 12.8 % (ref 11.5–15.5)
WBC: 4.6 10*3/uL (ref 4.0–10.5)

## 2023-12-04 LAB — LIPID PANEL
Cholesterol: 203 mg/dL — ABNORMAL HIGH (ref 0–200)
HDL: 41.8 mg/dL (ref >39.00)
LDL Cholesterol: 137 mg/dL — ABNORMAL HIGH (ref 0–99)
NonHDL: 161.07
Total CHOL/HDL Ratio: 5
Triglycerides: 119 mg/dL (ref 0.0–149.0)
VLDL: 23.8 mg/dL (ref 0.0–40.0)

## 2023-12-04 LAB — HEMOGLOBIN A1C: Hgb A1c MFr Bld: 6.2 % (ref 4.6–6.5)

## 2023-12-04 MED ORDER — CYCLOBENZAPRINE HCL 10 MG PO TABS: 10.0000 mg | ORAL_TABLET | Freq: Every day | ORAL | 1 refills | Status: DC

## 2023-12-04 NOTE — Progress Notes (Signed)
 Subjective:    Patient ID: [name], male    DOB: Jan 13, 1966, 58 y.o.   MRN: [id number]  HPI Patient presents for yearly preventative medicine examination. He is a pleasant 58 year old male who  has a past medical history of ALLERGIC RHINITIS, ANXIETY, ASTHMA, BACK PAIN, ERECTILE DYSFUNCTION, GERD, HYPERLIPIDEMIA, HYPERTENSION, LUMBAR STRAIN, NECK PAIN, PANCREATITIS, HX OF, PONV (postoperative nausea and vomiting), Seasonal allergies, SHOULDER PAIN, LEFT, Substance abuse (HCC), and TENDINITIS, PATELLAR.  Hypertension-takes Cozaar 100 mg QHS.  He denies dizziness, lightheadedness, chest pain, shortness of breath.  Does not check his blood pressure at home. Denies dizziness, lightheadedness, blurred vision, or headaches.  BP Readings from Last 3 Encounters:  12/04/23 122/82  06/27/23 130/88  06/07/23 124/74   Hyperlipidemia -prescribed lovastatin 40 mg daily.  He denies myalgia or fatigue Lab Results  Component Value Date   CHOL 162 11/30/2022   HDL 39.90 11/30/2022   LDLCALC 105 (H) 11/30/2022   LDLDIRECT 147.2 07/24/2012   TRIG 84.0 11/30/2022   CHOLHDL 4 11/30/2022   GERD - takes prilosec 20 mg daily. Feels well controlled  Depression -well-controlled with Wellbutrin 200 mg daily.  Chronic knee pain due to osteoarthritis-pain controlled with  Mobic 15mg . And Flexeril 10 mg He has stopped Tramadol.   ED-uses Viagra as needed  Hypothyroidism - well controlled on Synthroid 75 mcg daily   Prediabetes - managed with metformin 500 mg daily. He tolerates this medication well  Lab Results  Component Value Date   HGBA1C 5.8 (A) 02/26/2023   HGBA1C 6.2 11/30/2022   HGBA1C 6.0 10/17/2021    All immunizations and health maintenance protocols were reviewed with the patient and needed orders were placed.  Appropriate screening laboratory values were ordered for the patient including screening of hyperlipidemia, renal function and hepatic function. If indicated by BPH, a PSA was  ordered.  Medication reconciliation,  past medical history, social history, problem list and allergies were reviewed in detail with the patient  Goals were established with regard to weight loss, exercise, and  diet in compliance with medications  Wt Readings from Last 3 Encounters:  12/04/23 216 lb (98 kg)  06/27/23 219 lb (99.3 kg)  06/07/23 214 lb (97.1 kg)   He is up to date on routine colon cancer screening    Review of Systems  Constitutional: Negative.   HENT: Negative.    Eyes: Negative.   Respiratory: Negative.    Cardiovascular: Negative.   Gastrointestinal: Negative.   Endocrine: Negative.   Genitourinary: Negative.   Musculoskeletal:  Positive for arthralgias.  Skin: Negative.   Allergic/Immunologic: Negative.   Neurological: Negative.   Hematological: Negative.   Psychiatric/Behavioral: Negative.    All other systems reviewed and are negative.  Past Medical History:  Diagnosis Date   ALLERGIC RHINITIS    ANXIETY    ASTHMA    BACK PAIN    ERECTILE DYSFUNCTION    GERD    HYPERLIPIDEMIA    HYPERTENSION    LUMBAR STRAIN    NECK PAIN    PANCREATITIS, HX OF    PONV (postoperative nausea and vomiting)    nausea only    Seasonal allergies    SHOULDER PAIN, LEFT    Substance abuse (HCC)    quit in 2007   TENDINITIS, PATELLAR     Social History   Socioeconomic History   Marital status: Married    Spouse name: Not on file   Number of children: Not on file  Years of education: Not on file   Highest education level: Not on file  Occupational History   Not on file  Tobacco Use   Smoking status: Never   Smokeless tobacco: Current    Types: Chew   Tobacco comments:    form given 11/24/15  Vaping Use   Vaping status: Never Used  Substance and Sexual Activity   Alcohol use: No    Alcohol/week: 0.0 standard drinks of alcohol    Comment: hx alcohol abuse   Drug use: No   Sexual activity: Not on file  Other Topics Concern   Not on file  Social  History Narrative   Works as a Music therapist   Married to [employer]       Social Drivers of Health   Financial Resource Strain: Not on file  Food Insecurity: Not on file  Transportation Needs: Not on file  Physical Activity: Not on file  Stress: Not on file  Social Connections: Not on file  Intimate Partner Violence: Not on file    Past Surgical History:  Procedure Laterality Date   IMAGE GUIDED SINUS SURGERY N/A 04/13/2015   Procedure: IMAGE GUIDED SINUS SURGERY;  Surgeon: Bud Face, MD;  Location: [facility];  Service: ENT;  Laterality: N/A;  GAVE DISK TO CE CE   KNEE ARTHROSCOPY Right 2008   KNEE BURSECTOMY Right 11/24/2021   Procedure: right knee bursectomy;  Surgeon: Cammy Copa, MD;  Location[facility];  Service: Orthopedics;  Laterality: Right;   KNEE SURGERY Left    x2   MAXILLARY ANTROSTOMY Bilateral 04/13/2015   Procedure: MAXILLARY ANTROSTOMY;  Surgeon: Bud Face, MD;  Location: [facility];  Service: ENT;  Laterality: Bilateral;    Family History  Problem Relation Age of Onset   Lung disease Father    Melanoma Mother    Heart attack Brother    Colon polyps Neg Hx    Colon cancer Neg Hx    Stomach cancer Neg Hx    Pancreatic cancer Neg Hx    Esophageal cancer Neg Hx     Allergies  Allergen Reactions   Aspirin Nausea And Vomiting    Current Outpatient Medications on File Prior to Visit  Medication Sig Dispense Refill   Artificial Tear Solution (SOOTHE XP) SOLN Place 1 drop into both eyes daily.     buPROPion ER (WELLBUTRIN SR) 100 MG 12 hr tablet TAKE 2 TABLETS BY MOUTH EVERY MORNING 180 tablet 0   cyclobenzaprine (FLEXERIL) 10 MG tablet Take 1 tablet (10 mg total) by mouth at bedtime. 30 tablet 0   fluticasone (FLONASE) 50 MCG/ACT nasal spray Place 2 sprays into both nostrils daily. 16 g 0   gabapentin (NEURONTIN) 300 MG capsule Take 1 capsule as needed by oral route in the evening for 30 days.     losartan (COZAAR) 100 MG  tablet Take 1 tablet (100 mg total) by mouth daily. 90 tablet 0   lovastatin (MEVACOR) 40 MG tablet Take 1 tablet (40 mg total) by mouth at bedtime. 90 tablet 0   meloxicam (MOBIC) 15 MG tablet TAKE 1 TABLET BY MOUTH DAILY AS NEEDED FOR PAIN 90 tablet 1   metFORMIN (GLUCOPHAGE) 500 MG tablet Take 1 tablet (500 mg total) by mouth daily. 30 tablet 0   Multiple Vitamin (MULTIVITAMIN WITH MINERALS) TABS tablet Take 1 tablet by mouth daily.     Omega-3 Fatty Acids (FISH OIL) 1200 MG CAPS      omeprazole (PRILOSEC) 20 MG  capsule TAKE ONE CAPSULE BY MOUTH DAILY 90 capsule 3   Probiotic Product (PROBIOTIC DAILY PO)      sildenafil (VIAGRA) 100 MG tablet TAKE 1 TABLET BY MOUTH EVERY DAY AS NEEDED FOR ERECTILE DYSFUNCTION 15 tablet 1   Turmeric (QC TUMERIC COMPLEX) 500 MG CAPS      guaiFENesin (MUCINEX) 600 MG 12 hr tablet Take 600 mg by mouth 2 (two) times daily as needed (congestion). (Patient not taking: Reported on 12/04/2023)     hydrocortisone (ANUSOL-HC) 2.5 % rectal cream Place 1 Application rectally 2 (two) times daily. (Patient not taking: Reported on 12/04/2023) 30 g 3   No current facility-administered medications on file prior to visit.    BP 122/82   Pulse 84   Temp 98.3 F (36.8 C) (Oral)   Ht 6\' 1"  (1.854 m)   Wt 216 lb (98 kg)   SpO2 99%   BMI 28.50 kg/m       Objective:   Physical Exam Vitals and nursing note reviewed.  Constitutional:      General: He is not in acute distress.    Appearance: Normal appearance. He is not ill-appearing.  HENT:     Head: Normocephalic and atraumatic.     Right Ear: Tympanic membrane, ear canal and external ear normal. There is no impacted cerumen.     Left Ear: Tympanic membrane, ear canal and external ear normal. There is no impacted cerumen.     Nose: Nose normal. No congestion or rhinorrhea.     Mouth/Throat:     Mouth: Mucous membranes are moist.     Pharynx: Oropharynx is clear.  Eyes:     Extraocular Movements: Extraocular  movements intact.     Conjunctiva/sclera: Conjunctivae normal.     Pupils: Pupils are equal, round, and reactive to light.  Neck:     Vascular: No carotid bruit.  Cardiovascular:     Rate and Rhythm: Normal rate and regular rhythm.     Pulses: Normal pulses.     Heart sounds: No murmur heard.    No friction rub. No gallop.  Pulmonary:     Effort: Pulmonary effort is normal.     Breath sounds: Normal breath sounds.  Abdominal:     General: Abdomen is flat. Bowel sounds are normal. There is no distension.     Palpations: Abdomen is soft. There is no mass.     Tenderness: There is no abdominal tenderness. There is no guarding or rebound.     Hernia: No hernia is present.  Musculoskeletal:        General: Normal range of motion.     Cervical back: Normal range of motion and neck supple.  Lymphadenopathy:     Cervical: No cervical adenopathy.  Skin:    General: Skin is warm and dry.     Capillary Refill: Capillary refill takes less than 2 seconds.  Neurological:     General: No focal deficit present.     Mental Status: He is alert and oriented to person, place, and time.  Psychiatric:        Mood and Affect: Mood normal.        Behavior: Behavior normal.        Thought Content: Thought content normal.        Judgment: Judgment normal.           Assessment & Plan:  1. Routine general medical examination at a health care facility (Primary) Today patient counseled on age appropriate  routine health concerns for screening and prevention, each reviewed and up to date or declined. Immunizations reviewed and up to date or declined. Labs ordered and reviewed. Risk factors for depression reviewed and negative. Hearing function and visual acuity are intact. ADLs screened and addressed as needed. Functional ability and level of safety reviewed and appropriate. Education, counseling and referrals performed based on assessed risks today. Patient provided with a copy of personalized plan for  preventive services. - Follow up in one year or sooner if needed  2. Essential hypertension - well controlled. No change in medication  - Lipid panel; Future - TSH; Future - CBC; Future - Comprehensive metabolic panel; Future  3. Hyperlipidemia, unspecified hyperlipidemia type - Consider increase in statin  - Lipid panel; Future - TSH; Future - CBC; Future - Comprehensive metabolic panel; Future  4. Gastroesophageal reflux disease with esophagitis without hemorrhage - Continue with PPI  - Lipid panel; Future - TSH; Future - CBC; Future - Comprehensive metabolic panel; Future  5. Reactive depression - Continue with Wellbutrin   - Lipid panel; Future - TSH; Future - CBC; Future - Comprehensive metabolic panel; Future  6. Primary osteoarthritis of both knees - Continue with Mobic and Flexeril as needed - Lipid panel; Future - TSH; Future - CBC; Future - Comprehensive metabolic panel; Future - cyclobenzaprine (FLEXERIL) 10 MG tablet; Take 1 tablet (10 mg total) by mouth at bedtime.  Dispense: 90 tablet; Refill: 1  7. Erectile dysfunction, unspecified erectile dysfunction type - Continue with Viagra  - Lipid panel; Future - TSH; Future - CBC; Future - Comprehensive metabolic panel; Future  8. Acquired hypothyroidism - Consider dose change of synthroid  - Lipid panel; Future - TSH; Future - CBC; Future - Comprehensive metabolic panel; Future  9. Prediabetes - Consider dose change of metformin  - Lipid panel; Future - TSH; Future - CBC; Future - Comprehensive metabolic panel; Future - Hemoglobin A1c; Future  10. Prostate cancer screening  - PSA; Future  11. Need for pneumococcal vaccine  - Pneumococcal conjugate vaccine 20-valent (Prevnar 20)  Shirline Frees, NP

## 2023-12-04 NOTE — Patient Instructions (Signed)
 It was great seeing you today   We will follow up with you regarding your lab work   Please let me know if you need anything

## 2023-12-05 LAB — PSA: PSA: 2.85 ng/mL (ref 0.10–4.00)

## 2023-12-05 LAB — TSH: TSH: 3.16 u[IU]/mL (ref 0.35–5.50)

## 2023-12-06 ENCOUNTER — Other Ambulatory Visit: Payer: Self-pay

## 2023-12-06 MED ORDER — ROSUVASTATIN CALCIUM 20 MG PO TABS: 20.0000 mg | ORAL_TABLET | Freq: Every day | ORAL | 3 refills | Status: AC

## 2023-12-08 ENCOUNTER — Other Ambulatory Visit: Payer: Self-pay | Admitting: Adult Health

## 2023-12-10 ENCOUNTER — Other Ambulatory Visit: Payer: Self-pay | Admitting: Adult Health

## 2023-12-10 DIAGNOSIS — R1013 Epigastric pain: Secondary | ICD-10-CM

## 2023-12-12 ENCOUNTER — Encounter: Payer: Self-pay | Admitting: Adult Health

## 2023-12-12 ENCOUNTER — Ambulatory Visit: Admitting: Adult Health

## 2023-12-12 VITALS — BP 122/76 | HR 77 | Temp 97.9°F | Ht 73.0 in | Wt 216.0 lb

## 2023-12-12 DIAGNOSIS — B356 Tinea cruris: Secondary | ICD-10-CM

## 2023-12-12 DIAGNOSIS — L739 Follicular disorder, unspecified: Secondary | ICD-10-CM

## 2023-12-12 MED ORDER — CLINDAMYCIN PHOSPHATE 1 % EX LOTN: TOPICAL_LOTION | Freq: Two times a day (BID) | CUTANEOUS | 1 refills | Status: DC

## 2023-12-12 MED ORDER — TERBINAFINE HCL 250 MG PO TABS: 250.0000 mg | ORAL_TABLET | Freq: Every day | ORAL | 0 refills | Status: AC

## 2023-12-12 NOTE — Progress Notes (Signed)
 Subjective:    Patient ID: [name], male    DOB: 1965/09/27, 58 y.o.   MRN: [id number]  Rash   58 year old male who  has a past medical history of ALLERGIC RHINITIS, ANXIETY, ASTHMA, BACK PAIN, ERECTILE DYSFUNCTION, GERD, HYPERLIPIDEMIA, HYPERTENSION, LUMBAR STRAIN, NECK PAIN, PANCREATITIS, HX OF, PONV (postoperative nausea and vomiting), Seasonal allergies, SHOULDER PAIN, LEFT, Substance abuse (HCC), and TENDINITIS, PATELLAR.  He presents to the office today for a rash.  He reports having similar rash last year for which we treated with Lamisil.  Rash presents in the same area between his buttocks, around his hips and in his groin.  Ketoconazole cream has not been effective in the past.  This rash burns and itches and is red and flat.  Additionally he has developed a rash over the last 2 weeks on his upper arms.  Reports that for this rash he will get these little "water blisters" that pop and then scab over.   Review of Systems  Skin:  Positive for rash.   See HPI   Past Medical History:  Diagnosis Date   ALLERGIC RHINITIS    ANXIETY    ASTHMA    BACK PAIN    ERECTILE DYSFUNCTION    GERD    HYPERLIPIDEMIA    HYPERTENSION    LUMBAR STRAIN    NECK PAIN    PANCREATITIS, HX OF    PONV (postoperative nausea and vomiting)    nausea only    Seasonal allergies    SHOULDER PAIN, LEFT    Substance abuse (HCC)    quit in 2007   TENDINITIS, PATELLAR     Social History   Socioeconomic History   Marital status: Married    Spouse name: Not on file   Number of children: Not on file   Years of education: Not on file   Highest education level: Not on file  Occupational History   Not on file  Tobacco Use   Smoking status: Never   Smokeless tobacco: Current    Types: Chew   Tobacco comments:    form given 11/24/15  Vaping Use   Vaping status: Never Used  Substance and Sexual Activity   Alcohol use: No    Alcohol/week: 0.0 standard drinks of alcohol    Comment: hx  alcohol abuse   Drug use: No   Sexual activity: Not on file  Other Topics Concern   Not on file  Social History Narrative   Works as a Music therapist   Married to [employer]       Social Drivers of Health   Financial Resource Strain: Not on file  Food Insecurity: Not on file  Transportation Needs: Not on file  Physical Activity: Not on file  Stress: Not on file  Social Connections: Not on file  Intimate Partner Violence: Not on file    Past Surgical History:  Procedure Laterality Date   IMAGE GUIDED SINUS SURGERY N/A 04/13/2015   Procedure: IMAGE GUIDED SINUS SURGERY;  Surgeon: Bud Face, MD;  Location: [facility];  Service: ENT;  Laterality: N/A;  GAVE DISK TO CE CE   KNEE ARTHROSCOPY Right 2008   KNEE BURSECTOMY Right 11/24/2021   Procedure: right knee bursectomy;  Surgeon: Cammy Copa, MD;  Location: [facility] OR;  Service: Orthopedics;  Laterality: Right;   KNEE SURGERY Left    x2   MAXILLARY ANTROSTOMY Bilateral 04/13/2015   Procedure: MAXILLARY ANTROSTOMY;  Surgeon: Bud Face, MD;  Location:  [facility];  Service: ENT;  Laterality: Bilateral;    Family History  Problem Relation Age of Onset   Lung disease Father    Melanoma Mother    Heart attack Brother    Colon polyps Neg Hx    Colon cancer Neg Hx    Stomach cancer Neg Hx    Pancreatic cancer Neg Hx    Esophageal cancer Neg Hx     Allergies  Allergen Reactions   Aspirin Nausea And Vomiting    Current Outpatient Medications on File Prior to Visit  Medication Sig Dispense Refill   Artificial Tear Solution (SOOTHE XP) SOLN Place 1 drop into both eyes daily.     buPROPion ER (WELLBUTRIN SR) 100 MG 12 hr tablet TAKE 2 TABLETS BY MOUTH EVERY MORNING 180 tablet 0   cyclobenzaprine (FLEXERIL) 10 MG tablet Take 1 tablet (10 mg total) by mouth at bedtime. 90 tablet 1   fluticasone (FLONASE) 50 MCG/ACT nasal spray Place 2 sprays into both nostrils daily. 16 g 0   gabapentin (NEURONTIN) 300  MG capsule Take 1 capsule as needed by oral route in the evening for 30 days.     losartan (COZAAR) 100 MG tablet Take 1 tablet (100 mg total) by mouth daily. 90 tablet 0   meloxicam (MOBIC) 15 MG tablet TAKE 1 TABLET BY MOUTH DAILY AS NEEDED FOR PAIN 90 tablet 1   metFORMIN (GLUCOPHAGE) 500 MG tablet Take 1 tablet (500 mg total) by mouth daily. 30 tablet 0   Multiple Vitamin (MULTIVITAMIN WITH MINERALS) TABS tablet Take 1 tablet by mouth daily.     Omega-3 Fatty Acids (FISH OIL) 1200 MG CAPS      omeprazole (PRILOSEC) 20 MG capsule TAKE 1 CAPSULE BY MOUTH DAILY 90 capsule 3   Probiotic Product (PROBIOTIC DAILY PO)      rosuvastatin (CRESTOR) 20 MG tablet Take 1 tablet (20 mg total) by mouth daily. 90 tablet 3   sildenafil (VIAGRA) 100 MG tablet TAKE 1 TABLET BY MOUTH EVERY DAY AS NEEDED FOR ERECTILE DYSFUNCTION 15 tablet 1   Turmeric (QC TUMERIC COMPLEX) 500 MG CAPS      No current facility-administered medications on file prior to visit.    BP 122/76 (BP Location: Left Arm, Patient Position: Sitting, Cuff Size: Large)   Pulse 77   Temp 97.9 F (36.6 C) (Oral)   Ht 6\' 1"  (1.854 m)   Wt 216 lb (98 kg)   SpO2 97%   BMI 28.50 kg/m       Objective:   Physical Exam Vitals and nursing note reviewed.  Constitutional:      Appearance: Normal appearance.  Skin:    General: Skin is warm and dry.     Findings: Erythema and rash present. Rash is papular.       Neurological:     General: No focal deficit present.     Mental Status: He is alert and oriented to person, place, and time.  Psychiatric:        Mood and Affect: Mood normal.        Behavior: Behavior normal.        Thought Content: Thought content normal.        Judgment: Judgment normal.        Assessment & Plan:  1. Tinea cruris (Primary)  - terbinafine (LAMISIL) 250 MG tablet; Take 1 tablet (250 mg total) by mouth daily for 21 days.  Dispense: 21 tablet; Refill: 0  2. Folliculitis -  Will refrain from doxycyline  due to being in the sun often throughout the day.  Follow up in 2 weeks if not resolving  - clindamycin (CLEOCIN-T) 1 % lotion; Apply topically 2 (two) times daily.  Dispense: 60 mL; Refill: 1  Shirline Frees, NP  Time spent with patient today was 31 minutes which consisted of chart review, discussing Tinea Cruris and folliculitis , work up, treatment answering questions and documentation.

## 2023-12-20 ENCOUNTER — Other Ambulatory Visit: Payer: Self-pay | Admitting: Adult Health

## 2023-12-20 DIAGNOSIS — N529 Male erectile dysfunction, unspecified: Secondary | ICD-10-CM

## 2023-12-21 ENCOUNTER — Other Ambulatory Visit: Payer: Self-pay | Admitting: Adult Health

## 2023-12-21 DIAGNOSIS — I10 Essential (primary) hypertension: Secondary | ICD-10-CM

## 2024-01-07 ENCOUNTER — Ambulatory Visit: Admitting: Adult Health

## 2024-01-07 VITALS — BP 130/90 | HR 65 | Temp 98.4°F | Ht 73.0 in | Wt 214.0 lb

## 2024-01-07 DIAGNOSIS — L739 Follicular disorder, unspecified: Secondary | ICD-10-CM | POA: Diagnosis not present

## 2024-01-07 DIAGNOSIS — B356 Tinea cruris: Secondary | ICD-10-CM

## 2024-01-07 DIAGNOSIS — K649 Unspecified hemorrhoids: Secondary | ICD-10-CM | POA: Diagnosis not present

## 2024-01-07 DIAGNOSIS — R7303 Prediabetes: Secondary | ICD-10-CM | POA: Diagnosis not present

## 2024-01-07 MED ORDER — METFORMIN HCL 500 MG PO TABS: 500.0000 mg | ORAL_TABLET | Freq: Every day | ORAL | 1 refills | Status: DC

## 2024-01-07 MED ORDER — CLOTRIMAZOLE-BETAMETHASONE 1-0.05 % EX CREA: 1.0000 | TOPICAL_CREAM | Freq: Every day | CUTANEOUS | 0 refills | Status: AC

## 2024-01-07 NOTE — Progress Notes (Signed)
 Subjective:    Patient ID: [name], male    DOB: 06/11/1966, 58 y.o.   MRN: [id number]  Rash   58 year old male who  has a past medical history of ALLERGIC RHINITIS, ANXIETY, ASTHMA, BACK PAIN, ERECTILE DYSFUNCTION, GERD, HYPERLIPIDEMIA, HYPERTENSION, LUMBAR STRAIN, NECK PAIN, PANCREATITIS, HX OF, PONV (postoperative nausea and vomiting), Seasonal allergies, SHOULDER PAIN, LEFT, Substance abuse (HCC), and TENDINITIS, PATELLAR.  He presents to the office today for follow up regarding " a rash". He was last seen about four weeks ago for suspected tinea cruris and folliculitis. He was prescribed a course of Lamisil  oral for 3 weeks and clindamycin  gel. The rash on his flanks has improved but he still has a red irritated rash in his gluteal cleft.    Review of Systems  Skin:  Positive for rash.   See HPI   Past Medical History:  Diagnosis Date   ALLERGIC RHINITIS    ANXIETY    ASTHMA    BACK PAIN    ERECTILE DYSFUNCTION    GERD    HYPERLIPIDEMIA    HYPERTENSION    LUMBAR STRAIN    NECK PAIN    PANCREATITIS, HX OF    PONV (postoperative nausea and vomiting)    nausea only    Seasonal allergies    SHOULDER PAIN, LEFT    Substance abuse (HCC)    quit in 2007   TENDINITIS, PATELLAR     Social History   Socioeconomic History   Marital status: Married    Spouse name: Not on file   Number of children: Not on file   Years of education: Not on file   Highest education level: Not on file  Occupational History   Not on file  Tobacco Use   Smoking status: Never   Smokeless tobacco: Current    Types: Chew   Tobacco comments:    form given 11/24/15  Vaping Use   Vaping status: Never Used  Substance and Sexual Activity   Alcohol use: No    Alcohol/week: 0.0 standard drinks of alcohol    Comment: hx alcohol abuse   Drug use: No   Sexual activity: Not on file  Other Topics Concern   Not on file  Social History Narrative   Works as a Music therapist   Married to [employer]       Social Drivers of Health   Financial Resource Strain: Not on file  Food Insecurity: Not on file  Transportation Needs: Not on file  Physical Activity: Not on file  Stress: Not on file  Social Connections: Not on file  Intimate Partner Violence: Not on file    Past Surgical History:  Procedure Laterality Date   IMAGE GUIDED SINUS SURGERY N/A 04/13/2015   Procedure: IMAGE GUIDED SINUS SURGERY;  Surgeon: Rogers Clayman, MD;  Location: [facility];  Service: ENT;  Laterality: N/A;  GAVE DISK TO CE CE   KNEE ARTHROSCOPY Right 2008   KNEE BURSECTOMY Right 11/24/2021   Procedure: right knee bursectomy;  Surgeon: Jasmine Mesi, MD;  Location: [facility] OR;  Service: Orthopedics;  Laterality: Right;   KNEE SURGERY Left    x2   MAXILLARY ANTROSTOMY Bilateral 04/13/2015   Procedure: MAXILLARY ANTROSTOMY;  Surgeon: Rogers Clayman, MD;  Location: [facility];  Service: ENT;  Laterality: Bilateral;    Family History  Problem Relation Age of Onset   Lung disease Father    Melanoma Mother    Heart attack  Brother    Colon polyps Neg Hx    Colon cancer Neg Hx    Stomach cancer Neg Hx    Pancreatic cancer Neg Hx    Esophageal cancer Neg Hx     Allergies  Allergen Reactions   Aspirin  Nausea And Vomiting    Current Outpatient Medications on File Prior to Visit  Medication Sig Dispense Refill   Artificial Tear Solution (SOOTHE XP) SOLN Place 1 drop into both eyes daily.     buPROPion  ER (WELLBUTRIN  SR) 100 MG 12 hr tablet TAKE 2 TABLETS BY MOUTH EVERY MORNING 180 tablet 0   clindamycin  (CLEOCIN -T) 1 % lotion Apply topically 2 (two) times daily. 60 mL 1   cyclobenzaprine  (FLEXERIL ) 10 MG tablet Take 1 tablet (10 mg total) by mouth at bedtime. 90 tablet 1   fluticasone  (FLONASE ) 50 MCG/ACT nasal spray Place 2 sprays into both nostrils daily. 16 g 0   gabapentin  (NEURONTIN ) 300 MG capsule Take 1 capsule as needed by oral route in the evening for 30 days.      hydrocortisone  (ANUSOL -HC) 2.5 % rectal cream Place rectally 2 (two) times daily.     losartan  (COZAAR ) 100 MG tablet TAKE 1 TABLET(100 MG) BY MOUTH DAILY 90 tablet 0   meloxicam  (MOBIC ) 15 MG tablet TAKE 1 TABLET BY MOUTH DAILY AS NEEDED FOR PAIN 90 tablet 1   metFORMIN  (GLUCOPHAGE ) 500 MG tablet Take 1 tablet (500 mg total) by mouth daily. 30 tablet 0   Multiple Vitamin (MULTIVITAMIN WITH MINERALS) TABS tablet Take 1 tablet by mouth daily.     Omega-3 Fatty Acids (FISH OIL) 1200 MG CAPS      omeprazole  (PRILOSEC) 20 MG capsule TAKE 1 CAPSULE BY MOUTH DAILY 90 capsule 3   Probiotic Product (PROBIOTIC DAILY PO)      rosuvastatin  (CRESTOR ) 20 MG tablet Take 1 tablet (20 mg total) by mouth daily. 90 tablet 3   sildenafil  (VIAGRA ) 100 MG tablet TAKE 1 TABLET BY MOUTH EVERY DAY AS NEEDED FOR ERECTILE DYSFUNCTION 15 tablet 1   Turmeric (QC TUMERIC COMPLEX) 500 MG CAPS      No current facility-administered medications on file prior to visit.    BP (!) 130/90   Pulse 65   Temp 98.4 F (36.9 C) (Oral)   Ht 6\' 1"  (1.854 m)   Wt 214 lb (97.1 kg)   SpO2 97%   BMI 28.23 kg/m       Objective:   Physical Exam Vitals and nursing note reviewed.  Constitutional:      Appearance: Normal appearance.  Cardiovascular:     Rate and Rhythm: Normal rate and regular rhythm.  Pulmonary:     Effort: Pulmonary effort is normal.     Breath sounds: Normal breath sounds.  Skin:    Findings: Rash present.  Neurological:     General: No focal deficit present.     Mental Status: He is alert and oriented to person, place, and time.  Psychiatric:        Mood and Affect: Mood normal.        Behavior: Behavior normal.        Thought Content: Thought content normal.        Judgment: Judgment normal.        Assessment & Plan:  1. Tinea cruris (Primary) - improving but I would like him to see Dermatology for his various rashes since they are not resolving.  - We self scheduled him with [facility]  Dermatology.  -  clotrimazole-betamethasone  (LOTRISONE) cream; Apply 1 Application topically daily.  Dispense: 30 g; Refill: 0  2. Folliculitis - Will have him stop Clinda gel and trial Lotrisone cream  - clotrimazole-betamethasone  (LOTRISONE) cream; Apply 1 Application topically daily.  Dispense: 30 g; Refill: 0  3. Prediabetes - Needs refill  - metFORMIN  (GLUCOPHAGE ) 500 MG tablet; Take 1 tablet (500 mg total) by mouth daily.  Dispense: 90 tablet; Refill: 1  Alto Atta, NP  Time spent with patient today was 31 minutes which consisted of chart review, discussing skin disorder and prediabetes, work up, treatment answering questions and documentation.

## 2024-01-07 NOTE — Patient Instructions (Addendum)
 I am going to send in a new cream for you to try   We referred you to [facility] Address: [address], [place], [place] [address] Phone: [phone]

## 2024-02-06 ENCOUNTER — Other Ambulatory Visit: Payer: Self-pay | Admitting: Adult Health

## 2024-02-18 ENCOUNTER — Other Ambulatory Visit: Payer: Self-pay | Admitting: Adult Health

## 2024-02-18 ENCOUNTER — Encounter: Payer: Self-pay | Admitting: Adult Health

## 2024-02-18 MED ORDER — BUPROPION HCL ER (SR) 100 MG PO TB12: 200.0000 mg | ORAL_TABLET | Freq: Every morning | ORAL | 1 refills | Status: DC

## 2024-03-09 ENCOUNTER — Other Ambulatory Visit: Payer: Self-pay | Admitting: Adult Health

## 2024-03-21 ENCOUNTER — Other Ambulatory Visit: Payer: Self-pay | Admitting: Adult Health

## 2024-03-21 DIAGNOSIS — I10 Essential (primary) hypertension: Secondary | ICD-10-CM

## 2024-05-12 ENCOUNTER — Other Ambulatory Visit: Payer: Self-pay | Admitting: Adult Health

## 2024-05-12 MED ORDER — LOVASTATIN 40 MG PO TABS: 40.0000 mg | ORAL_TABLET | Freq: Every day | ORAL | 1 refills | Status: DC

## 2024-06-01 ENCOUNTER — Other Ambulatory Visit: Payer: Self-pay | Admitting: Adult Health

## 2024-06-01 DIAGNOSIS — K649 Unspecified hemorrhoids: Secondary | ICD-10-CM

## 2024-06-15 ENCOUNTER — Other Ambulatory Visit: Payer: Self-pay | Admitting: Adult Health

## 2024-06-15 DIAGNOSIS — M170 Bilateral primary osteoarthritis of knee: Secondary | ICD-10-CM

## 2024-06-19 ENCOUNTER — Encounter: Payer: Self-pay | Admitting: Adult Health

## 2024-06-19 NOTE — Telephone Encounter (Signed)
 FYI

## 2024-07-02 ENCOUNTER — Telehealth: Admitting: Adult Health

## 2024-07-02 DIAGNOSIS — J0140 Acute pansinusitis, unspecified: Secondary | ICD-10-CM

## 2024-07-02 MED ORDER — AMOXICILLIN-POT CLAVULANATE 875-125 MG PO TABS: 1.0000 | ORAL_TABLET | Freq: Two times a day (BID) | ORAL | 0 refills | Status: DC

## 2024-07-02 NOTE — Progress Notes (Signed)
 Virtual Visit via Video Note  I connected with [facility] on 07/02/24 at  2:30 PM EDT by a video enabled telemedicine application and verified that I am speaking with the correct person using two identifiers.  Location patient: home Location provider:work or home office Persons participating in the virtual visit: patient, provider  I discussed the limitations of evaluation and management by telemedicine and the availability of in person appointments. The patient expressed understanding and agreed to proceed.   HPI: 58 year old male who  has a past medical history of ALLERGIC RHINITIS, ANXIETY, ASTHMA, BACK PAIN, ERECTILE DYSFUNCTION, GERD, HYPERLIPIDEMIA, HYPERTENSION, LUMBAR STRAIN, NECK PAIN, PANCREATITIS, HX OF, PONV (postoperative nausea and vomiting), Seasonal allergies, SHOULDER PAIN, LEFT, Substance abuse (HCC), and TENDINITIS, PATELLAR.  He is being evaluated today for an acute issue. He reports that for over a week he has had sinus pain/pressure, headaches, nasal congestion, rhinorrhea, and a productive cough with green mucus. He reports that about two months ago he was seen by his dentist due to tooth pain and was told he had a sinus infection - he was prescribed a prednisone  taper but no antibiotics.   He denies fevers or chills but feels acutely ill.    ROS: See pertinent positives and negatives per HPI.  Past Medical History:  Diagnosis Date   ALLERGIC RHINITIS    ANXIETY    ASTHMA    BACK PAIN    ERECTILE DYSFUNCTION    GERD    HYPERLIPIDEMIA    HYPERTENSION    LUMBAR STRAIN    NECK PAIN    PANCREATITIS, HX OF    PONV (postoperative nausea and vomiting)    nausea only    Seasonal allergies    SHOULDER PAIN, LEFT    Substance abuse (HCC)    quit in 2007   TENDINITIS, PATELLAR     Past Surgical History:  Procedure Laterality Date   IMAGE GUIDED SINUS SURGERY N/A 04/13/2015   Procedure: IMAGE GUIDED SINUS SURGERY;  Surgeon: Carolee Hunter, MD;  Location:  [facility];  Service: ENT;  Laterality: N/A;  GAVE DISK TO CE CE   KNEE ARTHROSCOPY Right 2008   KNEE BURSECTOMY Right 11/24/2021   Procedure: right knee bursectomy;  Surgeon: Addie Cordella Hamilton, MD;  Location: [facility] OR;  Service: Orthopedics;  Laterality: Right;   KNEE SURGERY Left    x2   MAXILLARY ANTROSTOMY Bilateral 04/13/2015   Procedure: MAXILLARY ANTROSTOMY;  Surgeon: Carolee Hunter, MD;  Location: [facility];  Service: ENT;  Laterality: Bilateral;    Family History  Problem Relation Age of Onset   Lung disease Father    Melanoma Mother    Heart attack Brother    Colon polyps Neg Hx    Colon cancer Neg Hx    Stomach cancer Neg Hx    Pancreatic cancer Neg Hx    Esophageal cancer Neg Hx        Current Outpatient Medications:    Artificial Tear Solution (SOOTHE XP) SOLN, Place 1 drop into both eyes daily., Disp: , Rfl:    buPROPion  ER (WELLBUTRIN  SR) 100 MG 12 hr tablet, Take 2 tablets (200 mg total) by mouth every morning., Disp: 180 tablet, Rfl: 1   clotrimazole -betamethasone  (LOTRISONE ) cream, Apply 1 Application topically daily., Disp: 30 g, Rfl: 0   cyclobenzaprine  (FLEXERIL ) 10 MG tablet, TAKE 1 TABLET(10 MG) BY MOUTH AT BEDTIME, Disp: 90 tablet, Rfl: 1   fluticasone  (FLONASE ) 50 MCG/ACT nasal spray, Place 2 sprays into  both nostrils daily., Disp: 16 g, Rfl: 0   gabapentin  (NEURONTIN ) 300 MG capsule, Take 1 capsule as needed by oral route in the evening for 30 days., Disp: , Rfl:    hydrocortisone  (ANUSOL -HC) 2.5 % rectal cream, APPLY RECTALLY TO THE AFFECTED AREA TWICE DAILY, Disp: 30 g, Rfl: 0   losartan  (COZAAR ) 100 MG tablet, TAKE 1 TABLET(100 MG) BY MOUTH DAILY, Disp: 90 tablet, Rfl: 0   meloxicam  (MOBIC ) 15 MG tablet, TAKE 1 TABLET BY MOUTH DAILY AS NEEDED FOR PAIN, Disp: 90 tablet, Rfl: 1   metFORMIN  (GLUCOPHAGE ) 500 MG tablet, Take 1 tablet (500 mg total) by mouth daily., Disp: 90 tablet, Rfl: 1   Multiple Vitamin (MULTIVITAMIN WITH MINERALS) TABS  tablet, Take 1 tablet by mouth daily., Disp: , Rfl:    Omega-3 Fatty Acids (FISH OIL) 1200 MG CAPS, , Disp: , Rfl:    omeprazole  (PRILOSEC) 20 MG capsule, TAKE 1 CAPSULE BY MOUTH DAILY, Disp: 90 capsule, Rfl: 3   Probiotic Product (PROBIOTIC DAILY PO), , Disp: , Rfl:    rosuvastatin  (CRESTOR ) 20 MG tablet, Take 1 tablet (20 mg total) by mouth daily., Disp: 90 tablet, Rfl: 3   sildenafil  (VIAGRA ) 100 MG tablet, TAKE 1 TABLET BY MOUTH EVERY DAY AS NEEDED FOR ERECTILE DYSFUNCTION, Disp: 15 tablet, Rfl: 1   Turmeric (QC TUMERIC COMPLEX) 500 MG CAPS, , Disp: , Rfl:   EXAM:  VITALS per patient if applicable:  GENERAL: alert, oriented, appears well and in no acute distress  HEENT: atraumatic, conjunttiva clear, no obvious abnormalities on inspection of external nose and ears  NECK: normal movements of the head and neck  LUNGS: on inspection no signs of respiratory distress, breathing rate appears normal, no obvious gross SOB, gasping or wheezing  CV: no obvious cyanosis  MS: moves all visible extremities without noticeable abnormality  PSYCH/NEURO: pleasant and cooperative, no obvious depression or anxiety, speech and thought processing grossly intact  ASSESSMENT AND PLAN:  Discussed the following assessment and plan:  1. Acute non-recurrent pansinusitis (Primary) - Will treat for suspected sinusitis with Augmentin   - Follow up if no improvement in the next week         I discussed the assessment and treatment plan with the patient. The patient was provided an opportunity to ask questions and all were answered. The patient agreed with the plan and demonstrated an understanding of the instructions.   The patient was advised to call back or seek an in-person evaluation if the symptoms worsen or if the condition fails to improve as anticipated.   Jazzlynn Rawe, NP

## 2024-07-05 ENCOUNTER — Other Ambulatory Visit: Payer: Self-pay | Admitting: Adult Health

## 2024-07-05 DIAGNOSIS — I10 Essential (primary) hypertension: Secondary | ICD-10-CM

## 2024-07-05 DIAGNOSIS — R7303 Prediabetes: Secondary | ICD-10-CM

## 2024-08-23 ENCOUNTER — Other Ambulatory Visit: Payer: Self-pay | Admitting: Adult Health

## 2024-09-01 ENCOUNTER — Ambulatory Visit: Admitting: Internal Medicine

## 2024-09-01 ENCOUNTER — Encounter: Payer: Self-pay | Admitting: Internal Medicine

## 2024-09-01 DIAGNOSIS — J0141 Acute recurrent pansinusitis: Secondary | ICD-10-CM

## 2024-09-01 MED ORDER — AMOXICILLIN-POT CLAVULANATE 875-125 MG PO TABS: 1.0000 | ORAL_TABLET | Freq: Two times a day (BID) | ORAL | 0 refills | Status: DC

## 2024-09-01 NOTE — Progress Notes (Signed)
 "  Chief Complaint  Patient presents with   Sinus Problem    Pt c/o pressure on facial, eyes. Nasal congestion. A little pressure for forehead. No teeth pain. Denied fever, sore throat. Little dry cough. Sx started on Friday. Taking mucinex. Seem to help a little bit. Sx seems like its lingering. When blow out nose, brown to yellow green mucus.     HPI: [name] 58 y.o. come in for sda    onset 4 days ago  Above sx  after  cutting  pre finished flooring  Carpenter work  HX OF SINUS SURGERY 2016  helped  at the time  Non smoker   Last sinus infection  a few months ago and better  Now  inc drainage  and facial pressure as in past  No reg  saline washes .    ROS: See pertinent positives and negatives per HPI.  Past Medical History:  Diagnosis Date   ALLERGIC RHINITIS    ANXIETY    ASTHMA    BACK PAIN    ERECTILE DYSFUNCTION    GERD    HYPERLIPIDEMIA    HYPERTENSION    LUMBAR STRAIN    NECK PAIN    PANCREATITIS, HX OF    PONV (postoperative nausea and vomiting)    nausea only    Seasonal allergies    SHOULDER PAIN, LEFT    Substance abuse (HCC)    quit in 2007   TENDINITIS, PATELLAR     Family History  Problem Relation Age of Onset   Lung disease Father    Melanoma Mother    Heart attack Brother    Colon polyps Neg Hx    Colon cancer Neg Hx    Stomach cancer Neg Hx    Pancreatic cancer Neg Hx    Esophageal cancer Neg Hx     Social History   Socioeconomic History   Marital status: Married    Spouse name: Not on file   Number of children: Not on file   Years of education: Not on file   Highest education level: Not on file  Occupational History   Not on file  Tobacco Use   Smoking status: Never   Smokeless tobacco: Current    Types: Chew   Tobacco comments:    form given 11/24/15  Vaping Use   Vaping status: Never Used  Substance and Sexual Activity   Alcohol use: No    Alcohol/week: 0.0 standard drinks of alcohol    Comment: hx alcohol abuse    Drug use: No   Sexual activity: Not on file  Other Topics Concern   Not on file  Social History Narrative   Works as a music therapist   Married to [employer]       Social Drivers of Health   Tobacco Use: High Risk (09/01/2024)   Patient History    Smoking Tobacco Use: Never    Smokeless Tobacco Use: Current    Passive Exposure: Not on file  Financial Resource Strain: Not on file  Food Insecurity: Not on file  Transportation Needs: Not on file  Physical Activity: Not on file  Stress: Not on file  Social Connections: Not on file  Depression (PHQ2-9): Low Risk (12/04/2023)   Depression (PHQ2-9)    PHQ-2 Score: 0  Alcohol Screen: Not on file  Housing: Not on file  Utilities: Not on file  Health Literacy: Not on file    Outpatient Medications Prior to Visit  Medication Sig  Dispense Refill   Artificial Tear Solution (SOOTHE XP) SOLN Place 1 drop into both eyes daily.     buPROPion  ER (WELLBUTRIN  SR) 100 MG 12 hr tablet TAKE 2 TABLETS(200 MG) BY MOUTH EVERY MORNING 60 tablet 0   clotrimazole -betamethasone  (LOTRISONE ) cream Apply 1 Application topically daily. 30 g 0   cyclobenzaprine  (FLEXERIL ) 10 MG tablet TAKE 1 TABLET(10 MG) BY MOUTH AT BEDTIME 90 tablet 1   fluticasone  (FLONASE ) 50 MCG/ACT nasal spray Place 2 sprays into both nostrils daily. 16 g 0   gabapentin  (NEURONTIN ) 300 MG capsule Take 1 capsule as needed by oral route in the evening for 30 days.     hydrocortisone  (ANUSOL -HC) 2.5 % rectal cream APPLY RECTALLY TO THE AFFECTED AREA TWICE DAILY 30 g 0   losartan  (COZAAR ) 100 MG tablet TAKE 1 TABLET(100 MG) BY MOUTH DAILY 90 tablet 1   meloxicam  (MOBIC ) 15 MG tablet TAKE 1 TABLET BY MOUTH DAILY AS NEEDED FOR PAIN 90 tablet 1   metFORMIN  (GLUCOPHAGE ) 500 MG tablet TAKE 1 TABLET(500 MG) BY MOUTH DAILY 90 tablet 1   Multiple Vitamin (MULTIVITAMIN WITH MINERALS) TABS tablet Take 1 tablet by mouth daily.     Omega-3 Fatty Acids (FISH OIL) 1200 MG CAPS      omeprazole  (PRILOSEC)  20 MG capsule TAKE 1 CAPSULE BY MOUTH DAILY 90 capsule 3   Probiotic Product (PROBIOTIC DAILY PO)      rosuvastatin  (CRESTOR ) 20 MG tablet Take 1 tablet (20 mg total) by mouth daily. 90 tablet 3   sildenafil  (VIAGRA ) 100 MG tablet TAKE 1 TABLET BY MOUTH EVERY DAY AS NEEDED FOR ERECTILE DYSFUNCTION 15 tablet 1   Turmeric (QC TUMERIC COMPLEX) 500 MG CAPS      amoxicillin -clavulanate (AUGMENTIN ) 875-125 MG tablet Take 1 tablet by mouth 2 (two) times daily. (Patient not taking: Reported on 09/01/2024) 20 tablet 0   No facility-administered medications prior to visit.     EXAM:  BP 122/80 (BP Location: Left Arm, Patient Position: Sitting, Cuff Size: Large)   Pulse 78   Temp 97.7 F (36.5 C) (Oral)   Ht 6' 1 (1.854 m)   Wt 217 lb 12.8 oz (98.8 kg)   SpO2 98%   BMI 28.74 kg/m   Body mass index is 28.74 kg/m.  GENERAL: vitals reviewed and listed above, alert, oriented, appears well hydrated and in no acute distress congested  HEENT: atraumatic, conjunctiva  clear, no obvious abnormalities on inspection of external nose and ears tm clear  nasal congested  face +- tender  OP : no lesion edema or exudate  NECK: no obvious masses on inspection palpation  LUNGS: clear to auscultation bilaterally, no wheezes, rales or rhonchi, good air movement CV: HRRR, no clubbing cyanosis or  peripheral edema nl cap refill  MS: moves all extremities without noticeable focal  abnormality PSYCH: pleasant and cooperative, no obvious depression or anxiety Lab Results  Component Value Date   WBC 4.6 12/04/2023   HGB 15.4 12/04/2023   HCT 45.1 12/04/2023   PLT 185.0 12/04/2023   GLUCOSE 122 (H) 12/04/2023   CHOL 203 (H) 12/04/2023   TRIG 119.0 12/04/2023   HDL 41.80 12/04/2023   LDLDIRECT 147.2 07/24/2012   LDLCALC 137 (H) 12/04/2023   ALT 28 12/04/2023   AST 17 12/04/2023   NA 138 12/04/2023   K 4.5 12/04/2023   CL 103 12/04/2023   CREATININE 0.91 12/04/2023   BUN 26 (H) 12/04/2023   CO2 27  12/04/2023   TSH  3.16 12/04/2023   PSA 2.85 12/04/2023   HGBA1C 6.2 12/04/2023   BP Readings from Last 3 Encounters:  09/01/24 122/80  01/07/24 (!) 130/90  12/12/23 122/76    ASSESSMENT AND PLAN:  Discussed the following assessment and plan:  Acute recurrent pansinusitis - Plan: amoxicillin -clavulanate (AUGMENTIN ) 875-125 MG tablet Hx of same  suspect underlying acute on chronic potential and environmental triggers possible. ?   -Patient advised to return or notify health care team  if  new concerns arise.  Patient Instructions  Consider  optimize sinus   hygiene.    And then if needed can add  antibiotic   Consider seeing ent  and  mask protection when appropriate .    Smita Lesh K. Jaimen Melone M.D. "

## 2024-09-01 NOTE — Patient Instructions (Addendum)
 Consider  optimize sinus   hygiene.    And then if needed can add  antibiotic   Consider seeing ent  and  mask protection when appropriate .

## 2024-09-07 ENCOUNTER — Other Ambulatory Visit: Payer: Self-pay | Admitting: Adult Health

## 2024-09-07 DIAGNOSIS — N529 Male erectile dysfunction, unspecified: Secondary | ICD-10-CM

## 2024-09-14 ENCOUNTER — Encounter: Payer: Self-pay | Admitting: Adult Health

## 2024-09-16 ENCOUNTER — Encounter: Payer: Self-pay | Admitting: Adult Health

## 2024-09-16 ENCOUNTER — Ambulatory Visit: Admitting: Adult Health

## 2024-09-16 VITALS — BP 120/70 | HR 96 | Temp 98.5°F | Ht 73.0 in | Wt 217.0 lb

## 2024-09-16 DIAGNOSIS — G5761 Lesion of plantar nerve, right lower limb: Secondary | ICD-10-CM | POA: Diagnosis not present

## 2024-09-16 MED ORDER — MELOXICAM 15 MG PO TABS: 15.0000 mg | ORAL_TABLET | Freq: Every day | ORAL | 1 refills | Status: AC | PRN

## 2024-09-16 NOTE — Progress Notes (Signed)
 "  Subjective:    Patient ID: [name], male    DOB: 1966-08-25, 59 y.o.   MRN: [id number]  HPI  Discussed the use of AI scribe software for clinical note transcription with the patient, who gave verbal consent to proceed.  History of Present Illness   [name] is a 59 year old male who presents with a toenail issue and possible Morton's neuroma in the right foot.   He has a sensation of walking on stones on the bottom of the right foot  The left foot is unaffected. The onset of these foot symptoms followed a morning walk with his dogs when he had back pain with radiation to the foot. Symptoms fluctuate with intermittent flare-ups. He often removes his shoes thinking there is gravel inside but finds none.       Review of Systems See HPI   Past Medical History:  Diagnosis Date   ALLERGIC RHINITIS    ANXIETY    ASTHMA    BACK PAIN    ERECTILE DYSFUNCTION    GERD    HYPERLIPIDEMIA    HYPERTENSION    LUMBAR STRAIN    NECK PAIN    PANCREATITIS, HX OF    PONV (postoperative nausea and vomiting)    nausea only    Seasonal allergies    SHOULDER PAIN, LEFT    Substance abuse (HCC)    quit in 2007   TENDINITIS, PATELLAR     Social History   Socioeconomic History   Marital status: Married    Spouse name: Not on file   Number of children: Not on file   Years of education: Not on file   Highest education level: Not on file  Occupational History   Not on file  Tobacco Use   Smoking status: Never   Smokeless tobacco: Current    Types: Chew   Tobacco comments:    form given 11/24/15  Vaping Use   Vaping status: Never Used  Substance and Sexual Activity   Alcohol use: No    Alcohol/week: 0.0 standard drinks of alcohol    Comment: hx alcohol abuse   Drug use: No   Sexual activity: Not on file  Other Topics Concern   Not on file  Social History Narrative   Works as a music therapist   Married to [employer]       Social Drivers of Health   Tobacco Use:  High Risk (09/16/2024)   Patient History    Smoking Tobacco Use: Never    Smokeless Tobacco Use: Current    Passive Exposure: Not on file  Financial Resource Strain: Not on file  Food Insecurity: Not on file  Transportation Needs: Not on file  Physical Activity: Not on file  Stress: Not on file  Social Connections: Not on file  Intimate Partner Violence: Not on file  Depression (PHQ2-9): Low Risk (09/16/2024)   Depression (PHQ2-9)    PHQ-2 Score: 0  Alcohol Screen: Not on file  Housing: Not on file  Utilities: Not on file  Health Literacy: Not on file    Past Surgical History:  Procedure Laterality Date   IMAGE GUIDED SINUS SURGERY N/A 04/13/2015   Procedure: IMAGE GUIDED SINUS SURGERY;  Surgeon: Carolee Hunter, MD;  Location: [facility];  Service: ENT;  Laterality: N/A;  GAVE DISK TO CE [facility]   KNEE ARTHROSCOPY Right 2008   KNEE BURSECTOMY Right 11/24/2021   Procedure: right knee bursectomy;  Surgeon: Addie Cordella Hamilton, MD;  Location: [facility] OR;  Service: Orthopedics;  Laterality: Right;   KNEE SURGERY Left    x2   MAXILLARY ANTROSTOMY Bilateral 04/13/2015   Procedure: MAXILLARY ANTROSTOMY;  Surgeon: Carolee Hunter, MD;  Location: [facility];  Service: ENT;  Laterality: Bilateral;    Family History  Problem Relation Age of Onset   Lung disease Father    Melanoma Mother    Heart attack Brother    Colon polyps Neg Hx    Colon cancer Neg Hx    Stomach cancer Neg Hx    Pancreatic cancer Neg Hx    Esophageal cancer Neg Hx     Allergies[1]  Medications Ordered Prior to Encounter[2]  BP 120/70   Pulse 96   Temp 98.5 F (36.9 C) (Oral)   Ht 6' 1 (1.854 m)   Wt 217 lb (98.4 kg)   SpO2 95%   BMI 28.63 kg/m       Objective:   Physical Exam Vitals and nursing note reviewed.  Constitutional:      Appearance: Normal appearance.  Cardiovascular:     Pulses:          Dorsalis pedis pulses are 2+ on the right side.       Posterior tibial pulses are 2+ on  the right side.  Musculoskeletal:     Right foot: Normal range of motion. No deformity.  Feet:     Right foot:     Skin integrity: Skin integrity normal.  Skin:    General: Skin is warm and dry.     Capillary Refill: Capillary refill takes less than 2 seconds.  Neurological:     General: No focal deficit present.     Mental Status: He is alert and oriented to person, place, and time.  Psychiatric:        Mood and Affect: Mood normal.        Behavior: Behavior normal.        Thought Content: Thought content normal.        Judgment: Judgment normal.       Assessment & Plan:  Assessment and Plan    Morton's neuroma of right foot Morton's neuroma causing sensation of walking on pebbles. Conservative management options discussed. - Referred to podiatry for further evaluation and management. - Advised rest, ice, and anti-inflammatory medications - Recommended using a frozen water bottle to roll under the foot for relief.       Donovyn Guidice, NP     [1]  Allergies Allergen Reactions   Aspirin  Nausea And Vomiting  [2]  Current Outpatient Medications on File Prior to Visit  Medication Sig Dispense Refill   Artificial Tear Solution (SOOTHE XP) SOLN Place 1 drop into both eyes daily.     buPROPion  ER (WELLBUTRIN  SR) 100 MG 12 hr tablet TAKE 2 TABLETS(200 MG) BY MOUTH EVERY MORNING 60 tablet 0   clotrimazole -betamethasone  (LOTRISONE ) cream Apply 1 Application topically daily. 30 g 0   cyclobenzaprine  (FLEXERIL ) 10 MG tablet TAKE 1 TABLET(10 MG) BY MOUTH AT BEDTIME 90 tablet 1   fluticasone  (FLONASE ) 50 MCG/ACT nasal spray Place 2 sprays into both nostrils daily. 16 g 0   gabapentin  (NEURONTIN ) 300 MG capsule Take 1 capsule as needed by oral route in the evening for 30 days.     hydrocortisone  (ANUSOL -HC) 2.5 % rectal cream APPLY RECTALLY TO THE AFFECTED AREA TWICE DAILY 30 g 0   losartan  (COZAAR ) 100 MG tablet TAKE 1 TABLET(100 MG) BY MOUTH DAILY 90 tablet  1   metFORMIN   (GLUCOPHAGE ) 500 MG tablet TAKE 1 TABLET(500 MG) BY MOUTH DAILY 90 tablet 1   Multiple Vitamin (MULTIVITAMIN WITH MINERALS) TABS tablet Take 1 tablet by mouth daily.     Omega-3 Fatty Acids (FISH OIL) 1200 MG CAPS      omeprazole  (PRILOSEC) 20 MG capsule TAKE 1 CAPSULE BY MOUTH DAILY 90 capsule 3   Probiotic Product (PROBIOTIC DAILY PO)      rosuvastatin  (CRESTOR ) 20 MG tablet Take 1 tablet (20 mg total) by mouth daily. 90 tablet 3   sildenafil  (VIAGRA ) 100 MG tablet TAKE 1 TABLET BY MOUTH EVERY DAY AS NEEDED FOR ERECTILE DYSFUNCTION 15 tablet 1   Turmeric (QC TUMERIC COMPLEX) 500 MG CAPS      No current facility-administered medications on file prior to visit.   "

## 2024-09-25 ENCOUNTER — Other Ambulatory Visit: Payer: Self-pay | Admitting: Adult Health

## 2024-09-25 ENCOUNTER — Ambulatory Visit: Admitting: Adult Health

## 2024-09-25 ENCOUNTER — Encounter: Payer: Self-pay | Admitting: Adult Health

## 2024-09-25 VITALS — BP 120/86 | HR 90 | Temp 98.6°F | Ht 73.0 in | Wt 220.0 lb

## 2024-09-25 DIAGNOSIS — R011 Cardiac murmur, unspecified: Secondary | ICD-10-CM | POA: Diagnosis not present

## 2024-09-25 DIAGNOSIS — J019 Acute sinusitis, unspecified: Secondary | ICD-10-CM | POA: Diagnosis not present

## 2024-09-25 DIAGNOSIS — B9689 Other specified bacterial agents as the cause of diseases classified elsewhere: Secondary | ICD-10-CM | POA: Diagnosis not present

## 2024-09-25 MED ORDER — AMOXICILLIN-POT CLAVULANATE 875-125 MG PO TABS: 1.0000 | ORAL_TABLET | Freq: Two times a day (BID) | ORAL | 0 refills | Status: AC

## 2024-09-25 NOTE — Progress Notes (Signed)
" ° °  Acute Office Visit  Subjective:     Patient ID: [name], male    DOB: 07/12/1966, 59 y.o.   MRN: [id number]  Chief Complaint  Patient presents with   Facial Pain   Cough     Patient is in today for 2 day history of facial pressure and dry cough. He reports that he has history of recurrent sinus infections and has been treated for same 3-4 times since July 2025. He also reports he had sinus surgery in 2016 and symptoms are feeling similar to the symptoms he was having before his surgery. He denies nasal congestion, rhinorrhea, or fever. He has been using Mucinex and Flonase  with no symptom relief.   Review of Systems  Respiratory:  Positive for cough.   See HPI     Objective:    BP 120/86   Pulse 90   Temp 98.6 F (37 C) (Oral)   Ht 6' 1 (1.854 m)   Wt 99.8 kg   SpO2 94%   BMI 29.03 kg/m    Physical Exam Vitals reviewed.  Constitutional:      General: He is not in acute distress.    Appearance: Normal appearance. He is not ill-appearing.  HENT:     Head: Normocephalic and atraumatic.     Right Ear: Tympanic membrane normal.     Left Ear: Tympanic membrane normal.     Nose: No congestion or rhinorrhea.     Right Sinus: Maxillary sinus tenderness present. No frontal sinus tenderness.     Left Sinus: Maxillary sinus tenderness present. No frontal sinus tenderness.     Comments: Maxillary sinus pressure does not worsen with palpation     Mouth/Throat:     Mouth: Mucous membranes are moist.     Pharynx: Oropharynx is clear. No posterior oropharyngeal erythema.  Eyes:     Pupils: Pupils are equal, round, and reactive to light.  Cardiovascular:     Rate and Rhythm: Normal rate and regular rhythm.     Heart sounds: Murmur heard.     Systolic murmur is present with a grade of 2/6.  Pulmonary:     Effort: Pulmonary effort is normal.     Breath sounds: Normal breath sounds.  Musculoskeletal:        General: Normal range of motion.     Cervical back:  Normal range of motion.  Skin:    General: Skin is warm and dry.  Neurological:     Mental Status: He is alert and oriented to person, place, and time.  Psychiatric:        Mood and Affect: Mood normal.        Behavior: Behavior normal.        Assessment & Plan:   1. Acute bacterial sinusitis (Primary) - Continue Flonase  - Augmentin  875 mg125 mg bid x 10 days - CT sinuses for recurrent sinusitis  2. Murmur, cardiac - Echocardiogram - Will refer to cardiology if needed  Debby CHRISTELLA Borer, RN FNP Student   "

## 2024-09-25 NOTE — Addendum Note (Signed)
 Addended by: MERNA DARLEENE CROME on: 09/25/2024 10:01 AM   Modules accepted: Orders

## 2024-10-06 ENCOUNTER — Telehealth: Payer: Self-pay

## 2024-10-06 ENCOUNTER — Other Ambulatory Visit

## 2024-10-06 ENCOUNTER — Other Ambulatory Visit: Payer: Self-pay | Admitting: Adult Health

## 2024-10-06 MED ORDER — DIAZEPAM 5 MG PO TABS: ORAL_TABLET | ORAL | 1 refills | Status: AC

## 2024-10-06 NOTE — Telephone Encounter (Signed)
 Copied from CRM #[id number]. Topic: Clinical - Medication Question >> Oct 05, 2024  3:37 PM [name] wrote: Reason for CRM: pt called because he is having a ct scan done tomorrow 10/05/24 and he is claustrophobic. He wanted to know if pcp could temp prescribe VALIUM  to pick up today

## 2024-10-07 ENCOUNTER — Ambulatory Visit (HOSPITAL_COMMUNITY)
Admission: RE | Admit: 2024-10-07 | Discharge: 2024-10-07 | Disposition: A | Discharge status: Home / Self Care | Source: Ambulatory Visit | Attending: Cardiovascular Disease | Admitting: Cardiovascular Disease

## 2024-10-07 DIAGNOSIS — R011 Cardiac murmur, unspecified: Secondary | ICD-10-CM | POA: Insufficient documentation

## 2024-10-07 LAB — ECHOCARDIOGRAM COMPLETE
Area-P 1/2: 4.8 cm2
S' Lateral: 2.5 cm

## 2024-10-08 ENCOUNTER — Ambulatory Visit: Payer: Self-pay | Admitting: Adult Health

## 2024-10-08 DIAGNOSIS — B9689 Other specified bacterial agents as the cause of diseases classified elsewhere: Secondary | ICD-10-CM

## 2024-10-09 ENCOUNTER — Other Ambulatory Visit: Payer: Self-pay | Admitting: Adult Health

## 2024-10-09 ENCOUNTER — Ambulatory Visit
Admission: RE | Admit: 2024-10-09 | Discharge: 2024-10-09 | Disposition: A | Source: Ambulatory Visit | Attending: Adult Health

## 2024-10-09 DIAGNOSIS — I10 Essential (primary) hypertension: Secondary | ICD-10-CM

## 2024-10-09 DIAGNOSIS — B9689 Other specified bacterial agents as the cause of diseases classified elsewhere: Secondary | ICD-10-CM

## 2024-10-14 ENCOUNTER — Ambulatory Visit: Admitting: Podiatry

## 2024-10-14 ENCOUNTER — Ambulatory Visit (INDEPENDENT_AMBULATORY_CARE_PROVIDER_SITE_OTHER)

## 2024-10-14 ENCOUNTER — Encounter: Payer: Self-pay | Admitting: Podiatry

## 2024-10-14 DIAGNOSIS — G5761 Lesion of plantar nerve, right lower limb: Secondary | ICD-10-CM | POA: Diagnosis not present

## 2024-10-14 DIAGNOSIS — M7751 Other enthesopathy of right foot: Secondary | ICD-10-CM

## 2024-10-14 DIAGNOSIS — M779 Enthesopathy, unspecified: Secondary | ICD-10-CM

## 2024-10-14 MED ORDER — TRIAMCINOLONE ACETONIDE 10 MG/ML IJ SUSP
10.0000 mg | Freq: Once | INTRAMUSCULAR | Status: AC
  Administered 2024-10-14: 10 mg via INTRA_ARTICULAR

## 2024-10-15 NOTE — Progress Notes (Signed)
 Subjective:   Patient ID: [name], male   DOB: 59 y.o.   MRN: [id number]   HPI Patient states over the last year he has developed more discomfort around his big toe joint right and states that he had problems with a disc in his back and he is not sure if it may be nerve pain.  States it seems to be more related to shoe gear and pressure and types of activities he does and patient does not smoke and is very active   Review of Systems  All other systems reviewed and are negative.       Objective:  Physical Exam Vitals and nursing note reviewed.  Constitutional:      Appearance: He is well-developed.  Pulmonary:     Effort: Pulmonary effort is normal.  Musculoskeletal:        General: Normal range of motion.  Skin:    General: Skin is warm.  Neurological:     Mental Status: He is alert.     Neurovascular status found to be intact muscle strength found to be adequate range of motion adequate with no loss of motion of the first MPJ right or crepitus.  There does not appear to be a prominence on the lateral side of the first metatarsal that when pressed seems to create discomfort inflammation and possible nerve irritation.  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  Patient will be seen back in 1 month we will judge results and decide what is gena be necessary long-term  X-rays indicate there is a spur of the first metatarsal head slightly proximal and mostly on the lateral side with the joint itself looking relatively healthy.

## 2024-11-11 ENCOUNTER — Ambulatory Visit: Admitting: Podiatry
